# Patient Record
Sex: Male | Born: 1970 | Race: Black or African American | Hispanic: No | Marital: Single | Smoking: Former smoker
Health system: Southern US, Community
[De-identification: ages and names within clinical notes are randomized; demographics above are authoritative.]

## PROBLEM LIST (undated history)

## (undated) DIAGNOSIS — C8469 Anaplastic large cell lymphoma, ALK-positive, extranodal and solid organ sites: Secondary | ICD-10-CM

## (undated) DIAGNOSIS — C8475 Anaplastic large cell lymphoma, ALK-negative, lymph nodes of inguinal region and lower limb: Principal | ICD-10-CM

## (undated) DIAGNOSIS — I10 Essential (primary) hypertension: Secondary | ICD-10-CM

## (undated) DIAGNOSIS — E785 Hyperlipidemia, unspecified: Secondary | ICD-10-CM

## (undated) DIAGNOSIS — Z113 Encounter for screening for infections with a predominantly sexual mode of transmission: Secondary | ICD-10-CM

## (undated) DIAGNOSIS — W3400XA Accidental discharge from unspecified firearms or gun, initial encounter: Secondary | ICD-10-CM

## (undated) DIAGNOSIS — K219 Gastro-esophageal reflux disease without esophagitis: Secondary | ICD-10-CM

## (undated) DIAGNOSIS — M5442 Lumbago with sciatica, left side: Secondary | ICD-10-CM

## (undated) DIAGNOSIS — C8449 Peripheral T-cell lymphoma, not classified, extranodal and solid organ sites: Secondary | ICD-10-CM

## (undated) DIAGNOSIS — G8929 Other chronic pain: Secondary | ICD-10-CM

## (undated) DIAGNOSIS — I70213 Atherosclerosis of native arteries of extremities with intermittent claudication, bilateral legs: Secondary | ICD-10-CM

## (undated) HISTORY — PX: COLON RESECTION: SHX5231

---

## 2014-10-31 ENCOUNTER — Inpatient Hospital Stay: Admit: 2014-10-31 | Payer: MEDICARE | Primary: Internal Medicine

## 2014-10-31 ENCOUNTER — Ambulatory Visit
Admit: 2014-10-31 | Discharge: 2014-10-31 | Payer: PRIVATE HEALTH INSURANCE | Attending: Internal Medicine | Primary: Internal Medicine

## 2014-10-31 DIAGNOSIS — M5441 Lumbago with sciatica, right side: Secondary | ICD-10-CM

## 2014-10-31 DIAGNOSIS — G8929 Other chronic pain: Secondary | ICD-10-CM

## 2014-10-31 MED ORDER — HYDROCODONE-ACETAMINOPHEN 7.5 MG-325 MG TAB
ORAL_TABLET | Freq: Three times a day (TID) | ORAL | Status: DC | PRN
Start: 2014-10-31 — End: 2014-10-31

## 2014-10-31 MED ORDER — HYDROCODONE-ACETAMINOPHEN 7.5 MG-325 MG TAB
ORAL_TABLET | Freq: Three times a day (TID) | ORAL | Status: DC | PRN
Start: 2014-10-31 — End: 2015-02-06

## 2014-10-31 MED ORDER — CYCLOBENZAPRINE 10 MG TAB
10 mg | ORAL_TABLET | Freq: Three times a day (TID) | ORAL | Status: DC
Start: 2014-10-31 — End: 2014-10-31

## 2014-10-31 MED ORDER — CYCLOBENZAPRINE 10 MG TAB
10 mg | ORAL_TABLET | Freq: Three times a day (TID) | ORAL | Status: DC
Start: 2014-10-31 — End: 2015-02-12

## 2014-10-31 MED ORDER — OMEPRAZOLE 40 MG CAP, DELAYED RELEASE
40 mg | ORAL_CAPSULE | Freq: Every day | ORAL | Status: DC
Start: 2014-10-31 — End: 2015-06-12

## 2014-10-31 NOTE — Progress Notes (Signed)
Patient is in the office today to establish care.  Patient states he has been going to the ED to get Toradol injections however the paper that was handed to this nurse was dated 01/04/2014. Patient states he also takes to much tylenol. Patient states was taking Vicodin but this paper was dated 12/22/2010.       Do you have an Advance Directive no  Do you want more information: information given    1. Have you been to the ER, urgent care clinic since your last visit?  Hospitalized since your last visit?yes, [facility] in [place]    2. Have you seen or consulted any other health care providers outside of the [facility] since your last visit?  Include any pap smears or colon screening. No
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
## 2014-10-31 NOTE — Patient Instructions (Addendum)
Advance Directives: Care Instructions  Your Care Instructions  An advance directive is a legal way to state your wishes at the end of your life. It tells your family and your doctor what to do if you can no longer say what you want.  There are two main types of advance directives. You can change them any time that your wishes change.  ?? A living will tells your family and your doctor your wishes about life support and other treatment.  ?? A medical power of attorney lets you name a person to make treatment decisions for you when you can't speak for yourself. This person is called a health care agent.  If you do not have an advance directive, decisions about your medical care may be made by a doctor or a judge who doesn't know you.  It may help to think of an advance directive as a gift to the people who care for you. If you have one, they won't have to make tough decisions by themselves.  Follow-up care is a key part of your treatment and safety. Be sure to make and go to all appointments, and call your doctor if you are having problems. It's also a good idea to know your test results and keep a list of the medicines you take.  How can you care for yourself at home?  ?? Discuss your wishes with your loved ones and your doctor. This way, there are no surprises.  ?? Many states have a unique form. Or you might use a universal form that has been approved by many states. This kind of form can sometimes be completed and stored online. Your electronic copy will then be available wherever you have a connection to the Internet. In most cases, doctors will respect your wishes even if you have a form from a different state.  ?? You don't need a lawyer to do an advance directive. But you may want to get legal advice.  ?? Think about these questions when you prepare an advance directive:  ?? Who do you want to make decisions about your medical care if you are not able to? Many people choose a family member, close friend, or doctor.   ?? Do you know enough about life support methods that might be used? If not, talk to your doctor so you understand.  ?? What are you most afraid of that might happen? You might be afraid of having pain, losing your independence, or being kept alive by machines.  ?? Where would you prefer to die? Choices include your home, a hospital, or a nursing home.  ?? Would you like to have information about hospice care to support you and your family?  ?? Do you want to donate organs when you die?  ?? Do you want certain religious practices performed before you die? If so, put your wishes in the advance directive.  ?? Read your advance directive every year, and make changes as needed.  When should you call for help?  Be sure to contact your doctor if you have any questions.  Where can you learn more?  Go to [email or web address]  Enter R264 in the search box to learn more about "Advance Directives: Care Instructions."  ?? 2006-2016 [employer]. Care instructions adapted under license by [employer] (which disclaims liability or warranty for this information). This care instruction is for use with your licensed healthcare professional. If you have questions about a medical condition or this instruction, always ask your  healthcare professional. Healthwise, Incorporated disclaims any warranty or liability for your use of this information.  Content Version: 10.9.538570; Current as of: May 21, 2014

## 2014-11-01 LAB — METABOLIC PANEL, COMPREHENSIVE
A-G Ratio: 1 (ref 0.8–1.7)
ALT (SGPT): 27 U/L (ref 16–61)
AST (SGOT): 17 U/L (ref 15–37)
Albumin: 3.8 g/dL (ref 3.4–5.0)
Alk. phosphatase: 78 U/L (ref 45–117)
Anion gap: 10 mmol/L (ref 3.0–18)
BUN/Creatinine ratio: 11 — ABNORMAL LOW (ref 12–20)
BUN: 14 MG/DL (ref 7.0–18)
Bilirubin, total: 0.2 MG/DL (ref 0.2–1.0)
CO2: 19 mmol/L — ABNORMAL LOW (ref 21–32)
Calcium: 9.2 MG/DL (ref 8.5–10.1)
Chloride: 110 mmol/L — ABNORMAL HIGH (ref 100–108)
Creatinine: 1.26 MG/DL (ref 0.6–1.3)
GFR est AA: >60 mL/min/{1.73_m2} (ref >60)
GFR est non-AA: >60 mL/min/{1.73_m2} (ref >60)
Globulin: 3.9 g/dL (ref 2.0–4.0)
Glucose: 73 mg/dL — ABNORMAL LOW (ref 74–99)
Potassium: 4.5 mmol/L (ref 3.5–5.5)
Protein, total: 7.7 g/dL (ref 6.4–8.2)
Sodium: 139 mmol/L (ref 136–145)

## 2014-11-07 LAB — 13-DRUG SCREEN, UR
Amphetamines, urine: NEGATIVE ng/mL
Barbiturates: NEGATIVE ng/mL
Benzodiazepines: NEGATIVE ng/mL
Cocaine metabolites: NEGATIVE ng/mL
Creatinine, urine: 94.1 mg/dL (ref 20.0–300.0)
Meperidine: NEGATIVE ng/mL
Methadone: NEGATIVE ng/mL
Opiates: NEGATIVE ng/mL
Oxycodone/Oxymorphone, urine: NEGATIVE ng/mL
Phencyclidine: NEGATIVE ng/mL
Propoxyphene: NEGATIVE ng/mL
Tramadol: NEGATIVE ng/mL

## 2014-11-07 LAB — CANNABINOIDS CONFIRM
Cannabinoids confirm, urine: POSITIVE — AB
Carboxy THC GC/MS confirm: 168 ng/mL

## 2014-11-07 LAB — ETHANOL, CONFIRM: Ethanol: 0.055 %

## 2014-11-28 ENCOUNTER — Encounter: Attending: Internal Medicine | Primary: Internal Medicine

## 2014-12-05 ENCOUNTER — Encounter: Attending: Internal Medicine | Primary: Internal Medicine

## 2014-12-31 ENCOUNTER — Encounter: Attending: Internal Medicine | Primary: Internal Medicine

## 2015-02-06 ENCOUNTER — Ambulatory Visit
Admit: 2015-02-06 | Discharge: 2015-02-06 | Payer: PRIVATE HEALTH INSURANCE | Attending: Internal Medicine | Primary: Internal Medicine

## 2015-02-06 DIAGNOSIS — G8929 Other chronic pain: Secondary | ICD-10-CM

## 2015-02-06 MED ORDER — HYDROCODONE-ACETAMINOPHEN 7.5 MG-325 MG TAB
ORAL_TABLET | Freq: Three times a day (TID) | ORAL | 0 refills | Status: DC | PRN
Start: 2015-02-06 — End: 2015-06-12

## 2015-02-06 NOTE — Progress Notes (Signed)
Patient is in the office today for a 3 week follow up.  Patient states he is having back pain 9/10.    Do you have an Advance Directive no  Do you want more information no    1. Have you been to the ER, urgent care clinic since your last visit?  Hospitalized since your last visit?No    2. Have you seen or consulted any other health care providers outside of the [facility] since your last visit?  Include any pap smears or colon screening. No

## 2015-02-06 NOTE — Progress Notes (Signed)
Dean Walker is a 44 y.o.  male and presents with Back Pain (f/u) and GERD (takes 1-2 prilosec/day )      SUBJECTIVE:  Osteoarthritis and Chronic Pain:  Patient has osteoarthritis, primarily affecting the back.   Symptoms onset: problem is longstanding.  Rheumatological ROS: using narcotic analgesics regularly daily in stable pattern with good pain control and good quality of life.   Response to treatment plan: stable. Pt ran out of Norco but he normally takes 3 tabs/day with good pain control.     Pt's GERD is well controlled Prilosec       Respiratory ROS: negative for - shortness of breath  Cardiovascular ROS: negative for - chest pain    Current Outpatient Prescriptions   Medication Sig   ??? HYDROcodone-acetaminophen (NORCO) 7.5-325 mg per tablet Take 1 Tab by mouth every eight (8) hours as needed for Pain. Max Daily Amount: 3 Tabs.   ??? omeprazole (PRILOSEC) 40 mg capsule Take 1 Cap by mouth daily.   ??? tiZANidine (ZANAFLEX) 4 mg tablet Take 1 Tab by mouth every six (6) hours as needed.     No current facility-administered medications for this visit.          OBJECTIVE:  alert, well appearing, and in no distress  Visit Vitals   ??? BP 110/60 (BP 1 Location: Left arm, BP Patient Position: Sitting)   ??? Pulse (!) 106   ??? Temp 98 ??F (36.7 ??C) (Oral)   ??? Ht 5\' 6"  (1.676 m)   ??? Wt 170 lb (77.1 kg)   ??? BMI 27.44 kg/m2      well developed and well nourished        Assessment/Plan      ICD-10-CM ICD-9-CM    1. Chronic midline low back pain with right-sided sciatica M54.41 724.2 Stable on HYDROcodone-acetaminophen (NORCO) 7.5-325 mg per tablet    G89.29 724.3 Pt again advised he cannot use marijuana and take narcotics.      338.29    2. Gastroesophageal reflux disease without esophagitis K21.9 530.81 Well controlled on Prilosec 1-2 tabs/day      Follow-up Disposition:  Return in about 3 months (around 05/09/2015).    Reviewed plan of care. Patient has provided input and agrees with goals.

## 2015-02-06 NOTE — Patient Instructions (Signed)
Learning About How to Have a Healthy Back  What causes back pain?     Back pain is often caused by overuse, strain, or injury. For example, people often hurt their backs playing sports or working in the yard, being jolted in a car accident, or lifting something too heavy.  Aging plays a part too. Your bones and muscles tend to lose strength as you age, which makes injury more likely. The spongy discs between the bones of the spine (vertebrae) may suffer from wear and tear and no longer provide enough cushion between the bones. A disc that bulges or breaks open (herniated disc) can press on nerves, causing back pain.  In some people, back pain is the result of arthritis, broken vertebrae caused by bone loss (osteoporosis), illness, or a spine problem.  Although most people have back pain at one time or another, there are steps you can take to make it less likely.  How can you have a healthy back?  Reduce stress on your back through good posture   Slumping or slouching alone may not cause low back pain. But after the back has been strained or injured, bad posture can make pain worse.  ?? Sleep in a position that maintains your back's normal curves and on a mattress that feels comfortable. Sleep on your side with a pillow between your knees, or sleep on your back with a pillow under your knees. These positions can reduce strain on your back.  ?? Stand and sit up straight. "Good posture" generally means your ears, shoulders, and hips are in a straight line.  ?? If you must stand for a long time, put one foot on a stool, ledge, or box. Switch feet every now and then.  ?? Sit in a chair that is low enough to let you place both feet flat on the floor with both knees nearly level with your hips. If your chair or desk is too high, use a footrest to raise your knees. Place a small pillow, a rolled-up towel, or a lumbar roll in the curve of your back if you need extra support.   ?? Try a kneeling chair, which helps tilt your hips forward. This takes pressure off your lower back.  ?? Try sitting on an exercise ball. It can rock from side to side, which helps keep your back loose.  ?? When driving, keep your knees nearly level with your hips. Sit straight, and drive with both hands on the steering wheel. Your arms should be in a slightly bent position.  Reduce stress on your back through careful lifting   ?? Squat down, bending at the hips and knees only. If you need to, put one knee to the floor and extend your other knee in front of you, bent at a right angle (half kneeling).  ?? Press your chest straight forward. This helps keep your upper back straight while keeping a slight arch in your low back.  ?? Hold the load as close to your body as possible, at the level of your belly button (navel).  ?? Use your feet to change direction, taking small steps.  ?? Lead with your hips as you change direction. Keep your shoulders in line with your hips as you move.  ?? Set down your load carefully, squatting with your knees and hips only.  Exercise and stretch your back   ?? Do some exercise on most days of the week, if your doctor says it is okay. You can   walk, run, swim, or cycle.  ?? Stretch your back muscles. Here are a few exercises to try:  ?? Lie on your back, and gently pull one bent knee to your chest. Put that foot back on the floor, and then pull the other knee to your chest.  ?? Do pelvic tilts. Lie on your back with your knees bent. Tighten your stomach muscles. Pull your belly button (navel) in and up toward your ribs. You should feel like your back is pressing to the floor and your hips and pelvis are slightly lifting off the floor. Hold for 6 seconds while breathing smoothly.  ?? Sit with your back flat against a wall.  ?? Keep your core muscles strong. The muscles of your back, belly (abdomen), and buttocks support your spine.  ?? Pull in your belly and imagine pulling your navel toward your spine.  Hold this for 6 seconds, then relax. Remember to keep breathing normally as you tense your muscles.  ?? Do curl-ups. Always do them with your knees bent. Keep your low back on the floor, and curl your shoulders toward your knees using a smooth, slow motion. Keep your arms folded across your chest. If this bothers your neck, try putting your hands behind your neck (not your head), with your elbows spread apart.  ?? Lie on your back with your knees bent and your feet flat on the floor. Tighten your belly muscles, and then push with your feet and raise your buttocks up a few inches. Hold this position 6 seconds as you continue to breathe normally, then lower yourself slowly to the floor. Repeat 8 to 12 times.  ?? If you like group exercise, try Pilates or yoga. These classes have poses that strengthen the core muscles.  Lead a healthy lifestyle   ?? Stay at a healthy weight to avoid strain on your back.  ?? Do not smoke. Smoking increases the risk of osteoporosis, which weakens the spine. If you need help quitting, talk to your doctor about stop-smoking programs and medicines. These can increase your chances of quitting for good.  Where can you learn more?  Go to http://www.healthwise.net/[employer]  Enter L315 in the search box to learn more about "Learning About How to Have a Healthy Back."  ?? 2006-2016 [employer], [employer]. Care instructions adapted under license by [employer] (which disclaims liability or warranty for this information). This care instruction is for use with your licensed healthcare professional. If you have questions about a medical condition or this instruction, always ask your healthcare professional. [employer]wise, Incorporated disclaims any warranty or liability for your use of this information.  Content Version: 11.0.578772; Current as of: Aug 18, 2014

## 2015-02-12 MED ORDER — TIZANIDINE 4 MG TAB
4 mg | ORAL_TABLET | Freq: Four times a day (QID) | ORAL | 3 refills | Status: DC | PRN
Start: 2015-02-12 — End: 2015-06-12

## 2015-02-12 NOTE — Telephone Encounter (Signed)
Pt states insurance company will not cover the flexeril script, but they are requesting a replacement of the medication with Tizanidine hcl 4 mg tab     Please send to [facility] [place]  [address]  [phone]

## 2015-05-08 ENCOUNTER — Encounter: Attending: Internal Medicine | Primary: Internal Medicine

## 2015-06-12 ENCOUNTER — Ambulatory Visit
Admit: 2015-06-12 | Discharge: 2015-06-12 | Payer: PRIVATE HEALTH INSURANCE | Attending: Internal Medicine | Primary: Internal Medicine

## 2015-06-12 DIAGNOSIS — G8929 Other chronic pain: Secondary | ICD-10-CM

## 2015-06-12 MED ORDER — HYDROCODONE-ACETAMINOPHEN 7.5 MG-325 MG TAB
ORAL_TABLET | Freq: Three times a day (TID) | ORAL | 0 refills | Status: DC | PRN
Start: 2015-06-12 — End: 2016-01-15

## 2015-06-12 MED ORDER — OMEPRAZOLE 40 MG CAP, DELAYED RELEASE
40 mg | ORAL_CAPSULE | Freq: Every day | ORAL | 2 refills | Status: DC
Start: 2015-06-12 — End: 2016-01-08

## 2015-06-12 MED ORDER — TIZANIDINE 4 MG TAB
4 mg | ORAL_TABLET | Freq: Four times a day (QID) | ORAL | 3 refills | Status: DC | PRN
Start: 2015-06-12 — End: 2016-01-08

## 2015-06-12 NOTE — Progress Notes (Signed)
Dean Walker is a 45 y.o.  male and presents with Back Pain (f/u)      SUBJECTIVE:  Osteoarthritis and Chronic Pain:  Patient has osteoarthritis, primarily affecting the back.   Symptoms onset: problem is longstanding.  Rheumatological ROS: using narcotic analgesics regularly daily in stable pattern with good pain control and good quality of life.   Response to treatment plan: stable. Pt ran out of Norco but he normally takes 3 tabs/day with good pain control.  PMP reviewed and is consistent      Pt's GERD is well controlled Prilosec     Pt continues to smoke tobacco. He has been counseled on the need to stop and options available.       Respiratory ROS: negative for - shortness of breath  Cardiovascular ROS: negative for - chest pain    Current Outpatient Prescriptions   Medication Sig   ??? omeprazole (PRILOSEC) 40 mg capsule Take 1 Cap by mouth daily.   ??? tiZANidine (ZANAFLEX) 4 mg tablet Take 1 Tab by mouth every six (6) hours as needed.   ??? HYDROcodone-acetaminophen (NORCO) 7.5-325 mg per tablet Take 1 Tab by mouth every eight (8) hours as needed for Pain. Max Daily Amount: 3 Tabs.     No current facility-administered medications for this visit.          OBJECTIVE:  alert, well appearing, and in no distress  Visit Vitals   ??? BP 121/68 (BP 1 Location: Left arm, BP Patient Position: Sitting)   ??? Pulse (!) 102   ??? Temp 98.3 ??F (36.8 ??C) (Oral)   ??? Resp 18   ??? Ht 5\' 6"  (1.676 m)   ??? Wt 166 lb (75.3 kg)   ??? SpO2 99%   ??? BMI 26.79 kg/m2      well developed and well nourished  Chest - clear to auscultation, no wheezes, rales or rhonchi, symmetric air entry  Heart - normal rate, regular rhythm, normal S1, S2, no murmurs, rubs, clicks or gallops  Extremities - peripheral pulses normal, no pedal edema, no clubbing or cyanosis        Assessment/Plan      ICD-10-CM ICD-9-CM    1. Chronic midline low back pain with right-sided sciatica M54.41 724.2 controlled on HYDROcodone-acetaminophen (NORCO) 7.5-325 mg per tablet     G89.29 724.3      338.29    2. Gastroesophageal reflux disease without esophagitis K21.9 530.81 Well controlled on omeprazole (PRILOSEC) 40 mg capsule   3. Tobacco dependence F17.200 305.1 Pt counseled on the need to stop and options available.         Follow-up Disposition:  Return in about 4 months (around 10/12/2015) for OV, and Medicare Wellness Visit.    Reviewed plan of care. Patient has provided input and agrees with goals.

## 2015-06-12 NOTE — Patient Instructions (Signed)
Learning About How to Have a Healthy Back  What causes back pain?    Back pain is often caused by overuse, strain, or injury. For example, people often hurt their backs playing sports or working in the yard, being jolted in a car accident, or lifting something too heavy.  Aging plays a part too. Your bones and muscles tend to lose strength as you age, which makes injury more likely. The spongy discs between the bones of the spine (vertebrae) may suffer from wear and tear and no longer provide enough cushion between the bones. A disc that bulges or breaks open (herniated disc) can press on nerves, causing back pain.  In some people, back pain is the result of arthritis, broken vertebrae caused by bone loss (osteoporosis), illness, or a spine problem.  Although most people have back pain at one time or another, there are steps you can take to make it less likely.  How can you have a healthy back?  Reduce stress on your back through good posture  Slumping or slouching alone may not cause low back pain. But after the back has been strained or injured, bad posture can make pain worse.  ?? Sleep in a position that maintains your back's normal curves and on a mattress that feels comfortable. Sleep on your side with a pillow between your knees, or sleep on your back with a pillow under your knees. These positions can reduce strain on your back.  ?? Stand and sit up straight. "Good posture" generally means your ears, shoulders, and hips are in a straight line.  ?? If you must stand for a long time, put one foot on a stool, ledge, or box. Switch feet every now and then.  ?? Sit in a chair that is low enough to let you place both feet flat on the floor with both knees nearly level with your hips. If your chair or desk is too high, use a footrest to raise your knees. Place a small pillow, a rolled-up towel, or a lumbar roll in the curve of your back if you need extra support.   ?? Try a kneeling chair, which helps tilt your hips forward. This takes pressure off your lower back.  ?? Try sitting on an exercise ball. It can rock from side to side, which helps keep your back loose.  ?? When driving, keep your knees nearly level with your hips. Sit straight, and drive with both hands on the steering wheel. Your arms should be in a slightly bent position.  Reduce stress on your back through careful lifting  ?? Squat down, bending at the hips and knees only. If you need to, put one knee to the floor and extend your other knee in front of you, bent at a right angle (half kneeling).  ?? Press your chest straight forward. This helps keep your upper back straight while keeping a slight arch in your low back.  ?? Hold the load as close to your body as possible, at the level of your belly button (navel).  ?? Use your feet to change direction, taking small steps.  ?? Lead with your hips as you change direction. Keep your shoulders in line with your hips as you move.  ?? Set down your load carefully, squatting with your knees and hips only.  Exercise and stretch your back  ?? Do some exercise on most days of the week, if your doctor says it is okay. You can walk, run, swim, or   cycle.  ?? Stretch your back muscles. Here are a few exercises to try:  ?? Lie on your back, and gently pull one bent knee to your chest. Put that foot back on the floor, and then pull the other knee to your chest.  ?? Do pelvic tilts. Lie on your back with your knees bent. Tighten your stomach muscles. Pull your belly button (navel) in and up toward your ribs. You should feel like your back is pressing to the floor and your hips and pelvis are slightly lifting off the floor. Hold for 6 seconds while breathing smoothly.  ?? Sit with your back flat against a wall.  ?? Keep your core muscles strong. The muscles of your back, belly (abdomen), and buttocks support your spine.  ?? Pull in your belly and imagine pulling your navel toward your spine.  Hold this for 6 seconds, then relax. Remember to keep breathing normally as you tense your muscles.  ?? Do curl-ups. Always do them with your knees bent. Keep your low back on the floor, and curl your shoulders toward your knees using a smooth, slow motion. Keep your arms folded across your chest. If this bothers your neck, try putting your hands behind your neck (not your head), with your elbows spread apart.  ?? Lie on your back with your knees bent and your feet flat on the floor. Tighten your belly muscles, and then push with your feet and raise your buttocks up a few inches. Hold this position 6 seconds as you continue to breathe normally, then lower yourself slowly to the floor. Repeat 8 to 12 times.  ?? If you like group exercise, try Pilates or yoga. These classes have poses that strengthen the core muscles.  Lead a healthy lifestyle  ?? Stay at a healthy weight to avoid strain on your back.  ?? Do not smoke. Smoking increases the risk of osteoporosis, which weakens the spine. If you need help quitting, talk to your doctor about stop-smoking programs and medicines. These can increase your chances of quitting for good.  Where can you learn more?  Go to http://www.healthwise.net/GoodHelpConnections.  Enter L315 in the search box to learn more about "Learning About How to Have a Healthy Back."  Current as of: Aug 18, 2014  Content Version: 11.1  ?? 2006-2016 Healthwise, Incorporated. Care instructions adapted under license by [employer] (which disclaims liability or warranty for this information). If you have questions about a medical condition or this instruction, always ask your healthcare professional. Healthwise, Incorporated disclaims any warranty or liability for your use of this information.

## 2015-06-12 NOTE — Progress Notes (Signed)
Patient is in the office today for a 3 month follow up.  Patient states he is having back pain 9/10 radiating down left leg.    Do you have an Advance Directive no  Do you want more information declined    1. Have you been to the ER, urgent care clinic since your last visit?  Hospitalized since your last visit?No    2. Have you seen or consulted any other health care providers outside of the [facility] since your last visit?  Include any pap smears or colon screening. No

## 2015-06-12 NOTE — ACP (Advance Care Planning) (Signed)
Patient declined advance directive information.

## 2015-10-16 ENCOUNTER — Encounter: Attending: Internal Medicine | Primary: Internal Medicine

## 2015-11-10 NOTE — Telephone Encounter (Signed)
Pt called stating that he needed to reschedule his appt and wanted to be sure that he would be checked for his prostate.    Pt appt 10/6.    Pt was asked if he get labs drawn here and stated that he normally have the blood work done after the visit.    I notified the pt that he was scheduled for a 4 mos fu.    Should this person have a PSA level checked before appt.

## 2015-11-13 ENCOUNTER — Encounter: Attending: Internal Medicine | Primary: Internal Medicine

## 2015-11-13 NOTE — Telephone Encounter (Signed)
Will do blood work including PSA at his OV which will be a medicare annual wellness

## 2016-01-01 ENCOUNTER — Encounter: Attending: Internal Medicine | Primary: Internal Medicine

## 2016-01-08 ENCOUNTER — Inpatient Hospital Stay: Admit: 2016-01-08 | Payer: MEDICARE | Primary: Internal Medicine

## 2016-01-08 ENCOUNTER — Ambulatory Visit
Admit: 2016-01-08 | Discharge: 2016-01-08 | Payer: PRIVATE HEALTH INSURANCE | Attending: Internal Medicine | Primary: Internal Medicine

## 2016-01-08 ENCOUNTER — Encounter: Attending: Internal Medicine | Primary: Internal Medicine

## 2016-01-08 DIAGNOSIS — Z0000 Encounter for general adult medical examination without abnormal findings: Secondary | ICD-10-CM

## 2016-01-08 MED ORDER — OMEPRAZOLE 40 MG CAP, DELAYED RELEASE
40 mg | ORAL_CAPSULE | Freq: Every day | ORAL | 2 refills | Status: DC
Start: 2016-01-08 — End: 2016-06-17

## 2016-01-08 MED ORDER — TIZANIDINE 4 MG TAB
4 mg | ORAL_TABLET | Freq: Four times a day (QID) | ORAL | 3 refills | Status: DC | PRN
Start: 2016-01-08 — End: 2016-05-18

## 2016-01-08 NOTE — Progress Notes (Signed)
This is an Initial Medicare Annual Wellness Exam (AWV) (Performed 12 months after IPPE or effective date of Medicare Part B enrollment, Once in a lifetime)    I have reviewed the patient's medical history in detail and updated the computerized patient record.     History     Past Medical History:   Diagnosis Date   ??? Hypercholesterolemia    ??? Migraine headache       History reviewed. No pertinent surgical history.  Current Outpatient Prescriptions   Medication Sig Dispense Refill   ??? omeprazole (PRILOSEC) 40 mg capsule Take 1 Cap by mouth daily. 90 Cap 2   ??? tiZANidine (ZANAFLEX) 4 mg tablet Take 1 Tab by mouth every six (6) hours as needed. 90 Tab 3     Allergies   Allergen Reactions   ??? Gabapentin Other (comments)     Caused foot pain   ??? Toradol [Ketorolac] Other (comments)     Patient states he is not allergic to Torodal     Family History   Problem Relation Age of Onset   ??? Diabetes Mother    ??? Hypertension Father      Social History   Substance Use Topics   ??? Smoking status: Current Every Day Smoker     Packs/day: 0.50     Years: 20.00   ??? Smokeless tobacco: Never Used   ??? Alcohol use 7.2 oz/week     12 Cans of beer per week      Comment: 12 pk a week     Patient Active Problem List   Diagnosis Code   ??? Migraine headache G43.909       Depression Risk Factor Screening:     PHQ over the last two weeks 01/08/2016   Little interest or pleasure in doing things Not at all   Feeling down, depressed or hopeless Not at all   Total Score PHQ 2 0     Alcohol Risk Factor Screening:   You do not drink alcohol or very rarely.      Functional Ability and Level of Safety:     Hearing Loss  Hearing is good.    Activities of Daily Living  The home contains: no safety equipment.  Patient does total self care    Fall RiskNo flowsheet data found.    Abuse Screen  Patient is not abused    Cognitive Screening   Evaluation of Cognitive Function:  Has your family/caregiver stated any concerns about your memory: no  Normal     Patient Care Team   Patient Care Team:  Thana Farr, MD as PCP - General (Internal Medicine)    Assessment/Plan   Education and counseling provided:  Are appropriate based on today's review and evaluation  End-of-Life planning (with patient's consent)    Diagnoses and all orders for this visit:    1. Encounter for Medicare annual wellness exam  -     PSA SCREENING (SCREENING); Future  -     LIPID PANEL; Future  -     METABOLIC PANEL, BASIC; Future    2. Gastroesophageal reflux disease without esophagitis  -     omeprazole (PRILOSEC) 40 mg capsule; Take 1 Cap by mouth daily.    3. Chronic midline low back pain with right-sided sciatica    4. Encounter for immunization  -     Influenza virus vaccine (QUADRIVALENT PRES FREE SYRINGE) IM [id number])  -     Administration fee [phone]) for Medicare  insured patients    5. Screening PSA (prostate specific antigen)  -     PSA SCREENING (SCREENING); Future    6. High cholesterol  -     LIPID PANEL; Future  -     METABOLIC PANEL, BASIC; Future    Other orders  -     tiZANidine (ZANAFLEX) 4 mg tablet; Take 1 Tab by mouth every six (6) hours as needed.         Health Maintenance Due   Topic Date Due   ??? Pneumococcal 19-64 Medium Risk (1 of 1 - PPSV23) 02/28/1989   ??? DTaP/Tdap/Td series (1 - Tdap) 03/01/1991     A comprehensive 5 year plan for medical care and screening exams was reviewed with pt and they received a copy of it.

## 2016-01-08 NOTE — Patient Instructions (Addendum)
Learning About Benefits From Quitting Smoking  How does quitting smoking make you healthier?  If you're thinking about quitting smoking, you may have a few reasons to be smoke-free. Your health may be one of them.  ?? When you quit smoking, you lower your risks for cancer, lung disease, heart attack, stroke, blood vessel disease, and blindness from macular degeneration.  ?? When you're smoke-free, you get sick less often, and you heal faster. You are less likely to get colds, flu, bronchitis, and pneumonia.  ?? As a nonsmoker, you may find that your mood is better and you are less stressed.  When and how will you feel healthier?  Quitting has real health benefits that start from day 1 of being smoke-free. And the longer you stay smoke-free, the healthier you get and the better you feel.  The first hours  ?? After just 20 minutes, your blood pressure and heart rate go down. That means there's less stress on your heart and blood vessels.  ?? Within 12 hours, the level of carbon monoxide in your blood drops back to normal. That makes room for more oxygen. With more oxygen in your body, you may notice that you have more energy than when you smoked.  After 2 weeks  ?? Your lungs start to work better.  ?? Your risk of heart attack starts to drop.  After 1 month  ?? When your lungs are clear, you cough less and breathe deeper, so it's easier to be active.  ?? Your sense of taste and smell return. That means you can enjoy food more than you have since you started smoking.  Over the years  ?? After 1 year, your risk of heart disease is half what it would be if you kept smoking.  ?? After 5 years, your risk of stroke starts to shrink. Within a few years after that, it's about the same as if you'd never smoked.  ?? After 10 years, your risk of dying from lung cancer is cut by about half. And your risk for many other types of cancer is lower too.  How would quitting help others in your life?   When you quit smoking, you improve the health of everyone who now breathes in your smoke.  ?? Their heart, lung, and cancer risks drop, much like yours.  ?? They are sick less. For babies and small children, living smoke-free means they're less likely to have ear infections, pneumonia, and bronchitis.  ?? If you're a woman who is or will be pregnant someday, quitting smoking means a healthier newborn.  ?? Children who are close to you are less likely to become adult smokers.  Where can you learn more?  Go to StreetWrestling.at.  Enter O319 in the search box to learn more about "Learning About Benefits From Quitting Smoking."  Current as of: June 15, 2015  Content Version: 11.3  ?? 2006-2017 Healthwise, Incorporated. Care instructions adapted under license by [employer] (which disclaims liability or warranty for this information). If you have questions about a medical condition or this instruction, always ask your healthcare professional. [place] any warranty or liability for your use of this information.    Vaccine Information Statement    Influenza (Flu) Vaccine (Inactivated or Recombinant): What you need to know    Many Vaccine Information Statements are available in Spanish and other languages. See AbsolutelyGenuine.com.br  Hojas de Informaci??n Sobre Vacunas est??n disponibles en Espa??ol y en muchos otros idiomas. Visite AbsolutelyGenuine.com.br  1. Why get vaccinated?    Influenza (???flu???) is a contagious disease that spreads around the [place] every year, usually between October and May.     Flu is caused by influenza viruses, and is spread mainly by coughing, sneezing, and close contact.     Anyone can get flu. Flu strikes suddenly and can last several days. Symptoms vary by age, but can include:  ??? fever/chills  ??? sore throat  ??? muscle aches  ??? fatigue  ??? cough  ??? headache   ??? runny or stuffy nose     Flu can also lead to pneumonia and blood infections, and cause diarrhea and seizures in children.  If you have a medical condition, such as heart or lung disease, flu can make it worse.    Flu is more dangerous for some people. Infants and young children, people 79 years of age and older, pregnant women, and people with certain health conditions or a weakened immune system are at greatest risk.      Each year thousands of people in the [place] die from flu, and many more are hospitalized.     Flu vaccine can:  ??? keep you from getting flu,  ??? make flu less severe if you do get it, and  ??? keep you from spreading flu to your family and other people.     2. Inactivated and recombinant flu vaccines    A dose of flu vaccine is recommended every flu season. Children 6 months through 47 years of age may need two doses during the same flu season.  Everyone else needs only one dose each flu season.       Some inactivated flu vaccines contain a very small amount of a mercury-based preservative called thimerosal. Studies have not shown thimerosal in vaccines to be harmful, but flu vaccines that do not contain thimerosal are available.    There is no live flu virus in flu shots.  They cannot cause the flu.     There are many flu viruses, and they are always changing. Each year a new flu vaccine is made to protect against three or four viruses that are likely to cause disease in the upcoming flu season. But even when the vaccine doesn???t exactly match these viruses, it may still provide some protection    Flu vaccine cannot prevent:  ??? flu that is caused by a virus not covered by the vaccine, or  ??? illnesses that look like flu but are not.    It takes about 2 weeks for protection to develop after vaccination, and protection lasts through the flu season.     3. Some people should not get this vaccine    Tell the person who is giving you the vaccine:    ??? If you have any severe, life-threatening allergies.     If you ever had a life-threatening allergic reaction after a dose of flu vaccine, or have a severe allergy to any part of this vaccine, you may be advised not to get vaccinated.  Most, but not all, types of flu vaccine contain a small amount of egg protein.       ??? If you ever had Guillain-Barr?? Syndrome (also called GBS).   Some people with a history of GBS should not get this vaccine. This should be discussed with your doctor.    ??? If you are not feeling well.    It is usually okay to get flu vaccine when you have  a mild illness, but you might be asked to come back when you feel better.      4. Risks of a vaccine reaction    With any medicine, including vaccines, there is a chance of reactions. These are usually mild and go away on their own, but serious reactions are also possible.     Most people who get a flu shot do not have any problems with it.     Minor problems following a flu shot include:   ??? soreness, redness, or swelling where the shot was given    ??? hoarseness  ??? sore, red or itchy eyes  ??? cough  ??? fever  ??? aches  ??? headache  ??? itching  ??? fatigue  If these problems occur, they usually begin soon after the shot and last 1 or 2 days.     More serious problems following a flu shot can include the following:    ??? There may be a small increased risk of Guillain-Barr?? Syndrome (GBS) after inactivated flu vaccine.  This risk has been estimated at 1 or 2 additional cases per million people vaccinated. This is much lower than the risk of severe complications from flu, which can be prevented by flu vaccine.      ??? Young children who get the flu shot along with pneumococcal vaccine (PCV13) and/or DTaP vaccine at the same time might be slightly more likely to have a seizure caused by fever. Ask your doctor for more information. Tell your doctor if a child who is getting flu vaccine has ever had a seizure.     Problems that could happen after any injected vaccine:      ??? People sometimes faint after a medical procedure, including vaccination. Sitting or lying down for about 15 minutes can help prevent fainting, and injuries caused by a fall. Tell your doctor if you feel dizzy, or have vision changes or ringing in the ears.    ??? Some people get severe pain in the shoulder and have difficulty moving the arm where a shot was given. This happens very rarely.    ??? Any medication can cause a severe allergic reaction. Such reactions from a vaccine are very rare, estimated at about 1 in a million doses, and would happen within a few minutes to a few hours after the vaccination.    As with any medicine, there is a very remote chance of a vaccine causing a serious injury or death.    The safety of vaccines is always being monitored. For more information, visit: http://www.aguilar.org/    5. What if there is a serious reaction?    What should I look for?    ??? Look for anything that concerns you, such as signs of a severe allergic reaction, very high fever, or unusual behavior.    Signs of a severe allergic reaction can include hives, swelling of the face and throat, difficulty breathing, a fast heartbeat, dizziness, and weakness ??? usually within a few minutes to a few hours after the vaccination.    What should I do?    ??? If you think it is a severe allergic reaction or other emergency that can???t wait, call 9-1-1 and get the person to the nearest hospital. Otherwise, call your doctor.    ??? Reactions should be reported to the Vaccine Adverse Event Reporting System (VAERS). Your doctor should file this report, or you can do it yourself through  the VAERS web site at www.vaers.SamedayNews.es, or  by calling 920-409-0447.    VAERS does not give medical advice.    6. The National Vaccine Injury Compensation Program    The [employer] Vaccine Injury Compensation Program (VICP) is a federal program that was created to compensate people who may have been injured by certain vaccines.     Persons who believe they may have been injured by a vaccine can learn about the program and about filing a claim by calling ([phone] or visiting the [place] website at GoldCloset.com.ee.  There is a time limit to file a claim for compensation.    7. How can I learn more?  ??? Ask your healthcare provider. He or she can give you the vaccine package insert or suggest other sources of information.  ??? Call your local or state health department.  ??? Contact the Centers for Disease Control and Prevention (CDC):  - Call (910)476-3434 (1-800-CDC-INFO) or  - Visit CDC???s website at https://gibson.com/    Vaccine Information Statement   Inactivated Influenza Vaccine   11/01/2013  [place] ?? [phone]    Department of Health and [place] for Disease Control and Prevention    Office Use Only    Medicare Wellness Visit, Male    The best way to live healthy is to have a healthy lifestyle by eating a well-balanced diet, exercising regularly, limiting alcohol and stopping smoking.    Regular physical exams and screening tests are another way to keep healthy.   Preventive exams provided by your health care provider can find health problems before they become diseases or illnesses. Preventive services including immunizations, screening tests, monitoring and exams can help you take care of your own health.    All people over age 31 should have a pneumovax  and and a prevnar shot to prevent pneumonia. These are once in a lifetime unless you and your provider decide differently.    All people over 65 should have a yearly flu shot and a tetanus vaccine every 10 years.    Screening for diabetes mellitus with a blood sugar test should be done every year.    Glaucoma is a disease of the eye due to increased ocular pressure that can lead to blindness and it should be done every year by an eye professional.    Cardiovascular screening tests that check for elevated lipids (fatty part  of blood) which can lead to heart disease and strokes should be done every 5 years.    Colorectal screening that evaluates for blood or polyps in your colon should be done yearly as a stool test or every five years as a flexible sigmoidoscope or every 10 years as a colonoscopy up to age 57.    Men up to age 96 may need a screening blood test for prostate cancer at certain intervals, depending on their personal and family history. This decision is between the patient and his provider.    If you have been a smoker or had family history of abdominal aortic aneurysms, you and your provider may decide to schedule an ultrasound test of your aorta.    Hepatitis C screening is also recommended for anyone born between [birth year] through 1965.    A shingles vaccine is also recommended once in a lifetime after age 71.    Your Medicare Wellness Exam is recommended annually.    Here is a list of your current Health Maintenance items with a due date:  Health Maintenance Due   Topic Date Due   ???  DTaP/Tdap/Td  (1 - Tdap) 03/01/1991   ??? Flu Vaccine  10/27/2015

## 2016-01-08 NOTE — Progress Notes (Signed)
Dean Walker is a 45 y.o.  male and presents with Follow-up; Headache; Annual Wellness Visit; and Drug Screen (stop Norco due to ETOH and Marijuan use )      SUBJECTIVE:  Osteoarthritis and Chronic Pain:  Patient has osteoarthritis, primarily affecting the back.   Symptoms onset: problem is longstanding.  Rheumatological ROS: using narcotic analgesics regularly daily in stable pattern with good pain control and good quality of life.   Response to treatment plan: pt is using ETOH and marijuana so will stop Norco at this time and use NSAIDs and or refer to Pain Clinic     Pt's GERD is well controlled Prilosec     Pt continues to smoke tobacco. He has been counseled on the need to stop and options available.       Respiratory ROS: negative for - shortness of breath  Cardiovascular ROS: negative for - chest pain    Current Outpatient Prescriptions   Medication Sig   ??? omeprazole (PRILOSEC) 40 mg capsule Take 1 Cap by mouth daily.   ??? tiZANidine (ZANAFLEX) 4 mg tablet Take 1 Tab by mouth every six (6) hours as needed.     No current facility-administered medications for this visit.          OBJECTIVE:  alert, well appearing, and in no distress  Visit Vitals   ??? BP (!) 131/92   ??? Pulse 98   ??? Temp 97.6 ??F (36.4 ??C) (Oral)   ??? Resp 16   ??? Ht 5\' 6"  (1.676 m)   ??? Wt 161 lb 6.4 oz (73.2 kg)   ??? SpO2 99%   ??? BMI 26.05 kg/m2      well developed and well nourished  Chest - clear to auscultation, no wheezes, rales or rhonchi, symmetric air entry  Heart - normal rate, regular rhythm, normal S1, S2, no murmurs, rubs, clicks or gallops  Extremities - peripheral pulses normal, no pedal edema, no clubbing or cyanosis        Assessment/Plan      ICD-10-CM ICD-9-CM    1. Encounter for Medicare annual wellness exam Z00.00 V70.0 PSA SCREENING (SCREENING)      LIPID PANEL      METABOLIC PANEL, BASIC   2. Gastroesophageal reflux disease without esophagitis K21.9 530.81 Well controlled on omeprazole (PRILOSEC) 40 mg capsule    3. Chronic midline low back pain with right-sided sciatica M54.41 724.2 Will stop Norco due to ETOH and marijuana use. Refer to Pain Clinic if needed     G89.29 724.3      338.29    4. Encounter for immunization Z23 V03.89 INFLUENZA VIRUS VAC QUAD,SPLIT,PRESV FREE SYRINGE IM      ADMIN INFLUENZA VIRUS VAC   5. Screening PSA (prostate specific antigen) Z12.5 V76.44 PSA SCREENING (SCREENING)   6. High cholesterol E78.00 272.0 LIPID PANEL      METABOLIC PANEL, BASIC       Follow-up Disposition:  Return in about 6 months (around 07/08/2016).    Reviewed plan of care. Patient has provided input and agrees with goals.

## 2016-01-08 NOTE — Progress Notes (Signed)
Tell patient his/her blood work is good including prostate

## 2016-01-08 NOTE — Progress Notes (Signed)
Pt is here for 4 month follow up Migraines, Back & L leg pain.    Labs to be done today @ [facility]      1. Have you been to the ER, urgent care clinic since your last visit?  Hospitalized since your last visit?No    2. Have you seen or consulted any other health care providers outside of the [facility] since your last visit?  Include any pap smears or colon screening. No      Pt states he have platelets today.

## 2016-01-09 LAB — METABOLIC PANEL, BASIC
Anion gap: 10 mmol/L (ref 3.0–18)
BUN/Creatinine ratio: 10 — ABNORMAL LOW (ref 12–20)
BUN: 13 MG/DL (ref 7.0–18)
CO2: 23 mmol/L (ref 21–32)
Calcium: 8.9 MG/DL (ref 8.5–10.1)
Chloride: 107 mmol/L (ref 100–108)
Creatinine: 1.26 MG/DL (ref 0.6–1.3)
GFR est AA: >60 mL/min/{1.73_m2} (ref >60)
GFR est non-AA: >60 mL/min/{1.73_m2} (ref >60)
Glucose: 89 mg/dL (ref 74–99)
Potassium: 4.7 mmol/L (ref 3.5–5.5)
Sodium: 140 mmol/L (ref 136–145)

## 2016-01-09 LAB — PSA SCREENING (SCREENING): Prostate Specific Ag: 0.8 ng/mL (ref 0.0–4.0)

## 2016-01-09 LAB — LIPID PANEL
CHOL/HDL Ratio: 4.4 (ref 0–5.0)
Cholesterol, total: 157 MG/DL (ref <200)
HDL Cholesterol: 36 MG/DL — ABNORMAL LOW (ref 40–60)
LDL, calculated: 80.4 MG/DL (ref 0–100)
Triglyceride: 203 MG/DL — ABNORMAL HIGH (ref <150)
VLDL, calculated: 40.6 MG/DL

## 2016-01-15 NOTE — Telephone Encounter (Signed)
I can give him Motrin if he needs a narcotic he will have to go to Pain Management

## 2016-01-15 NOTE — Telephone Encounter (Signed)
LMOM for patient to return call.  All labs are normal including prostate

## 2016-01-15 NOTE — Telephone Encounter (Signed)
-----   Message from Thana Farr, MD sent at 01/12/2016  7:01 AM EDT -----  Tell patient his/her blood work is good including prostate

## 2016-01-15 NOTE — Telephone Encounter (Signed)
Pt called back told results was normal now want Dr. Madelyn Brunner to give him something for pain please advise

## 2016-01-18 NOTE — Telephone Encounter (Signed)
Called to give patient Dr. Madelyn Brunner message. Line busy.

## 2016-01-19 NOTE — Telephone Encounter (Signed)
LMOM for patient to return call.

## 2016-01-20 MED ORDER — IBUPROFEN 800 MG TAB
800 mg | ORAL_TABLET | Freq: Three times a day (TID) | ORAL | 2 refills | Status: DC | PRN
Start: 2016-01-20 — End: 2016-06-23

## 2016-01-20 NOTE — Telephone Encounter (Signed)
Pt returned call and given Dr Lionel December message    He would like Dr Madelyn Brunner to prescribe Motrin    Send to [employer]

## 2016-01-20 NOTE — Telephone Encounter (Signed)
Done

## 2016-05-18 MED ORDER — TIZANIDINE 4 MG TAB
4 mg | ORAL_TABLET | ORAL | 0 refills | Status: DC
Start: 2016-05-18 — End: 2016-05-24

## 2016-05-24 NOTE — Telephone Encounter (Signed)
Last OV 01/08/2016  No future appointments scheduled.   Last Refill 05/18/2016

## 2016-05-28 MED ORDER — TIZANIDINE 4 MG TAB
4 mg | ORAL_TABLET | ORAL | 0 refills | Status: DC
Start: 2016-05-28 — End: 2016-06-16

## 2016-06-16 MED ORDER — TIZANIDINE 4 MG TAB
4 mg | ORAL_TABLET | ORAL | 0 refills | Status: DC
Start: 2016-06-16 — End: 2016-08-13

## 2016-06-17 ENCOUNTER — Encounter

## 2016-06-17 NOTE — Telephone Encounter (Signed)
Pt is requesting med refill

## 2016-06-24 MED ORDER — IBUPROFEN 800 MG TAB
800 mg | ORAL_TABLET | ORAL | 0 refills | Status: DC
Start: 2016-06-24 — End: 2016-07-18

## 2016-07-19 MED ORDER — IBUPROFEN 800 MG TAB
800 mg | ORAL_TABLET | Freq: Three times a day (TID) | ORAL | 2 refills | Status: DC | PRN
Start: 2016-07-19 — End: 2016-07-29

## 2016-07-19 MED ORDER — OMEPRAZOLE 40 MG CAP, DELAYED RELEASE
40 mg | ORAL_CAPSULE | Freq: Every day | ORAL | 2 refills | Status: DC
Start: 2016-07-19 — End: 2016-10-24

## 2016-07-29 MED ORDER — IBUPROFEN 800 MG TAB
800 mg | ORAL_TABLET | ORAL | 0 refills | Status: DC
Start: 2016-07-29 — End: 2016-10-20

## 2016-08-13 MED ORDER — TIZANIDINE 4 MG TAB
4 mg | ORAL_TABLET | ORAL | 0 refills | Status: DC
Start: 2016-08-13 — End: 2016-09-21

## 2016-09-21 MED ORDER — TIZANIDINE 4 MG TAB
4 mg | ORAL_TABLET | ORAL | 0 refills | Status: DC
Start: 2016-09-21 — End: 2016-09-29

## 2016-09-29 MED ORDER — TIZANIDINE 4 MG TAB
4 mg | ORAL_TABLET | ORAL | 0 refills | Status: DC
Start: 2016-09-29 — End: 2016-10-15

## 2016-10-15 MED ORDER — TIZANIDINE 4 MG TAB
4 mg | ORAL_TABLET | ORAL | 0 refills | Status: DC
Start: 2016-10-15 — End: 2016-11-13

## 2016-10-20 MED ORDER — IBUPROFEN 800 MG TAB
800 mg | ORAL_TABLET | ORAL | 2 refills | Status: DC
Start: 2016-10-20 — End: 2017-01-23

## 2016-10-24 ENCOUNTER — Encounter

## 2016-10-24 MED ORDER — OMEPRAZOLE 40 MG CAP, DELAYED RELEASE
40 mg | ORAL_CAPSULE | ORAL | 0 refills | Status: DC
Start: 2016-10-24 — End: 2017-01-27

## 2016-11-13 MED ORDER — TIZANIDINE 4 MG TAB
4 mg | ORAL_TABLET | ORAL | 2 refills | Status: DC
Start: 2016-11-13 — End: 2017-01-23

## 2016-12-13 ENCOUNTER — Encounter: Attending: Internal Medicine | Primary: Internal Medicine

## 2016-12-19 ENCOUNTER — Encounter: Attending: Internal Medicine | Primary: Internal Medicine

## 2017-01-23 ENCOUNTER — Ambulatory Visit
Admit: 2017-01-23 | Discharge: 2017-01-23 | Payer: PRIVATE HEALTH INSURANCE | Attending: Internal Medicine | Primary: Internal Medicine

## 2017-01-23 ENCOUNTER — Inpatient Hospital Stay: Admit: 2017-01-23 | Payer: MEDICARE | Primary: Internal Medicine

## 2017-01-23 DIAGNOSIS — Z0000 Encounter for general adult medical examination without abnormal findings: Secondary | ICD-10-CM

## 2017-01-23 DIAGNOSIS — Z125 Encounter for screening for malignant neoplasm of prostate: Secondary | ICD-10-CM

## 2017-01-23 LAB — CBC WITH AUTOMATED DIFF
ABS. BASOPHILS: 0 10*3/uL (ref 0.0–0.1)
ABS. EOSINOPHILS: 0.1 10*3/uL (ref 0.0–0.4)
ABS. LYMPHOCYTES: 1.9 10*3/uL (ref 0.9–3.6)
ABS. MONOCYTES: 1.7 10*3/uL — ABNORMAL HIGH (ref 0.05–1.2)
ABS. NEUTROPHILS: 8.4 10*3/uL — ABNORMAL HIGH (ref 1.8–8.0)
BASOPHILS: 0 % (ref 0–2)
EOSINOPHILS: 1 % (ref 0–5)
HCT: 45.3 % (ref 36.0–48.0)
HGB: 15.4 g/dL (ref 13.0–16.0)
LYMPHOCYTES: 16 % — ABNORMAL LOW (ref 21–52)
MCH: 35.1 PG — ABNORMAL HIGH (ref 24.0–34.0)
MCHC: 34 g/dL (ref 31.0–37.0)
MCV: 103.2 FL — ABNORMAL HIGH (ref 74.0–97.0)
MONOCYTES: 14 % — ABNORMAL HIGH (ref 3–10)
MPV: 10.2 FL (ref 9.2–11.8)
NEUTROPHILS: 69 % (ref 40–73)
PLATELET: 329 10*3/uL (ref 135–420)
RBC: 4.39 M/uL — ABNORMAL LOW (ref 4.70–5.50)
RDW: 12.5 % (ref 11.6–14.5)
WBC: 12.1 10*3/uL (ref 4.6–13.2)

## 2017-01-23 LAB — METABOLIC PANEL, COMPREHENSIVE
A-G Ratio: 0.8 (ref 0.8–1.7)
ALT (SGPT): 25 U/L (ref 16–61)
AST (SGOT): 17 U/L (ref 15–37)
Albumin: 3.1 g/dL — ABNORMAL LOW (ref 3.4–5.0)
Alk. phosphatase: 83 U/L (ref 45–117)
Anion gap: 6 mmol/L (ref 3.0–18)
BUN/Creatinine ratio: 11 — ABNORMAL LOW (ref 12–20)
BUN: 13 MG/DL (ref 7.0–18)
Bilirubin, total: 0.3 MG/DL (ref 0.2–1.0)
CO2: 23 mmol/L (ref 21–32)
Calcium: 8.7 MG/DL (ref 8.5–10.1)
Chloride: 108 mmol/L (ref 100–108)
Creatinine: 1.19 MG/DL (ref 0.6–1.3)
GFR est AA: >60 mL/min/{1.73_m2} (ref >60)
GFR est non-AA: >60 mL/min/{1.73_m2} (ref >60)
Globulin: 3.8 g/dL (ref 2.0–4.0)
Glucose: 86 mg/dL (ref 74–99)
Potassium: 4.3 mmol/L (ref 3.5–5.5)
Protein, total: 6.9 g/dL (ref 6.4–8.2)
Sodium: 137 mmol/L (ref 136–145)

## 2017-01-23 LAB — PSA, DIAGNOSTIC (PROSTATE SPECIFIC AG): Prostate Specific Ag: 0.8 ng/mL (ref 0.0–4.0)

## 2017-01-23 MED ORDER — IBUPROFEN 800 MG TAB
800 mg | ORAL_TABLET | ORAL | 5 refills | Status: DC
Start: 2017-01-23 — End: 2017-07-18

## 2017-01-23 MED ORDER — CEPHALEXIN 500 MG CAP
500 mg | ORAL_CAPSULE | Freq: Three times a day (TID) | ORAL | 0 refills | Status: DC
Start: 2017-01-23 — End: 2017-03-22

## 2017-01-23 MED ORDER — TIZANIDINE 4 MG TAB
4 mg | ORAL_TABLET | Freq: Four times a day (QID) | ORAL | 5 refills | Status: DC | PRN
Start: 2017-01-23 — End: 2017-01-28

## 2017-01-23 NOTE — ACP (Advance Care Planning) (Signed)
Do you have an Advance Directive No  Do you want more information No

## 2017-01-23 NOTE — Progress Notes (Signed)
Patient is in the office today for 6 month follow up.    1. Have you been to the ER, urgent care clinic since your last visit?  Hospitalized since your last visit?No    2. Have you seen or consulted any other health care providers outside of the [facility] since your last visit?  Include any pap smears or colon screening. No       Verbal order read back per Dr. Madelyn Brunner.  Patient received influenza vaccine in right deltoid.  Patient tolerated well and left without complaints.  Patient received VIS.             This is the Subsequent Medicare Annual Wellness Exam, performed 12 months or more after the Initial AWV or the last Subsequent AWV    I have reviewed the patient's medical history in detail and updated the computerized patient record.     History     Past Medical History:   Diagnosis Date   ??? Hypercholesterolemia    ??? Migraine headache       No past surgical history on file.  Current Outpatient Medications   Medication Sig Dispense Refill   ??? tiZANidine (ZANAFLEX) 4 mg tablet TAKE 1 TABLET BY MOUTH EVERY 6 HOURS AS NEEDED 90 Tab 2   ??? omeprazole (PRILOSEC) 40 mg capsule TAKE 1 CAPSULE BY MOUTH DAILY 90 Cap 0   ??? ibuprofen (MOTRIN) 800 mg tablet TAKE 1 TABLET BY MOUTH EVERY 8 HOURS AS NEEDED FOR PAIN 90 Tab 2     Allergies   Allergen Reactions   ??? Gabapentin Other (comments)     Caused foot pain     Family History   Problem Relation Age of Onset   ??? Diabetes Mother    ??? Hypertension Father      Social History     Tobacco Use   ??? Smoking status: Current Every Day Smoker     Packs/day: 0.50     Years: 20.00     Pack years: 10.00   ??? Smokeless tobacco: Never Used   Substance Use Topics   ??? Alcohol use: Yes     Alcohol/week: 7.2 oz     Types: 12 Cans of beer per week     Comment: 12 pk a week     Patient Active Problem List   Diagnosis Code   ??? Migraine headache G43.909       Depression Risk Factor Screening:     PHQ over the last two weeks 01/23/2017    Little interest or pleasure in doing things Not at all   Feeling down, depressed, irritable, or hopeless Not at all   Total Score PHQ 2 0     Alcohol Risk Factor Screening:   You do not drink alcohol or very rarely.    Functional Ability and Level of Safety:   Hearing Loss  Hearing is good.    Activities of Daily Living  The home contains: no safety equipment.  Patient does total self care    Fall Risk  No flowsheet data found.    Abuse Screen  Patient is not abused    Cognitive Screening   Evaluation of Cognitive Function:  Has your family/caregiver stated any concerns about your memory: no  Normal    Patient Care Team   Patient Care Team:  Thana Farr, MD as PCP - General (Internal Medicine)      Glaucoma Screening-   Pneumonia Vaccine-  Due at age  13   Shingles Vaccine- Due at age 25   Tdap Vaccine- not UTD   Colonoscopy-  Due at age 70   PSA ordered today   Advance Directive-  Pt given information     Assessment/Plan   Education and counseling provided:  Are appropriate based on today's review and evaluation          Health Maintenance Due   Topic Date Due   ??? Pneumococcal 19-64 Medium Risk (1 of 1 - PPSV23) 02/28/1989   ??? DTaP/Tdap/Td series (1 - Tdap) 03/01/1991   ??? Influenza Age 47 to Adult  10/26/2016   ??? MEDICARE YEARLY EXAM  01/12/2017     A comprehensive 5 year plan for medical care and screening exams was reviewed with pt and they received a copy of it.

## 2017-01-23 NOTE — Patient Instructions (Addendum)
High Blood Pressure: Care Instructions  Your Care Instructions    If your blood pressure is usually above 130/80, you have high blood pressure, or hypertension. That means the top number is 130 or higher or the bottom number is 80 or higher, or both.  Despite what a lot of people think, high blood pressure usually doesn't cause headaches or make you feel dizzy or lightheaded. It usually has no symptoms. But it does increase your risk for heart attack, stroke, and kidney or eye damage. The higher your blood pressure, the more your risk increases.  Your doctor will give you a goal for your blood pressure. Your goal will be based on your health and your age.  Lifestyle changes, such as eating healthy and being active, are always important to help lower blood pressure. You might also take medicine to reach your blood pressure goal.  Follow-up care is a key part of your treatment and safety. Be sure to make and go to all appointments, and call your doctor if you are having problems. It's also a good idea to know your test results and keep a list of the medicines you take.  How can you care for yourself at home?  Medical treatment  ?? If you stop taking your medicine, your blood pressure will go back up. You may take one or more types of medicine to lower your blood pressure. Be safe with medicines. Take your medicine exactly as prescribed. Call your doctor if you think you are having a problem with your medicine.  ?? Talk to your doctor before you start taking aspirin every day. Aspirin can help certain people lower their risk of a heart attack or stroke. But taking aspirin isn't right for everyone, because it can cause serious bleeding.  ?? See your doctor regularly. You may need to see the doctor more often at first or until your blood pressure comes down.  ?? If you are taking blood pressure medicine, talk to your doctor before you take decongestants or anti-inflammatory medicine, such as ibuprofen.  Some of these medicines can raise blood pressure.  ?? Learn how to check your blood pressure at home.  Lifestyle changes  ?? Stay at a healthy weight. This is especially important if you put on weight around the waist. Losing even 10 pounds can help you lower your blood pressure.  ?? If your doctor recommends it, get more exercise. Walking is a good choice. Bit by bit, increase the amount you walk every day. Try for at least 30 minutes on most days of the week. You also may want to swim, bike, or do other activities.  ?? Avoid or limit alcohol. Talk to your doctor about whether you can drink any alcohol.  ?? Try to limit how much sodium you eat to less than 2,300 milligrams (mg) a day. Your doctor may ask you to try to eat less than 1,500 mg a day.  ?? Eat plenty of fruits (such as bananas and oranges), vegetables, legumes, whole grains, and low-fat dairy products.  ?? Lower the amount of saturated fat in your diet. Saturated fat is found in animal products such as milk, cheese, and meat. Limiting these foods may help you lose weight and also lower your risk for heart disease.  ?? Do not smoke. Smoking increases your risk for heart attack and stroke. If you need help quitting, talk to your doctor about stop-smoking programs and medicines. These can increase your chances of quitting for good.  When   should you call for help?  Call 911 anytime you think you may need emergency care. This may mean having symptoms that suggest that your blood pressure is causing a serious heart or blood vessel problem. Your blood pressure may be over 180/120.  ??For example, call 911 if:  ?? ?? You have symptoms of a heart attack. These may include:  ? Chest pain or pressure, or a strange feeling in the chest.  ? Sweating.  ? Shortness of breath.  ? Nausea or vomiting.  ? Pain, pressure, or a strange feeling in the back, neck, jaw, or upper belly or in one or both shoulders or arms.  ? Lightheadedness or sudden weakness.   ? A fast or irregular heartbeat.   ?? ?? You have symptoms of a stroke. These may include:  ? Sudden numbness, tingling, weakness, or loss of movement in your face, arm, or leg, especially on only one side of your body.  ? Sudden vision changes.  ? Sudden trouble speaking.  ? Sudden confusion or trouble understanding simple statements.  ? Sudden problems with walking or balance.  ? A sudden, severe headache that is different from past headaches.   ?? ?? You have severe back or belly pain.   ??Do not wait until your blood pressure comes down on its own. Get help right away.  ??Call your doctor now or seek immediate care if:  ?? ?? Your blood pressure is much higher than normal (such as 180/120 or higher), but you don't have symptoms.   ?? ?? You think high blood pressure is causing symptoms, such as:  ? Severe headache.  ? Blurry vision.   ??Watch closely for changes in your health, and be sure to contact your doctor if:  ?? ?? Your blood pressure measures higher than your doctor recommends at least 2 times. That means the top number is higher or the bottom number is higher, or both.   ?? ?? You think you may be having side effects from your blood pressure medicine.   Where can you learn more?  Go to [email or web address].  Enter [phone] in the search box to learn more about "High Blood Pressure: Care Instructions."  Current as of: March 02, 2016  Content Version: 11.8  ?? 2006-2018 [employer]. Care instructions adapted under license by [employer] (which disclaims liability or warranty for this information). If you have questions about a medical condition or this instruction, always ask your healthcare professional. [employer] any warranty or liability for your use of this information.      Medicare Part B Preventive Services Limitations Recommendation Scheduled   Bone Mass Measurement  (age 99 & older, biennial) Requires diagnosis related to osteoporosis or  estrogen deficiency. Biennial benefit unless patient has history of long-term glucocorticoid tx or baseline is needed because initial test was by other method     Cardiovascular Screening Blood Tests (every 5 years)  Total cholesterol, HDL, Triglycerides and ECG Order blood work  as a Quar[employer] if possible and  for adults with routine risk  an electrocardiogram (ECG) at intervals determined by the provider.   Lipid 10/17  cmp 10/17   Colorectal Cancer Screening  -Fecal occult blood test (annual)  -Flexible sigmoidoscopy (5y)  -Screening colonoscopy (10y)  -Barium Enema Colorectal cancer screening should be done for adults age 41-75 with no increased risk factors for colorectal cancer.  There are a number of acceptable methods of screening for this type of cancer. Each test  has its own benefits and drawbacks. Discuss with your provider what is most appropriate for you during your annual wellness visit. The different tests include: colonoscopy (considered the best screening method), a fecal occult blood test, a fecal DNA test, and sigmoidoscopy     Counseling to Prevent Tobacco Use (up to 8 sessions per year)  - Counseling greater than 3 and up to 10 minutes  - Counseling greater than 10 minutes Patients must be asymptomatic of tobacco-related conditions to receive as preventive service     Diabetes Screening Tests (at least every 3 years, Medicare covers annually or at 26-month intervals for prediabetic patients)    Fasting blood sugar (FBS) or glucose tolerance test (GTT) All adults age 49-70 who are overweight should have a diabetes screening test once every three years.   -Other screening tests & preventive services for persons with diabetes include: an eye exam to screen for diabetic retinopathy, a kidney function test, a foot exam, and stricter control over your cholesterol.      Diabetes Self-Management Training (DSMT) (no USPSTF recommendation)  Requires referral by treating physician for patient with diabetes or renal disease. 10 hours of initial DSMT session of no less than 30 minutes each in a continuous 18-month period.  2 hours of follow-up DSMT in subsequent years.     Glaucoma Screening (no USPSTF recommendation) Diabetes mellitus, family history, African American, age 70 or over, Hispanic American, age 86 or over  order   Human Immunodeficiency Virus (HIV) Screening (annually for increased risk patients)  HIV-1 and HIV-2 by EIA, ELISA, rapid antibody test, or oral mucosa transudate Patient must be at increased risk for HIV infection per USPSTF guidelines or pregnant.  Tests covered annually for patients at increased risk.  Pregnant patients may receive up to 3 test during pregnancy.     Medical Nutrition Therapy (MNT) (for diabetes or renal disease not recommended schedule) Requires referral by treating physician for patient with diabetes or renal disease.  Can be provided in same year as diabetes self-management training (DSMT), and CMS recommends medical nutrition therapy take place after DSMT.  Up to 3 hours for initial year and 2 hours in subsequent years.     Prostate Cancer Screening (annually up to age 51)  - Digital rectal exam (DRE)  - Prostate specific antigen (PSA) Annually (age 83 or over), DRE not paid separately when covered E/M service is provided on same date  Men up to age 75 may need a screening blood test for prostate cancer at certain intervals, depending on their personal and family history. This decision is between the patient and his provider.  10/17   Seasonal Influenza Vaccination (annually) All adults should have a flu vaccine yearly        Pneumococcal Vaccination (once after 44) All adults  over age 82 should receive the recommended pneumonia vaccines. Current USPSTF guidelines recommend a series of two vaccines for the best pneumonia protection.       Hepatitis B Vaccinations (if medium/high risk) Medium/high risk factors:  End-stage renal disease,  Hemophiliacs who received Factor VIII or IX concentrates, Clients of institutions for the mentally retarded, Persons who live in the same house as a HepB virus carrier, Homosexual men, Illicit injectable drug abusers.     Shingles Vaccination A shingles vaccine is also recommended once in a lifetime after age 75     Ultrasound Screening for Abdominal Aortic Aneurysm (AAA) (once) An Abdominal Aortic Aneurysm (AAA) Screening is recommended for men age  65-75 who has ever smoked in their lifetime.  of the following criteria:  - Men who are 42-29 years old and have smoked more than 100 cigarettes in their lifetime.  -Anyone with a FH of AAA  -Anyone recommended for screening by USPSTF       Hep C All adults born between [place] should be screened once for Hepatitis C     Tetanus  All adults should have a tetanus vaccine every 10 years

## 2017-01-23 NOTE — Progress Notes (Signed)
Dean Walker is a 46 y.o.  male and presents with Annual Wellness Visit; GERD; and Cholesterol Problem      SUBJECTIVE:  Osteoarthritis and Chronic Pain:  Patient has osteoarthritis, primarily affecting the back.   Symptoms onset: problem is longstanding.  Rheumatological ROS: using narcotic analgesics regularly daily in stable pattern with good pain control and good quality of life.   Response to treatment plan: pt is using ETOH and marijuana so will stop Norco at this time and use NSAIDs. Pt is using Motrin 800 mg TID and Zanaflex 4 mg up to 4 tabs/day   Pt's GERD is well controlled Prilosec     Pt continues to smoke tobacco. He has been counseled on the need to stop and options available.     Pt has h/o recurrent boils in groin area and wanted antibiotics to use prn.     Pt's BP is elevated today which could be due to NSAID use as well as ETOH use and tobacco and marijuana use. Will continue to monitor for now.     Respiratory ROS: negative for - shortness of breath  Cardiovascular ROS: negative for - chest pain    Current Outpatient Medications   Medication Sig   ??? tiZANidine (ZANAFLEX) 4 mg tablet Take 1 Tab by mouth every six (6) hours as needed.   ??? ibuprofen (MOTRIN) 800 mg tablet TAKE 1 TABLET BY MOUTH EVERY 8 HOURS AS NEEDED FOR PAIN   ??? cephALEXin (KEFLEX) 500 mg capsule Take 1 Cap by mouth three (3) times daily.   ??? omeprazole (PRILOSEC) 40 mg capsule TAKE 1 CAPSULE BY MOUTH DAILY     No current facility-administered medications for this visit.          OBJECTIVE:  alert, well appearing, and in no distress  Visit Vitals  BP (!) 141/96 (BP 1 Location: Left arm, BP Patient Position: Sitting)   Pulse 87   Temp 98.8 ??F (37.1 ??C) (Oral)   Resp 20   Ht 5\' 6"  (1.676 m)   Wt 157 lb (71.2 kg)   SpO2 97%   BMI 25.34 kg/m??      well developed and well nourished  Chest - clear to auscultation, no wheezes, rales or rhonchi, symmetric air entry  Heart - normal rate, regular rhythm, normal S1, S2, no murmurs, rubs,  clicks or gallops  Extremities - peripheral pulses normal, no pedal edema, no clubbing or cyanosis        Assessment/Plan      ICD-10-CM ICD-9-CM    1. Medicare annual wellness visit, subsequent Z00.00 V70.0    2. Encounter for immunization Z23 V03.89 INFLUENZA VIRUS VAC QUAD,SPLIT,PRESV FREE SYRINGE IM   3. Recurrent boils L02.93 680.9 Will treat with Keflex 500 mg TID. If not improving pt may need to be checked for MRSA METABOLIC PANEL, COMPREHENSIVE      CBC WITH AUTOMATED DIFF   4. 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 Screening PSA (prostate specific antigen) Z12.5 V76.44 PSA, DIAGNOSTIC (PROSTATE SPECIFIC AG)   6. Chronic midline low back pain without sciatica M54.5 724.2 Fairly well controlled on Morin 800 mg TID and Zanaflex 4 mg Q6 prn     G89.29 338.29        Follow-up Disposition:  Return in about 6 months (around 07/24/2017) for BP check.    Reviewed plan of care. Patient has provided input and agrees with goals.

## 2017-01-24 NOTE — Telephone Encounter (Signed)
-----   Message from Thana Farr, MD sent at 01/24/2017  6:39 AM EDT -----  Tell patient his/her blood work is good

## 2017-01-24 NOTE — Progress Notes (Signed)
Patient is aware blood work is good. Patient verbalizes understanding.

## 2017-01-24 NOTE — Progress Notes (Signed)
Tell patient his/her blood work is good

## 2017-01-27 ENCOUNTER — Encounter

## 2017-01-27 MED ORDER — OMEPRAZOLE 40 MG CAP, DELAYED RELEASE
40 mg | ORAL_CAPSULE | ORAL | 2 refills | Status: DC
Start: 2017-01-27 — End: 2017-10-25

## 2017-01-27 NOTE — Telephone Encounter (Signed)
Last OV 01/23/2017  Next OV 07/10/2017  Last refill   10/24/2016

## 2017-01-28 MED ORDER — TIZANIDINE 4 MG TAB
4 mg | ORAL_TABLET | Freq: Four times a day (QID) | ORAL | 0 refills | Status: DC | PRN
Start: 2017-01-28 — End: 2017-03-22

## 2017-01-28 NOTE — Telephone Encounter (Signed)
I received a call from the messaging center and was connected to the patient.  He wanted a refill of his muscle relaxant.  As I was trying to get patient's details so as to send in the medication he became rude and abrasive and started asking whether I am a real physician.  I calmly got his details and will send in a refill of his muscle relaxant to the pharmacy.  Dean Pisarski [place], MD

## 2017-01-28 NOTE — Telephone Encounter (Signed)
I had already talked to pharmacy and the issue was that he could not have the Zanaflex refilled until Monday due to insurance. The pharmacist had communicated this to the patient already today.

## 2017-03-22 MED ORDER — CEPHALEXIN 500 MG CAP
500 mg | ORAL_CAPSULE | ORAL | 0 refills | Status: DC
Start: 2017-03-22 — End: 2017-07-18

## 2017-03-22 MED ORDER — TIZANIDINE 4 MG TAB
4 mg | ORAL_TABLET | ORAL | 0 refills | Status: DC
Start: 2017-03-22 — End: 2017-07-18

## 2017-03-22 NOTE — Telephone Encounter (Signed)
Confirm which pharmacy pt wants theses meds sent to

## 2017-03-22 NOTE — Telephone Encounter (Signed)
Pt wants refill to go to [facility].

## 2017-07-10 ENCOUNTER — Encounter: Attending: Internal Medicine | Primary: Internal Medicine

## 2017-07-18 ENCOUNTER — Encounter: Attending: Internal Medicine | Primary: Internal Medicine

## 2017-07-18 MED ORDER — TIZANIDINE 4 MG TAB
4 mg | ORAL_TABLET | ORAL | 5 refills | Status: DC
Start: 2017-07-18 — End: 2018-02-21

## 2017-07-18 MED ORDER — IBUPROFEN 800 MG TAB
800 mg | ORAL_TABLET | ORAL | 5 refills | Status: DC
Start: 2017-07-18 — End: 2018-03-27

## 2017-07-18 MED ORDER — CEPHALEXIN 500 MG CAP
500 mg | ORAL_CAPSULE | ORAL | 0 refills | Status: DC
Start: 2017-07-18 — End: 2017-08-29

## 2017-07-18 NOTE — Telephone Encounter (Signed)
Requested Prescriptions     Pending Prescriptions Disp Refills   ??? ibuprofen (MOTRIN) 800 mg tablet 90 Tab 5     Sig: TAKE 1 TABLET BY MOUTH EVERY 8 HOURS AS NEEDED FOR PAIN   ??? tiZANidine (ZANAFLEX) 4 mg tablet 360 Tab 0   ??? cephALEXin (KEFLEX) 500 mg capsule 30 Cap 0

## 2017-08-09 ENCOUNTER — Emergency Department (HOSPITAL_COMMUNITY): Payer: Medicare Other

## 2017-08-09 ENCOUNTER — Encounter (HOSPITAL_COMMUNITY): Payer: Self-pay

## 2017-08-09 ENCOUNTER — Other Ambulatory Visit: Payer: Self-pay

## 2017-08-09 ENCOUNTER — Inpatient Hospital Stay (HOSPITAL_COMMUNITY)
Admission: EM | Admit: 2017-08-09 | Discharge: 2017-08-19 | DRG: 853 | Disposition: A | Discharge status: Home / Self Care | Payer: Medicare Other | Attending: Family Medicine | Admitting: Family Medicine

## 2017-08-09 DIAGNOSIS — Z23 Encounter for immunization: Secondary | ICD-10-CM | POA: Diagnosis present

## 2017-08-09 DIAGNOSIS — R918 Other nonspecific abnormal finding of lung field: Secondary | ICD-10-CM

## 2017-08-09 DIAGNOSIS — Z9049 Acquired absence of other specified parts of digestive tract: Secondary | ICD-10-CM | POA: Diagnosis not present

## 2017-08-09 DIAGNOSIS — Z789 Other specified health status: Secondary | ICD-10-CM | POA: Diagnosis not present

## 2017-08-09 DIAGNOSIS — E876 Hypokalemia: Secondary | ICD-10-CM

## 2017-08-09 DIAGNOSIS — H5461 Unqualified visual loss, right eye, normal vision left eye: Secondary | ICD-10-CM | POA: Diagnosis present

## 2017-08-09 DIAGNOSIS — R05 Cough: Secondary | ICD-10-CM | POA: Diagnosis not present

## 2017-08-09 DIAGNOSIS — R079 Chest pain, unspecified: Secondary | ICD-10-CM | POA: Diagnosis present

## 2017-08-09 DIAGNOSIS — F1210 Cannabis abuse, uncomplicated: Secondary | ICD-10-CM | POA: Diagnosis present

## 2017-08-09 DIAGNOSIS — K219 Gastro-esophageal reflux disease without esophagitis: Secondary | ICD-10-CM | POA: Diagnosis present

## 2017-08-09 DIAGNOSIS — R071 Chest pain on breathing: Secondary | ICD-10-CM | POA: Diagnosis not present

## 2017-08-09 DIAGNOSIS — M545 Low back pain, unspecified: Secondary | ICD-10-CM

## 2017-08-09 DIAGNOSIS — F17210 Nicotine dependence, cigarettes, uncomplicated: Secondary | ICD-10-CM | POA: Diagnosis present

## 2017-08-09 DIAGNOSIS — L02214 Cutaneous abscess of groin: Secondary | ICD-10-CM | POA: Diagnosis present

## 2017-08-09 DIAGNOSIS — A419 Sepsis, unspecified organism: Secondary | ICD-10-CM | POA: Diagnosis present

## 2017-08-09 DIAGNOSIS — R059 Cough, unspecified: Secondary | ICD-10-CM

## 2017-08-09 DIAGNOSIS — J189 Pneumonia, unspecified organism: Secondary | ICD-10-CM | POA: Diagnosis present

## 2017-08-09 DIAGNOSIS — D72829 Elevated white blood cell count, unspecified: Secondary | ICD-10-CM | POA: Diagnosis not present

## 2017-08-09 DIAGNOSIS — I10 Essential (primary) hypertension: Secondary | ICD-10-CM | POA: Diagnosis present

## 2017-08-09 DIAGNOSIS — F1010 Alcohol abuse, uncomplicated: Secondary | ICD-10-CM | POA: Diagnosis present

## 2017-08-09 DIAGNOSIS — Z9889 Other specified postprocedural states: Secondary | ICD-10-CM

## 2017-08-09 DIAGNOSIS — R911 Solitary pulmonary nodule: Secondary | ICD-10-CM | POA: Diagnosis not present

## 2017-08-09 DIAGNOSIS — A150 Tuberculosis of lung: Secondary | ICD-10-CM | POA: Diagnosis not present

## 2017-08-09 DIAGNOSIS — R7989 Other specified abnormal findings of blood chemistry: Secondary | ICD-10-CM

## 2017-08-09 DIAGNOSIS — R945 Abnormal results of liver function studies: Secondary | ICD-10-CM | POA: Diagnosis not present

## 2017-08-09 DIAGNOSIS — A159 Respiratory tuberculosis unspecified: Secondary | ICD-10-CM | POA: Diagnosis not present

## 2017-08-09 HISTORY — DX: Accidental discharge from unspecified firearms or gun, initial encounter: W34.00XA

## 2017-08-09 HISTORY — DX: Essential (primary) hypertension: I10

## 2017-08-09 LAB — COMPREHENSIVE METABOLIC PANEL
ALK PHOS: 153 U/L — AB (ref 38–126)
ALT: 98 U/L — ABNORMAL HIGH (ref 17–63)
ANION GAP: 14 (ref 5–15)
AST: 44 U/L — ABNORMAL HIGH (ref 15–41)
Albumin: 2.4 g/dL — ABNORMAL LOW (ref 3.5–5.0)
BUN: 10 mg/dL (ref 6–20)
CALCIUM: 8.9 mg/dL (ref 8.9–10.3)
CO2: 20 mmol/L — ABNORMAL LOW (ref 22–32)
Chloride: 101 mmol/L (ref 101–111)
Creatinine, Ser: 1.17 mg/dL (ref 0.61–1.24)
GFR calc non Af Amer: >60 mL/min (ref >60)
Glucose, Bld: 152 mg/dL — ABNORMAL HIGH (ref 65–99)
Potassium: 3 mmol/L — ABNORMAL LOW (ref 3.5–5.1)
Sodium: 135 mmol/L (ref 135–145)
TOTAL PROTEIN: 7.4 g/dL (ref 6.5–8.1)
Total Bilirubin: 0.5 mg/dL (ref 0.3–1.2)

## 2017-08-09 LAB — URINALYSIS, ROUTINE W REFLEX MICROSCOPIC
Bilirubin Urine: NEGATIVE
GLUCOSE, UA: NEGATIVE mg/dL
HGB URINE DIPSTICK: NEGATIVE
KETONES UR: NEGATIVE mg/dL
Leukocytes, UA: NEGATIVE
Nitrite: NEGATIVE
PROTEIN: NEGATIVE mg/dL
Specific Gravity, Urine: >1.046 — ABNORMAL HIGH (ref 1.005–1.030)
pH: 6 (ref 5.0–8.0)

## 2017-08-09 LAB — CBC WITH DIFFERENTIAL/PLATELET
ABS IMMATURE GRANULOCYTES: 0.2 10*3/uL — AB (ref 0.0–0.1)
BASOS PCT: 0 %
Basophils Absolute: 0 10*3/uL (ref 0.0–0.1)
EOS ABS: 0.1 10*3/uL (ref 0.0–0.7)
Eosinophils Relative: 1 %
HCT: 36.4 % — ABNORMAL LOW (ref 39.0–52.0)
Hemoglobin: 12.2 g/dL — ABNORMAL LOW (ref 13.0–17.0)
IMMATURE GRANULOCYTES: 1 %
Lymphocytes Relative: 12 %
Lymphs Abs: 2.1 10*3/uL (ref 0.7–4.0)
MCH: 31.4 pg (ref 26.0–34.0)
MCHC: 33.5 g/dL (ref 30.0–36.0)
MCV: 93.8 fL (ref 78.0–100.0)
MONO ABS: 1.6 10*3/uL — AB (ref 0.1–1.0)
Monocytes Relative: 9 %
NEUTROS ABS: 13.9 10*3/uL — AB (ref 1.7–7.7)
Neutrophils Relative %: 77 %
PLATELETS: 654 10*3/uL — AB (ref 150–400)
RBC: 3.88 MIL/uL — ABNORMAL LOW (ref 4.22–5.81)
RDW: 13.2 % (ref 11.5–15.5)
WBC: 17.9 10*3/uL — ABNORMAL HIGH (ref 4.0–10.5)

## 2017-08-09 LAB — HEMOGLOBIN A1C
Hgb A1c MFr Bld: 5 % (ref 4.8–5.6)
Mean Plasma Glucose: 96.8 mg/dL

## 2017-08-09 LAB — PROCALCITONIN: PROCALCITONIN: 0.21 ng/mL

## 2017-08-09 LAB — TROPONIN I: Troponin I: <0.03 ng/mL (ref <0.03)

## 2017-08-09 MED ORDER — LORAZEPAM 2 MG/ML IJ SOLN: 1.0000 mg | Freq: Four times a day (QID) | INTRAMUSCULAR | Status: AC | PRN

## 2017-08-09 MED ORDER — IOPAMIDOL (ISOVUE-370) INJECTION 76%
INTRAVENOUS | Status: AC
  Administered 2017-08-09: 100 mL via INTRAVENOUS
  Filled 2017-08-09: qty 100

## 2017-08-09 MED ORDER — FOLIC ACID 1 MG PO TABS
1.0000 mg | ORAL_TABLET | Freq: Every day | ORAL | Status: DC
  Administered 2017-08-09 – 2017-08-19 (×11): 1 mg via ORAL
  Filled 2017-08-09 (×11): qty 1

## 2017-08-09 MED ORDER — ENOXAPARIN SODIUM 40 MG/0.4ML ~~LOC~~ SOLN
40.0000 mg | SUBCUTANEOUS | Status: DC
  Administered 2017-08-10 – 2017-08-15 (×7): 40 mg via SUBCUTANEOUS
  Filled 2017-08-09 (×8): qty 0.4

## 2017-08-09 MED ORDER — IBUPROFEN 400 MG PO TABS
400.0000 mg | ORAL_TABLET | Freq: Four times a day (QID) | ORAL | Status: DC | PRN
  Administered 2017-08-10 – 2017-08-18 (×14): 400 mg via ORAL
  Filled 2017-08-09 (×15): qty 1

## 2017-08-09 MED ORDER — VITAMIN B-1 100 MG PO TABS
100.0000 mg | ORAL_TABLET | Freq: Every day | ORAL | Status: DC
  Administered 2017-08-09 – 2017-08-19 (×11): 100 mg via ORAL
  Filled 2017-08-09 (×11): qty 1

## 2017-08-09 MED ORDER — POTASSIUM CHLORIDE CRYS ER 20 MEQ PO TBCR
40.0000 meq | EXTENDED_RELEASE_TABLET | Freq: Once | ORAL | Status: AC
  Administered 2017-08-09: 40 meq via ORAL
  Filled 2017-08-09: qty 2

## 2017-08-09 MED ORDER — ADULT MULTIVITAMIN W/MINERALS CH
1.0000 | ORAL_TABLET | Freq: Every day | ORAL | Status: DC
  Administered 2017-08-09 – 2017-08-19 (×11): 1 via ORAL
  Filled 2017-08-09 (×11): qty 1

## 2017-08-09 MED ORDER — LORAZEPAM 1 MG PO TABS: 1.0000 mg | ORAL_TABLET | Freq: Four times a day (QID) | ORAL | Status: AC | PRN

## 2017-08-09 MED ORDER — AZITHROMYCIN 250 MG PO TABS
500.0000 mg | ORAL_TABLET | Freq: Once | ORAL | Status: AC
Start: 2017-08-09 — End: 2017-08-09
  Administered 2017-08-09: 500 mg via ORAL
  Filled 2017-08-09: qty 2

## 2017-08-09 MED ORDER — THIAMINE HCL 100 MG/ML IJ SOLN: 100.0000 mg | Freq: Every day | INTRAMUSCULAR | Status: DC

## 2017-08-09 MED ORDER — SODIUM CHLORIDE 0.9 % IV SOLN
1.0000 g | Freq: Once | INTRAVENOUS | Status: AC
  Administered 2017-08-09: 1 g via INTRAVENOUS
  Filled 2017-08-09: qty 10

## 2017-08-09 MED ORDER — SODIUM CHLORIDE 0.9 % IV SOLN
INTRAVENOUS | Status: DC
  Administered 2017-08-09 – 2017-08-13 (×8): via INTRAVENOUS

## 2017-08-09 NOTE — ED Provider Notes (Signed)
Pt seen by Dr Alvino Chapel.  Please see his note.  Plan on CTA, dispo accordingly  CT scan shows numerous pulm nodules.  Malignancy vs infectious.  Will start pt on abx.  Consult with medical service for admission and further workup.  HIV ordered   Dorie Rank, MD 08/09/17 1659

## 2017-08-09 NOTE — ED Triage Notes (Signed)
Pt states he has been seeing his MD at the [facility] and says he is okay, pt states he has had fatigue, high blood pressure, cough. Pt alert and oriented in triage, no distress noted. Pt states he wants all test run and a second opinion. Pt somewhat anxious in triage.
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
## 2017-08-09 NOTE — ED Notes (Signed)
The pt alert he reports that he he has rt sided chest pain whenever he coughs otherwise no pain there.  He also has some back pain

## 2017-08-09 NOTE — Care Management Note (Signed)
Case Management Note  Patient Details  Name: Juan Mcintosh MRN: [id number] Date of Birth: 01-22-1971  Subjective/Objective:                  Hypertension  Action/Plan: [facility] spoke with the patient at the bedside. Provided the patient with the contact information for [facility] and encouraged the patient to call the [facility] telephone number listed on his card for a list of in-network providers. Patient verbalizes understanding. He reports no other CM needs at this time. Patient has transportation to appointments once scheduled.   Expected Discharge Date:   unknown               Expected Discharge Plan:  Home/Self Care  In-House Referral:     Discharge planning Services  CM Consult  Post Acute Care Choice:    Choice offered to:     DME Arranged:    DME Agency:     HH Arranged:    HH Agency:     Status of Service:  Completed, signed off  If discussed at [employer] of Stay Meetings, dates discussed:    Additional Comments:  Apolonio Schneiders, RN 08/09/2017, 7:25 PM
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
## 2017-08-09 NOTE — ED Notes (Signed)
Admitting physicians at the bedside

## 2017-08-09 NOTE — ED Notes (Signed)
Pt was unable to provide urine sample. Urinal left at bedside.

## 2017-08-10 ENCOUNTER — Inpatient Hospital Stay (HOSPITAL_COMMUNITY): Payer: Medicare Other

## 2017-08-10 DIAGNOSIS — J189 Pneumonia, unspecified organism: Secondary | ICD-10-CM

## 2017-08-10 DIAGNOSIS — R918 Other nonspecific abnormal finding of lung field: Secondary | ICD-10-CM | POA: Insufficient documentation

## 2017-08-10 DIAGNOSIS — R059 Cough, unspecified: Secondary | ICD-10-CM | POA: Insufficient documentation

## 2017-08-10 DIAGNOSIS — R05 Cough: Secondary | ICD-10-CM

## 2017-08-10 DIAGNOSIS — E876 Hypokalemia: Secondary | ICD-10-CM | POA: Insufficient documentation

## 2017-08-10 DIAGNOSIS — A159 Respiratory tuberculosis unspecified: Secondary | ICD-10-CM

## 2017-08-10 LAB — COMPREHENSIVE METABOLIC PANEL
ALT: 89 U/L — AB (ref 17–63)
AST: 43 U/L — ABNORMAL HIGH (ref 15–41)
Albumin: 2.1 g/dL — ABNORMAL LOW (ref 3.5–5.0)
Alkaline Phosphatase: 142 U/L — ABNORMAL HIGH (ref 38–126)
Anion gap: 11 (ref 5–15)
BUN: 7 mg/dL (ref 6–20)
CHLORIDE: 105 mmol/L (ref 101–111)
CO2: 19 mmol/L — ABNORMAL LOW (ref 22–32)
CREATININE: 1.03 mg/dL (ref 0.61–1.24)
Calcium: 8.5 mg/dL — ABNORMAL LOW (ref 8.9–10.3)
Glucose, Bld: 133 mg/dL — ABNORMAL HIGH (ref 65–99)
POTASSIUM: 3.1 mmol/L — AB (ref 3.5–5.1)
Sodium: 135 mmol/L (ref 135–145)
Total Bilirubin: 0.4 mg/dL (ref 0.3–1.2)
Total Protein: 6.3 g/dL — ABNORMAL LOW (ref 6.5–8.1)

## 2017-08-10 LAB — PSA: PROSTATIC SPECIFIC ANTIGEN: 0.38 ng/mL (ref 0.00–4.00)

## 2017-08-10 LAB — HIV ANTIBODY (ROUTINE TESTING W REFLEX): HIV Screen 4th Generation wRfx: NONREACTIVE

## 2017-08-10 LAB — PROCALCITONIN: Procalcitonin: 0.19 ng/mL

## 2017-08-10 LAB — GAMMA GT: GGT: 93 U/L — ABNORMAL HIGH (ref 7–50)

## 2017-08-10 MED ORDER — SODIUM CHLORIDE 3 % IN NEBU
4.0000 mL | INHALATION_SOLUTION | Freq: Every day | RESPIRATORY_TRACT | Status: AC
  Administered 2017-08-10 – 2017-08-12 (×3): 4 mL via RESPIRATORY_TRACT
  Filled 2017-08-10 (×3): qty 4

## 2017-08-10 MED ORDER — ALBUTEROL SULFATE (2.5 MG/3ML) 0.083% IN NEBU: 2.5000 mg | INHALATION_SOLUTION | Freq: Four times a day (QID) | RESPIRATORY_TRACT | Status: DC | PRN

## 2017-08-10 MED ORDER — ENSURE ENLIVE PO LIQD
237.0000 mL | Freq: Two times a day (BID) | ORAL | Status: DC
  Administered 2017-08-10 – 2017-08-19 (×15): 237 mL via ORAL
  Filled 2017-08-10: qty 237

## 2017-08-10 MED ORDER — AZITHROMYCIN 250 MG PO TABS
500.0000 mg | ORAL_TABLET | ORAL | Status: DC
  Administered 2017-08-10 – 2017-08-13 (×4): 500 mg via ORAL
  Filled 2017-08-10 (×6): qty 2

## 2017-08-10 MED ORDER — TUBERCULIN PPD 5 UNIT/0.1ML ID SOLN
5.0000 [IU] | Freq: Once | INTRADERMAL | Status: AC
  Administered 2017-08-10: 5 [IU] via INTRADERMAL
  Filled 2017-08-10: qty 0.1

## 2017-08-10 MED ORDER — POTASSIUM CHLORIDE CRYS ER 20 MEQ PO TBCR
40.0000 meq | EXTENDED_RELEASE_TABLET | Freq: Once | ORAL | Status: AC
  Administered 2017-08-10: 40 meq via ORAL
  Filled 2017-08-10: qty 2

## 2017-08-10 MED ORDER — SODIUM CHLORIDE 0.9 % IV SOLN
1.0000 g | INTRAVENOUS | Status: DC
  Administered 2017-08-10 – 2017-08-18 (×9): 1 g via INTRAVENOUS
  Filled 2017-08-10 (×10): qty 10
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
## 2017-08-10 NOTE — Progress Notes (Signed)
1820 Received pt from ED, A&O x4, ambulating to the bathroom with steady gait. Denies chest pain, no shortness of breath.
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
## 2017-08-10 NOTE — ED Notes (Signed)
Admitting Physician at bedside.

## 2017-08-10 NOTE — ED Notes (Signed)
Pt ambulated to restroom with steady gait. Breakfast tray also given to patient.

## 2017-08-10 NOTE — ED Notes (Signed)
Consulting Pulmonologist at bedside for evaluation

## 2017-08-10 NOTE — ED Notes (Signed)
Regular diet tray ordered

## 2017-08-11 DIAGNOSIS — R911 Solitary pulmonary nodule: Secondary | ICD-10-CM

## 2017-08-11 DIAGNOSIS — A150 Tuberculosis of lung: Secondary | ICD-10-CM

## 2017-08-11 LAB — COMPREHENSIVE METABOLIC PANEL
ALK PHOS: 134 U/L — AB (ref 38–126)
ALT: 75 U/L — AB (ref 17–63)
ANION GAP: 10 (ref 5–15)
AST: 37 U/L (ref 15–41)
Albumin: 1.9 g/dL — ABNORMAL LOW (ref 3.5–5.0)
BUN: 8 mg/dL (ref 6–20)
CALCIUM: 8.4 mg/dL — AB (ref 8.9–10.3)
CO2: 21 mmol/L — ABNORMAL LOW (ref 22–32)
CREATININE: 1.03 mg/dL (ref 0.61–1.24)
Chloride: 107 mmol/L (ref 101–111)
GFR calc non Af Amer: >60 mL/min (ref >60)
Glucose, Bld: 97 mg/dL (ref 65–99)
Potassium: 3.6 mmol/L (ref 3.5–5.1)
Sodium: 138 mmol/L (ref 135–145)
TOTAL PROTEIN: 6 g/dL — AB (ref 6.5–8.1)
Total Bilirubin: 0.4 mg/dL (ref 0.3–1.2)

## 2017-08-11 LAB — CBC WITH DIFFERENTIAL/PLATELET
ABS IMMATURE GRANULOCYTES: 0.2 10*3/uL — AB (ref 0.0–0.1)
BASOS ABS: 0 10*3/uL (ref 0.0–0.1)
Basophils Relative: 0 %
EOS PCT: 1 %
Eosinophils Absolute: 0.2 10*3/uL (ref 0.0–0.7)
HCT: 31.2 % — ABNORMAL LOW (ref 39.0–52.0)
HEMOGLOBIN: 10.5 g/dL — AB (ref 13.0–17.0)
Immature Granulocytes: 1 %
LYMPHS PCT: 12 %
Lymphs Abs: 2.2 10*3/uL (ref 0.7–4.0)
MCH: 31.7 pg (ref 26.0–34.0)
MCHC: 33.7 g/dL (ref 30.0–36.0)
MCV: 94.3 fL (ref 78.0–100.0)
Monocytes Absolute: 2.1 10*3/uL — ABNORMAL HIGH (ref 0.1–1.0)
Monocytes Relative: 12 %
NEUTROS ABS: 13.3 10*3/uL — AB (ref 1.7–7.7)
Neutrophils Relative %: 74 %
Platelets: 598 10*3/uL — ABNORMAL HIGH (ref 150–400)
RBC: 3.31 MIL/uL — ABNORMAL LOW (ref 4.22–5.81)
RDW: 13.1 % (ref 11.5–15.5)
WBC: 18 10*3/uL — ABNORMAL HIGH (ref 4.0–10.5)

## 2017-08-11 MED ORDER — PNEUMOCOCCAL VAC POLYVALENT 25 MCG/0.5ML IJ INJ
0.5000 mL | INJECTION | INTRAMUSCULAR | Status: AC
  Administered 2017-08-15: 0.5 mL via INTRAMUSCULAR
  Filled 2017-08-11: qty 0.5

## 2017-08-11 NOTE — Plan of Care (Signed)
  Problem: Activity: Goal: Risk for activity intolerance will decrease Outcome: Progressing   Problem: Nutrition: Goal: Adequate nutrition will be maintained Outcome: Progressing   Problem: Pain Managment: Goal: General experience of comfort will improve Outcome: Progressing   
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
## 2017-08-12 LAB — CBC WITH DIFFERENTIAL/PLATELET
Abs Immature Granulocytes: 0.2 10*3/uL — ABNORMAL HIGH (ref 0.0–0.1)
BASOS ABS: 0.1 10*3/uL (ref 0.0–0.1)
Basophils Relative: 0 %
EOS ABS: 0.2 10*3/uL (ref 0.0–0.7)
EOS PCT: 1 %
HEMATOCRIT: 33.1 % — AB (ref 39.0–52.0)
HEMOGLOBIN: 11 g/dL — AB (ref 13.0–17.0)
Immature Granulocytes: 1 %
LYMPHS ABS: 1.8 10*3/uL (ref 0.7–4.0)
LYMPHS PCT: 9 %
MCH: 30.9 pg (ref 26.0–34.0)
MCHC: 33.2 g/dL (ref 30.0–36.0)
MCV: 93 fL (ref 78.0–100.0)
Monocytes Absolute: 1.9 10*3/uL — ABNORMAL HIGH (ref 0.1–1.0)
Monocytes Relative: 9 %
Neutro Abs: 16.3 10*3/uL — ABNORMAL HIGH (ref 1.7–7.7)
Neutrophils Relative %: 80 %
Platelets: 613 10*3/uL — ABNORMAL HIGH (ref 150–400)
RBC: 3.56 MIL/uL — AB (ref 4.22–5.81)
RDW: 13.1 % (ref 11.5–15.5)
WBC: 20.5 10*3/uL — AB (ref 4.0–10.5)

## 2017-08-12 LAB — POTASSIUM: POTASSIUM: 4 mmol/L (ref 3.5–5.1)

## 2017-08-12 LAB — ACID FAST SMEAR (AFB): ACID FAST SMEAR - AFSCU2: NEGATIVE

## 2017-08-12 LAB — ACID FAST SMEAR (AFB, MYCOBACTERIA)

## 2017-08-12 NOTE — Progress Notes (Signed)
Pt showed me a "boil " in his R groin that is draining small amt of purulent drainage. Area cleaned. Warm compress applied. Resident on call notified.

## 2017-08-12 NOTE — Progress Notes (Signed)
Family Practice Teaching Service Attending Brief Progress Note  S: breathing well today. Still occasional cough. Complains of boil in R groin, had been treated prior to admission, spontaneously draining.  O: Temp:  [99.5 F (37.5 C)-100.2 F (37.9 C)] 99.5 F (37.5 C) (05/18 1609) Pulse Rate:  [103-112] 112 (05/18 1609) Resp:  [18] 18 (05/18 1609) BP: (137-138)/(86-91) 137/86 (05/18 1609) SpO2:  [97 %-98 %] 98 % (05/18 1609)  Gen: NAD, pleasant, well appearing HEENT: normocephalic, atraumatic, moist mucous membranes Resp: normal work of breathing Abdomen: soft, nontender to palpation Skin: 1cm palpable abscess in R inguinal area which is spontaneously draining purulent fluid. Area cleansed with betadine and squeezed/drained of all available purulent fluid.   A/P:  1. Pulm nodules - concerning for malignancy. Undergoing workup for TB, may get bronch by pulmonology if TB workup neg. Await sputum results, quant gold, and PPD. Stable from a respiratory standpoint at this time. Continue CTX/azithro.  2. R groin abscess - spontaneously draining, aided in drainage today with expression of purulent fluid at bedside today. Monitor, add doxycycline if worsening to cover for MRSA.  3. ETOH abuse - CIWA scores remain 0. Continue monitoring.  Will sign resident progress note when it is available.  Chrisandra Netters, MD [place]
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
## 2017-08-13 ENCOUNTER — Inpatient Hospital Stay (HOSPITAL_COMMUNITY): Payer: Medicare Other

## 2017-08-13 DIAGNOSIS — R945 Abnormal results of liver function studies: Secondary | ICD-10-CM

## 2017-08-13 DIAGNOSIS — D72829 Elevated white blood cell count, unspecified: Secondary | ICD-10-CM

## 2017-08-13 DIAGNOSIS — M545 Low back pain: Secondary | ICD-10-CM

## 2017-08-13 LAB — CBC WITH DIFFERENTIAL/PLATELET
Abs Immature Granulocytes: 0.2 10*3/uL — ABNORMAL HIGH (ref 0.0–0.1)
BASOS ABS: 0 10*3/uL (ref 0.0–0.1)
BASOS PCT: 0 %
EOS PCT: 1 %
Eosinophils Absolute: 0.3 10*3/uL (ref 0.0–0.7)
HCT: 31.6 % — ABNORMAL LOW (ref 39.0–52.0)
HEMOGLOBIN: 10.6 g/dL — AB (ref 13.0–17.0)
Immature Granulocytes: 1 %
LYMPHS PCT: 11 %
Lymphs Abs: 2.5 10*3/uL (ref 0.7–4.0)
MCH: 30.8 pg (ref 26.0–34.0)
MCHC: 33.5 g/dL (ref 30.0–36.0)
MCV: 91.9 fL (ref 78.0–100.0)
Monocytes Absolute: 2 10*3/uL — ABNORMAL HIGH (ref 0.1–1.0)
Monocytes Relative: 9 %
NEUTROS ABS: 17.9 10*3/uL — AB (ref 1.7–7.7)
Neutrophils Relative %: 78 %
PLATELETS: 664 10*3/uL — AB (ref 150–400)
RBC: 3.44 MIL/uL — AB (ref 4.22–5.81)
RDW: 13.3 % (ref 11.5–15.5)
WBC: 22.8 10*3/uL — AB (ref 4.0–10.5)

## 2017-08-13 MED ORDER — DOXYCYCLINE HYCLATE 100 MG PO TABS
100.0000 mg | ORAL_TABLET | Freq: Two times a day (BID) | ORAL | Status: DC
  Administered 2017-08-13 – 2017-08-19 (×13): 100 mg via ORAL
  Filled 2017-08-13 (×13): qty 1

## 2017-08-13 MED ORDER — SODIUM CHLORIDE 0.9 % IV SOLN: INTRAVENOUS | Status: DC
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
## 2017-08-13 NOTE — Progress Notes (Signed)
PPD Reading Note  PPD read and results entered in [place].  Result: 0 mm induration. Interpretation: Negative  If test not read within 48-72 hours of initial placement, patient advised to repeat in other arm 1-3 weeks after this test.

## 2017-08-14 ENCOUNTER — Inpatient Hospital Stay (HOSPITAL_COMMUNITY): Payer: Medicare Other

## 2017-08-14 DIAGNOSIS — R945 Abnormal results of liver function studies: Secondary | ICD-10-CM

## 2017-08-14 DIAGNOSIS — R7989 Other specified abnormal findings of blood chemistry: Secondary | ICD-10-CM

## 2017-08-14 LAB — CBC WITH DIFFERENTIAL/PLATELET
ABS IMMATURE GRANULOCYTES: 0.2 10*3/uL — AB (ref 0.0–0.1)
BASOS ABS: 0.1 10*3/uL (ref 0.0–0.1)
BASOS PCT: 0 %
EOS ABS: 0.3 10*3/uL (ref 0.0–0.7)
Eosinophils Relative: 1 %
HCT: 28.7 % — ABNORMAL LOW (ref 39.0–52.0)
Hemoglobin: 9.7 g/dL — ABNORMAL LOW (ref 13.0–17.0)
Immature Granulocytes: 1 %
Lymphocytes Relative: 8 %
Lymphs Abs: 1.9 10*3/uL (ref 0.7–4.0)
MCH: 30.9 pg (ref 26.0–34.0)
MCHC: 33.8 g/dL (ref 30.0–36.0)
MCV: 91.4 fL (ref 78.0–100.0)
MONO ABS: 2.5 10*3/uL — AB (ref 0.1–1.0)
Monocytes Relative: 10 %
NEUTROS ABS: 20 10*3/uL — AB (ref 1.7–7.7)
Neutrophils Relative %: 80 %
PLATELETS: 621 10*3/uL — AB (ref 150–400)
RBC: 3.14 MIL/uL — ABNORMAL LOW (ref 4.22–5.81)
RDW: 13.3 % (ref 11.5–15.5)
WBC: 24.9 10*3/uL — AB (ref 4.0–10.5)

## 2017-08-14 LAB — COMPREHENSIVE METABOLIC PANEL
ALT: 69 U/L — AB (ref 17–63)
ANION GAP: 10 (ref 5–15)
AST: 40 U/L (ref 15–41)
Albumin: 1.9 g/dL — ABNORMAL LOW (ref 3.5–5.0)
Alkaline Phosphatase: 166 U/L — ABNORMAL HIGH (ref 38–126)
BUN: 10 mg/dL (ref 6–20)
CHLORIDE: 103 mmol/L (ref 101–111)
CO2: 22 mmol/L (ref 22–32)
CREATININE: 0.98 mg/dL (ref 0.61–1.24)
Calcium: 8.6 mg/dL — ABNORMAL LOW (ref 8.9–10.3)
GFR calc non Af Amer: >60 mL/min (ref >60)
Glucose, Bld: 113 mg/dL — ABNORMAL HIGH (ref 65–99)
Potassium: 4 mmol/L (ref 3.5–5.1)
Sodium: 135 mmol/L (ref 135–145)
Total Bilirubin: 1 mg/dL (ref 0.3–1.2)
Total Protein: 6.6 g/dL (ref 6.5–8.1)

## 2017-08-14 LAB — ACID FAST SMEAR (AFB, MYCOBACTERIA): Acid Fast Smear: NEGATIVE

## 2017-08-14 LAB — POTASSIUM: POTASSIUM: 4.1 mmol/L (ref 3.5–5.1)

## 2017-08-14 MED ORDER — SODIUM CHLORIDE 3 % IN NEBU
4.0000 mL | INHALATION_SOLUTION | Freq: Every day | RESPIRATORY_TRACT | Status: DC
  Filled 2017-08-14: qty 4

## 2017-08-14 MED ORDER — SODIUM CHLORIDE 3 % IN NEBU
4.0000 mL | INHALATION_SOLUTION | Freq: Every day | RESPIRATORY_TRACT | Status: AC
  Administered 2017-08-14 – 2017-08-15 (×2): 4 mL via RESPIRATORY_TRACT
  Filled 2017-08-14 (×2): qty 4
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
## 2017-08-15 DIAGNOSIS — D72829 Elevated white blood cell count, unspecified: Secondary | ICD-10-CM

## 2017-08-15 LAB — CBC WITH DIFFERENTIAL/PLATELET
ABS IMMATURE GRANULOCYTES: 0.3 10*3/uL — AB (ref 0.0–0.1)
BASOS ABS: 0 10*3/uL (ref 0.0–0.1)
Basophils Relative: 0 %
EOS PCT: 1 %
Eosinophils Absolute: 0.3 10*3/uL (ref 0.0–0.7)
HCT: 29.5 % — ABNORMAL LOW (ref 39.0–52.0)
Hemoglobin: 9.9 g/dL — ABNORMAL LOW (ref 13.0–17.0)
Immature Granulocytes: 1 %
LYMPHS ABS: 2.5 10*3/uL (ref 0.7–4.0)
LYMPHS PCT: 11 %
MCH: 30.4 pg (ref 26.0–34.0)
MCHC: 33.6 g/dL (ref 30.0–36.0)
MCV: 90.5 fL (ref 78.0–100.0)
MONO ABS: 2.5 10*3/uL — AB (ref 0.1–1.0)
Monocytes Relative: 11 %
Neutro Abs: 16.8 10*3/uL — ABNORMAL HIGH (ref 1.7–7.7)
Neutrophils Relative %: 76 %
PLATELETS: 713 10*3/uL — AB (ref 150–400)
RBC: 3.26 MIL/uL — AB (ref 4.22–5.81)
RDW: 13.3 % (ref 11.5–15.5)
WBC: 22.4 10*3/uL — ABNORMAL HIGH (ref 4.0–10.5)

## 2017-08-15 LAB — QUANTIFERON-TB GOLD PLUS: QuantiFERON-TB Gold Plus: UNDETERMINED

## 2017-08-15 LAB — HEPATITIS PANEL, ACUTE
HCV Ab: <0.1 s/co ratio (ref 0.0–0.9)
HEP B S AG: NEGATIVE
Hep A IgM: NEGATIVE
Hep B C IgM: NEGATIVE

## 2017-08-15 LAB — QUANTIFERON-TB GOLD PLUS (RQFGPL)
QUANTIFERON TB1 AG VALUE: 0.03 [IU]/mL
QUANTIFERON TB2 AG VALUE: 0.02 [IU]/mL
QuantiFERON Mitogen Value: 0.3 IU/mL
QuantiFERON Nil Value: 0.04 IU/mL

## 2017-08-15 LAB — STREP PNEUMONIAE URINARY ANTIGEN: Strep Pneumo Urinary Antigen: NEGATIVE

## 2017-08-15 NOTE — Progress Notes (Addendum)
Chart reviewed.  AFB negative x2, quantiferon gold negative.  Has had intermittent fevers to 101.8 in last 24 hours which he attributes to the heat being up in the room. Note also, he has a right femoral abscess/boil on doxycycline per primary.  Abnormal CT with diffuse bilateral nodules.  Rule out TB pending.  Plan for bronchoscopy on 5/22 at 10:00 am.    Plan: Pt to be NPO after MN on 5/23  FOB at 10:00 AM on Thursday per Dr. Lamonte Sakai  Follow AFB, cultures Patient updated on plan of care.    Noe Gens, NP-C  Pulmonary & Critical Care Pgr: [phone] or if no answer [phone] 08/15/2017, 2:02 PM
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
## 2017-08-15 NOTE — Progress Notes (Signed)
Sputum sample obtain from pt and given to RN to send down to the lab.

## 2017-08-16 LAB — BASIC METABOLIC PANEL
Anion gap: 12 (ref 5–15)
BUN: 15 mg/dL (ref 6–20)
CALCIUM: 9 mg/dL (ref 8.9–10.3)
CO2: 19 mmol/L — ABNORMAL LOW (ref 22–32)
Chloride: 102 mmol/L (ref 101–111)
Creatinine, Ser: 1.03 mg/dL (ref 0.61–1.24)
GFR calc Af Amer: >60 mL/min (ref >60)
GLUCOSE: 101 mg/dL — AB (ref 65–99)
Potassium: 4.4 mmol/L (ref 3.5–5.1)
Sodium: 133 mmol/L — ABNORMAL LOW (ref 135–145)

## 2017-08-16 LAB — CBC
HCT: 32.5 % — ABNORMAL LOW (ref 39.0–52.0)
HEMOGLOBIN: 10.7 g/dL — AB (ref 13.0–17.0)
MCH: 30.8 pg (ref 26.0–34.0)
MCHC: 32.9 g/dL (ref 30.0–36.0)
MCV: 93.7 fL (ref 78.0–100.0)
Platelets: 733 10*3/uL — ABNORMAL HIGH (ref 150–400)
RBC: 3.47 MIL/uL — ABNORMAL LOW (ref 4.22–5.81)
RDW: 14 % (ref 11.5–15.5)
WBC: 26.4 10*3/uL — ABNORMAL HIGH (ref 4.0–10.5)
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
## 2017-08-17 ENCOUNTER — Inpatient Hospital Stay (HOSPITAL_COMMUNITY): Payer: Medicare Other

## 2017-08-17 ENCOUNTER — Encounter (HOSPITAL_COMMUNITY)
Admission: EM | Disposition: A | Discharge status: Home / Self Care | Payer: Self-pay | Source: Home / Self Care | Attending: Family Medicine

## 2017-08-17 ENCOUNTER — Encounter (HOSPITAL_COMMUNITY): Payer: Self-pay | Admitting: Emergency Medicine

## 2017-08-17 HISTORY — PX: VIDEO BRONCHOSCOPY: SHX5072

## 2017-08-17 LAB — PROTIME-INR
INR: 1.1
Prothrombin Time: 14.1 seconds (ref 11.4–15.2)

## 2017-08-17 LAB — ACID FAST SMEAR (AFB): ACID FAST SMEAR - AFSCU2: NEGATIVE

## 2017-08-17 LAB — BASIC METABOLIC PANEL
Anion gap: 12 (ref 5–15)
BUN: 15 mg/dL (ref 6–20)
CHLORIDE: 102 mmol/L (ref 101–111)
CO2: 19 mmol/L — ABNORMAL LOW (ref 22–32)
CREATININE: 1.09 mg/dL (ref 0.61–1.24)
Calcium: 9 mg/dL (ref 8.9–10.3)
GFR calc Af Amer: >60 mL/min (ref >60)
GFR calc non Af Amer: >60 mL/min (ref >60)
Glucose, Bld: 97 mg/dL (ref 65–99)
Potassium: 4.7 mmol/L (ref 3.5–5.1)
SODIUM: 133 mmol/L — AB (ref 135–145)

## 2017-08-17 LAB — CBC
HCT: 29.9 % — ABNORMAL LOW (ref 39.0–52.0)
Hemoglobin: 9.8 g/dL — ABNORMAL LOW (ref 13.0–17.0)
MCH: 30.4 pg (ref 26.0–34.0)
MCHC: 32.8 g/dL (ref 30.0–36.0)
MCV: 92.9 fL (ref 78.0–100.0)
PLATELETS: 769 10*3/uL — AB (ref 150–400)
RBC: 3.22 MIL/uL — ABNORMAL LOW (ref 4.22–5.81)
RDW: 14.4 % (ref 11.5–15.5)
WBC: 26.4 10*3/uL — AB (ref 4.0–10.5)

## 2017-08-17 LAB — LEGIONELLA PNEUMOPHILA SEROGP 1 UR AG: L. pneumophila Serogp 1 Ur Ag: NEGATIVE

## 2017-08-17 LAB — BODY FLUID CELL COUNT WITH DIFFERENTIAL
Eos, Fluid: 0 %
LYMPHS FL: 5 %
Monocyte-Macrophage-Serous Fluid: 54 % (ref 50–90)
Neutrophil Count, Fluid: 41 % — ABNORMAL HIGH (ref 0–25)
Total Nucleated Cell Count, Fluid: 108 cu mm (ref 0–1000)

## 2017-08-17 SURGERY — BRONCHOSCOPY, WITH FLUOROSCOPY
Anesthesia: Moderate Sedation | Laterality: Bilateral

## 2017-08-17 MED ORDER — MIDAZOLAM HCL 5 MG/ML IJ SOLN
INTRAMUSCULAR | Status: AC
  Filled 2017-08-17: qty 2

## 2017-08-17 MED ORDER — LIDOCAINE HCL 2 % EX GEL
1.0000 "application " | Freq: Once | CUTANEOUS | Status: DC
  Filled 2017-08-17: qty 5

## 2017-08-17 MED ORDER — PHENYLEPHRINE HCL 0.25 % NA SOLN
NASAL | Status: DC | PRN
  Administered 2017-08-17: 2 via NASAL

## 2017-08-17 MED ORDER — FENTANYL CITRATE (PF) 100 MCG/2ML IJ SOLN
INTRAMUSCULAR | Status: AC
  Filled 2017-08-17: qty 4

## 2017-08-17 MED ORDER — FENTANYL CITRATE (PF) 100 MCG/2ML IJ SOLN
INTRAMUSCULAR | Status: DC | PRN
  Administered 2017-08-17 (×2): 25 ug via INTRAVENOUS
  Administered 2017-08-17 (×2): 50 ug via INTRAVENOUS
  Administered 2017-08-17: 25 ug via INTRAVENOUS

## 2017-08-17 MED ORDER — PHENYLEPHRINE HCL 0.25 % NA SOLN
1.0000 | Freq: Four times a day (QID) | NASAL | Status: DC | PRN
  Filled 2017-08-17: qty 15

## 2017-08-17 MED ORDER — MIDAZOLAM HCL 10 MG/2ML IJ SOLN
INTRAMUSCULAR | Status: DC | PRN
  Administered 2017-08-17: 2 mg via INTRAVENOUS
  Administered 2017-08-17: 1 mg via INTRAVENOUS
  Administered 2017-08-17: 3 mg via INTRAVENOUS
  Administered 2017-08-17: 1 mg via INTRAVENOUS

## 2017-08-17 MED ORDER — LIDOCAINE HCL (PF) 1 % IJ SOLN
INTRAMUSCULAR | Status: DC | PRN
  Administered 2017-08-17: 6 mL

## 2017-08-17 MED ORDER — SODIUM CHLORIDE 0.9 % IV SOLN
INTRAVENOUS | Status: DC
  Administered 2017-08-17: 10:00:00 via INTRAVENOUS

## 2017-08-17 MED ORDER — LIDOCAINE HCL URETHRAL/MUCOSAL 2 % EX GEL
CUTANEOUS | Status: DC | PRN
  Administered 2017-08-17: 1

## 2017-08-17 NOTE — Interval H&P Note (Signed)
PCCM Interval Note  Pt has bilateral scattered nodules with variable sizes, some spiculation, some poorly formed. Etiology unclear. His AFB x 2, PPD, Quant GOLD are all negative.   May be clinically better on Abx for CAP although the imaging is inconsistent w this dx.   Plan for FOB today, TBBx if he is tolerating well.   May be reasonable to send APC, CEA, CA-19.9 to screen for malignancy  No barriers to proceeding identified. Pt understands risks/ benefits   Baltazar Apo, MD, PhD 08/17/2017, 10:15 AM [facility] [phone] or if no answer [phone]
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
## 2017-08-17 NOTE — Progress Notes (Signed)
Video bronchoscopy performed Intervention bronchial washings Intervention bronchial biopsies Pt tolerated well  Deaunna Olarte David RRT  

## 2017-08-17 NOTE — Op Note (Signed)
[facility] Cardiopulmonary Patient Name: Juan Mcintosh Pocedure Date: 08/17/2017 MRN: [id number] Attending MD: Collene Gobble , MD Date of Birth: 05-07-1970 CSN: Finalized Age: 47 Admit Type: Inpatient Gender: Male Procedure:            Bronchoscopy Indications:          Multiple pulmonary nodules Providers:            Collene Gobble, MD, Cherre Huger RRT, RCP, Ashley Mariner                        RRT,RCP Referring MD:          Medicines:            Midazolam 7 mg IV, Fentanyl 175 mcg IV, Lidocaine 1%                        applied to cords 12 mL, Lidocaine 1% applied to the                        tracheobronchial tree 20 mL Complications:        No immediate complications Estimated Blood Loss: Estimated blood loss: none. Procedure:            Pre-Anesthesia Assessment:                       - A History and Physical has been performed. Patient                        meds and allergies have been reviewed. The risks and                        benefits of the procedure and the sedation options and                        risks were discussed with the patient. All questions                        were answered and informed consent was obtained.                        Patient identification and proposed procedure were                        verified prior to the procedure by the physician in the                        procedure room. Mental Status Examination: alert and                        oriented. Airway Examination: normal oropharyngeal                        airway. Respiratory Examination: clear to auscultation.                        CV Examination: normal. ASA Grade Assessment: II - A                        patient with mild systemic disease. After reviewing the  risks and benefits, the patient was deemed in                        satisfactory condition to undergo the procedure. The                        anesthesia plan was to use  moderate sedation /                        analgesia (conscious sedation). Immediately prior to                        administration of medications, the patient was                        re-assessed for adequacy to receive sedatives. The                        heart rate, respiratory rate, oxygen saturations, blood                        pressure, adequacy of pulmonary ventilation, and                        response to care were monitored throughout the                        procedure. The physical status of the patient was                        re-assessed after the procedure.                       After obtaining informed consent, the bronchoscope was                        passed under direct vision. Throughout the procedure,                        the patient's blood pressure, pulse, and oxygen                        saturations were monitored continuously. the [id number]                        [id number] scope was introduced through the right nostril                        and advanced to the tracheobronchial tree. The                        procedure was accomplished without difficulty. The                        patient tolerated the procedure fairly well. The total                        duration of the procedure was 26 minutes. Total                        fluoroscopy time  was 42 seconds. Scope In: 10:24:15 AM Scope Out: 10:44:46 AM Findings:      Trachea/Carina Abnormalities: A small non-obstructing polypoid lesion       was found at approximately 10 o'clock, 1 cm from the bifurcation       (carina) in the lower trachea.      The nasopharynx/oropharynx appears normal. The larynx appears normal.       The vocal cords appear normal. The subglottic space is normal. The       trachea is of normal caliber. The carina is sharp.      The tracheobronchial tree of the left lung was examined to at least the       first subsegmental level. Bronchial mucosa and anatomy in the left lung        are normal; there are no endobronchial lesions, and no secretions.      The tracheobronchial tree of the right lung was examined to at least the       first subsegmental level. Bronchial mucosa and anatomy in the right lung       are normal; there are no endobronchial lesions, and no secretions.      Endobronchial biopsies of a lesion were performed in the trachea using a       forceps and sent for histopathology examination. Two samples were       obtained.      Transbronchial biopsies of a lesion were performed in the posterior       segment of the right upper lobe and in the anterior segment of the right       upper lobe using forceps and sent for histopathology examination. The       procedure was guided by fluoroscopy. Biopsy of lung tissue was obtained.       Five biopsy passes were performed. Five biopsy samples were obtained.      Bronchoalveolar lavage was performed in the RUL anterior segment (B3) of       the lung and sent for cell count, bacterial culture, viral smears &       culture, and fungal & AFB analysis and cytology. 60 mL of fluid were       instilled. 22 mL were returned. The return was clear. Impression:           - Multiple pulmonary nodules                       - A lesion was found in the lower trachea.                       - The airway examination of the left lung was normal.                       - The airway examination of the right lung was normal.                       - An endobronchial biopsy was performed in the distal                        trachea.                       - Transbronchial lung biopsies were performed in the  RUL.                       - Bronchoalveolar lavage was performed in the RUL. Moderate Sedation:      Moderate (conscious) sedation was personally administered by the       endoscopist. The following parameters were monitored: oxygen saturation,       heart rate, blood pressure, respiratory rate, EKG, adequacy  of pulmonary       ventilation, and response to care. Total physician intraservice time was       26 minutes. Recommendation:       - Await BAL, biopsy, culture and cytology results.                       - Return patient to hospital ward for ongoing care. Procedure Code(s):    --- Professional ---                       [id number], Bronchoscopy, rigid or flexible, including                        fluoroscopic guidance, when performed; with                        transbronchial lung biopsy(s), single lobe                       29924, 59, Bronchoscopy, rigid or flexible, including                        fluoroscopic guidance, when performed; with bronchial                        or endobronchial biopsy(s), single or multiple sites                       26834, Bronchoscopy, rigid or flexible, including                        fluoroscopic guidance, when performed; with bronchial                        alveolar lavage                       99152, Moderate sedation services provided by the same                        physician or other qualified health care professional                        performing the diagnostic or therapeutic service that                        the sedation supports, requiring the presence of an                        independent trained observer to assist in the                        monitoring of the patient's level of consciousness and  physiological status; initial 15 minutes of                        intraservice time, patient age 65 years or older                       [phone], Moderate sedation services provided by the same                        physician or other qualified health care professional                        performing the diagnostic or therapeutic service that                        the sedation supports, requiring the presence of an                        independent trained observer to assist in the                        monitoring  of the patient's level of consciousness and                        physiological status; each additional 15 minutes                        intraservice time (List separately in addition to code                        for primary service) Diagnosis Code(s):    --- Professional ---                       R91.8, Other nonspecific abnormal finding of lung field                       J39.8, Other specified diseases of upper respiratory                        tract CPT copyright 2017 [employer]. All rights reserved. The codes documented in this report are preliminary and upon coder review may  be revised to meet current compliance requirements. Collene Gobble, MD Collene Gobble, MD 08/17/2017 11:09:49 AM Number of Addenda: 0

## 2017-08-18 LAB — CBC
HCT: 28 % — ABNORMAL LOW (ref 39.0–52.0)
HEMOGLOBIN: 9.2 g/dL — AB (ref 13.0–17.0)
MCH: 30.4 pg (ref 26.0–34.0)
MCHC: 32.9 g/dL (ref 30.0–36.0)
MCV: 92.4 fL (ref 78.0–100.0)
PLATELETS: 882 10*3/uL — AB (ref 150–400)
RBC: 3.03 MIL/uL — AB (ref 4.22–5.81)
RDW: 14.7 % (ref 11.5–15.5)
WBC: 23.4 10*3/uL — AB (ref 4.0–10.5)

## 2017-08-18 LAB — BASIC METABOLIC PANEL
Anion gap: 13 (ref 5–15)
BUN: 15 mg/dL (ref 6–20)
CALCIUM: 9 mg/dL (ref 8.9–10.3)
CO2: 21 mmol/L — AB (ref 22–32)
Chloride: 100 mmol/L — ABNORMAL LOW (ref 101–111)
Creatinine, Ser: 0.99 mg/dL (ref 0.61–1.24)
GFR calc non Af Amer: >60 mL/min (ref >60)
Glucose, Bld: 119 mg/dL — ABNORMAL HIGH (ref 65–99)
Potassium: 4 mmol/L (ref 3.5–5.1)
SODIUM: 134 mmol/L — AB (ref 135–145)

## 2017-08-18 LAB — ACID FAST SMEAR (AFB, MYCOBACTERIA)

## 2017-08-18 LAB — ACID FAST SMEAR (AFB): ACID FAST SMEAR - AFSCU2: NEGATIVE

## 2017-08-18 MED ORDER — LIDOCAINE HCL (PF) 1 % IJ SOLN
INTRAMUSCULAR | Status: AC
  Filled 2017-08-18: qty 30

## 2017-08-18 MED ORDER — MUPIROCIN 2 % EX OINT
TOPICAL_OINTMENT | CUTANEOUS | Status: AC
  Administered 2017-08-18: 1
  Filled 2017-08-18: qty 22

## 2017-08-18 MED ORDER — LIDOCAINE HCL (PF) 1 % IJ SOLN
30.0000 mL | Freq: Once | INTRAMUSCULAR | Status: AC
  Administered 2017-08-18: 09:00:00 via INTRADERMAL
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
## 2017-08-18 NOTE — Procedures (Addendum)
Family Medicine Procedure note  Full daily progress note to follow  Patient was consented for a right groin abscess incision and drainage. This was placed in his paper chart.  Attending physician: Dr. Andrena Mews Performing Physician: Dr. Guadalupe Dawn  Pre-procedure diagnosis: right groin abscess Post-procedure diagnosis: right groin abscess  Anaesthetic used: 1% lidocaine without epinephrine  Procedure 3cm x 3cm fluctuant area in right groin was disinfected with alcohol swabs. Patient was given 61mL lidocaine without epinephrine for local anaesthesia. Once this area was anaesthetized satisfactorily, sterile drapes were applied to only reveal area for I&D. An iodine swab was further used to disinfect this area. A cruciform incision was made to cover the size of the abscess. A moderate amount of purulent substance was expressed from the area after incision. Initially incision depth was further extended until all purulent fluid satisfactorily expressed from wound. Further incision depth was further extended until it was felt that abscess capsule had been disrupted. A wick made of cut kerlix 4x4 was placed inside the wound. This wick was cut just long enough to connect to a larger 4x4 kerlix covering the wound. This was tapped loosely in place. All sharps disposed of, hemostasis satisfactory. Patient resting comfortably after procedure.  Wound dressing changes 2 times per day. Ok to remove for showering. Continue doxycycline.  Guadalupe Dawn MD PGY-1 Family Medicine Resident

## 2017-08-19 LAB — CBC WITH DIFFERENTIAL/PLATELET
Abs Immature Granulocytes: 0.5 10*3/uL — ABNORMAL HIGH (ref 0.0–0.1)
Basophils Absolute: 0 10*3/uL (ref 0.0–0.1)
Basophils Relative: 0 %
EOS PCT: 1 %
Eosinophils Absolute: 0.3 10*3/uL (ref 0.0–0.7)
HCT: 27.5 % — ABNORMAL LOW (ref 39.0–52.0)
HEMOGLOBIN: 8.8 g/dL — AB (ref 13.0–17.0)
Immature Granulocytes: 2 %
LYMPHS PCT: 11 %
Lymphs Abs: 2.1 10*3/uL (ref 0.7–4.0)
MCH: 29.9 pg (ref 26.0–34.0)
MCHC: 32 g/dL (ref 30.0–36.0)
MCV: 93.5 fL (ref 78.0–100.0)
Monocytes Absolute: 2.2 10*3/uL — ABNORMAL HIGH (ref 0.1–1.0)
Monocytes Relative: 11 %
Neutro Abs: 15.1 10*3/uL — ABNORMAL HIGH (ref 1.7–7.7)
Neutrophils Relative %: 75 %
PLATELETS: 735 10*3/uL — AB (ref 150–400)
RBC: 2.94 MIL/uL — AB (ref 4.22–5.81)
RDW: 14.8 % (ref 11.5–15.5)
WBC: 20.2 10*3/uL — AB (ref 4.0–10.5)

## 2017-08-19 LAB — CULTURE, BAL-QUANTITATIVE

## 2017-08-19 LAB — CULTURE, BAL-QUANTITATIVE W GRAM STAIN
Culture: 60000 — AB
Special Requests: NORMAL

## 2017-08-19 MED ORDER — DOXYCYCLINE HYCLATE 100 MG PO TABS: 100.0000 mg | ORAL_TABLET | Freq: Two times a day (BID) | ORAL | 0 refills | Status: DC

## 2017-08-19 NOTE — Progress Notes (Signed)
Juan Mcintosh to be D/C'd  per MD order. Discussed with the patient and all questions fully answered.  VSS, Skin clean, dry and intact without evidence of skin break down, no evidence of skin tears noted.  IV catheter discontinued intact. Site without signs and symptoms of complications. Dressing and pressure applied.  An After Visit Summary was printed and given to the patient. Patient received prescription.  D/c education completed with patient/family including follow up instructions, medication list, d/c activities limitations if indicated, with other d/c instructions as indicated by MD - patient able to verbalize understanding, all questions fully answered.   Patient instructed to return to ED, call 911, or call MD for any changes in condition.   Patient to be escorted via [place], and D/C home via private auto.
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
## 2017-08-19 NOTE — Discharge Instructions (Signed)
You were initially admitted with shortness of breath and chest pain. These were felt to be due to lung nodules that were discovered on imaging when you came in. You had a bronchoscopy performed and tissue samples were done. While some of these results are back and look very reassuring they are not all back. Please follow up with the pulmonologist by calling the number on your card on Tuesday.  Regarding your right groin boil. Please change the dressing covering this 2 times per day. It is NOT a good idea to wash this area with bleach and alcohol as these substances are very damaging to the healing wound. It is ok to just run plain water over that area. Keep applying the bactroban ointment 2 times per day. You are being sent home with an additional 2 days of an antibiotic called doxycycline. You will take this two times per day for the next two days.
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
## 2017-08-29 ENCOUNTER — Encounter: Attending: Internal Medicine | Primary: Internal Medicine

## 2017-08-29 ENCOUNTER — Ambulatory Visit: Payer: Medicare Other | Admitting: Pulmonary Disease

## 2017-08-29 ENCOUNTER — Encounter: Payer: Self-pay | Admitting: Pulmonary Disease

## 2017-08-29 VITALS — BP 132/70 | HR 105 | Ht 66.0 in | Wt 147.4 lb

## 2017-08-29 DIAGNOSIS — F17200 Nicotine dependence, unspecified, uncomplicated: Secondary | ICD-10-CM | POA: Diagnosis not present

## 2017-08-29 DIAGNOSIS — C8590 Non-Hodgkin lymphoma, unspecified, unspecified site: Secondary | ICD-10-CM | POA: Diagnosis not present

## 2017-08-29 MED ORDER — CEPHALEXIN 500 MG CAP
500 mg | ORAL_CAPSULE | ORAL | 0 refills | Status: DC
Start: 2017-08-29 — End: 2017-10-22

## 2017-08-29 NOTE — Telephone Encounter (Signed)
Please advise if an appointment is needed.

## 2017-08-29 NOTE — Telephone Encounter (Signed)
Pt request new medication for boils, stated Dr Madelyn Brunner is aware he gets boils.    He declined me looking for him an appt.    He said he was seen in [place] at [facility] ED 08/19/2017 and was prescribed:    Doxycycline-hyclate 100 mg tab/said he was given aquantity of 4 tabs.     Said the medication didn't completely clear up the boils    Pharmacy is [place], [place]

## 2017-08-29 NOTE — Telephone Encounter (Signed)
med approved and sent electronically
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
## 2017-08-29 NOTE — Patient Instructions (Signed)
I have reviewed the results of your bronchoscopy by Dr. Lamonte Sakai The biopsy shows that you have a condition called T-cell lymphoma which is a malignancy of your white blood cells We will refer you to oncology for further evaluation and treatment We will get pulmonary function test for evaluation of your lungs and rule out COPD given the smoking history Follow-up in 3 months.

## 2017-08-30 ENCOUNTER — Telehealth: Payer: Self-pay | Admitting: Hematology

## 2017-08-30 NOTE — Telephone Encounter (Signed)
Patient is aware medication was sent to the pharmacy.

## 2017-08-30 NOTE — Telephone Encounter (Signed)
New referral from [facility] for a dx of lymphoma. Pt has been scheduled for the pt to see Dr. Irene Limbo on 6/7 at 8am. Pt agreed to the appt date and time. Address given to the pt.
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
## 2017-09-01 ENCOUNTER — Inpatient Hospital Stay: Payer: Medicare Other

## 2017-09-01 ENCOUNTER — Inpatient Hospital Stay: Payer: Medicare Other | Attending: Hematology | Admitting: Hematology

## 2017-09-01 VITALS — BP 129/91 | HR 97 | Temp 98.2°F | Resp 18 | Ht 66.0 in | Wt 151.2 lb

## 2017-09-01 DIAGNOSIS — C8442 Peripheral T-cell lymphoma, not classified, intrathoracic lymph nodes: Secondary | ICD-10-CM | POA: Diagnosis not present

## 2017-09-01 DIAGNOSIS — M549 Dorsalgia, unspecified: Secondary | ICD-10-CM | POA: Insufficient documentation

## 2017-09-01 DIAGNOSIS — I10 Essential (primary) hypertension: Secondary | ICD-10-CM | POA: Insufficient documentation

## 2017-09-01 DIAGNOSIS — G8929 Other chronic pain: Secondary | ICD-10-CM | POA: Insufficient documentation

## 2017-09-01 DIAGNOSIS — R918 Other nonspecific abnormal finding of lung field: Secondary | ICD-10-CM | POA: Diagnosis not present

## 2017-09-01 DIAGNOSIS — Z933 Colostomy status: Secondary | ICD-10-CM | POA: Insufficient documentation

## 2017-09-01 DIAGNOSIS — D72829 Elevated white blood cell count, unspecified: Secondary | ICD-10-CM | POA: Diagnosis not present

## 2017-09-01 DIAGNOSIS — Z87828 Personal history of other (healed) physical injury and trauma: Secondary | ICD-10-CM | POA: Insufficient documentation

## 2017-09-01 DIAGNOSIS — R05 Cough: Secondary | ICD-10-CM | POA: Insufficient documentation

## 2017-09-01 DIAGNOSIS — R0602 Shortness of breath: Secondary | ICD-10-CM | POA: Insufficient documentation

## 2017-09-01 DIAGNOSIS — D649 Anemia, unspecified: Secondary | ICD-10-CM | POA: Insufficient documentation

## 2017-09-01 DIAGNOSIS — Z87891 Personal history of nicotine dependence: Secondary | ICD-10-CM | POA: Insufficient documentation

## 2017-09-01 DIAGNOSIS — E876 Hypokalemia: Secondary | ICD-10-CM | POA: Diagnosis not present

## 2017-09-01 LAB — CBC WITH DIFFERENTIAL/PLATELET
Basophils Absolute: 0 10*3/uL (ref 0.0–0.1)
Basophils Relative: 0 %
EOS PCT: 2 %
Eosinophils Absolute: 0.1 10*3/uL (ref 0.0–0.5)
HCT: 27.9 % — ABNORMAL LOW (ref 38.4–49.9)
HEMOGLOBIN: 8.4 g/dL — AB (ref 13.0–17.1)
LYMPHS ABS: 1.9 10*3/uL (ref 0.9–3.3)
LYMPHS PCT: 21 %
MCH: 28.2 pg (ref 27.2–33.4)
MCHC: 30.1 g/dL — ABNORMAL LOW (ref 32.0–36.0)
MCV: 93.6 fL (ref 79.3–98.0)
Monocytes Absolute: 0.7 10*3/uL (ref 0.1–0.9)
Monocytes Relative: 8 %
NEUTROS PCT: 69 %
Neutro Abs: 6.3 10*3/uL (ref 1.5–6.5)
Platelets: 478 10*3/uL — ABNORMAL HIGH (ref 140–400)
RBC: 2.98 MIL/uL — AB (ref 4.20–5.82)
RDW: 15 % — ABNORMAL HIGH (ref 11.0–14.6)
WBC: 9 10*3/uL (ref 4.0–10.3)

## 2017-09-01 LAB — CMP (CANCER CENTER ONLY)
ALK PHOS: 134 U/L (ref 40–150)
ALT: 23 U/L (ref 0–55)
AST: 12 U/L (ref 5–34)
Albumin: 2.4 g/dL — ABNORMAL LOW (ref 3.5–5.0)
Anion gap: 10 (ref 3–11)
BUN: 11 mg/dL (ref 7–26)
CALCIUM: 9.3 mg/dL (ref 8.4–10.4)
CO2: 23 mmol/L (ref 22–29)
Chloride: 108 mmol/L (ref 98–109)
Creatinine: 1.03 mg/dL (ref 0.70–1.30)
GFR, Est AFR Am: >60 mL/min (ref >60)
GFR, Estimated: >60 mL/min (ref >60)
Glucose, Bld: 106 mg/dL (ref 70–140)
Potassium: 3.8 mmol/L (ref 3.5–5.1)
SODIUM: 141 mmol/L (ref 136–145)
Total Bilirubin: <0.2 mg/dL — ABNORMAL LOW (ref 0.2–1.2)
Total Protein: 7.5 g/dL (ref 6.4–8.3)

## 2017-09-01 LAB — RETICULOCYTES
RBC.: 2.98 MIL/uL — ABNORMAL LOW (ref 4.20–5.82)
Retic Count, Absolute: 104.3 10*3/uL — ABNORMAL HIGH (ref 34.8–93.9)
Retic Ct Pct: 3.5 % — ABNORMAL HIGH (ref 0.8–1.8)

## 2017-09-01 LAB — LACTATE DEHYDROGENASE: LDH: 113 U/L — ABNORMAL LOW (ref 125–245)

## 2017-09-01 LAB — PHOSPHORUS: Phosphorus: 2.6 mg/dL (ref 2.5–4.6)

## 2017-09-01 LAB — MAGNESIUM: Magnesium: 1.5 mg/dL — ABNORMAL LOW (ref 1.7–2.4)

## 2017-09-02 LAB — HEPATITIS B SURFACE ANTIGEN: Hepatitis B Surface Ag: NEGATIVE

## 2017-09-02 LAB — HEPATITIS B CORE ANTIBODY, TOTAL: HEP B C TOTAL AB: NEGATIVE

## 2017-09-02 LAB — HEPATITIS C ANTIBODY

## 2017-09-06 ENCOUNTER — Encounter (HOSPITAL_COMMUNITY)
Admission: RE | Admit: 2017-09-06 | Discharge: 2017-09-06 | Disposition: A | Discharge status: Home / Self Care | Payer: Medicare Other | Source: Ambulatory Visit | Attending: Hematology | Admitting: Hematology

## 2017-09-06 DIAGNOSIS — C8442 Peripheral T-cell lymphoma, not classified, intrathoracic lymph nodes: Secondary | ICD-10-CM | POA: Diagnosis present

## 2017-09-06 LAB — GLUCOSE, CAPILLARY: Glucose-Capillary: 83 mg/dL (ref 65–99)

## 2017-09-06 MED ORDER — FLUDEOXYGLUCOSE F - 18 (FDG) INJECTION
7.5000 | Freq: Once | INTRAVENOUS | Status: AC | PRN
Start: 2017-09-06 — End: 2017-09-06
  Administered 2017-09-06: 7.5 via INTRAVENOUS

## 2017-09-11 ENCOUNTER — Other Ambulatory Visit: Payer: Self-pay | Admitting: Radiology
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
## 2017-09-12 ENCOUNTER — Encounter: Payer: Self-pay | Admitting: Hematology

## 2017-09-12 ENCOUNTER — Inpatient Hospital Stay (HOSPITAL_BASED_OUTPATIENT_CLINIC_OR_DEPARTMENT_OTHER): Payer: Medicare Other | Admitting: Hematology

## 2017-09-12 VITALS — BP 129/73 | HR 103 | Temp 98.1°F | Resp 18 | Ht 66.0 in | Wt 157.8 lb

## 2017-09-12 DIAGNOSIS — R05 Cough: Secondary | ICD-10-CM

## 2017-09-12 DIAGNOSIS — E876 Hypokalemia: Secondary | ICD-10-CM | POA: Diagnosis not present

## 2017-09-12 DIAGNOSIS — I10 Essential (primary) hypertension: Secondary | ICD-10-CM

## 2017-09-12 DIAGNOSIS — D649 Anemia, unspecified: Secondary | ICD-10-CM

## 2017-09-12 DIAGNOSIS — Z933 Colostomy status: Secondary | ICD-10-CM

## 2017-09-12 DIAGNOSIS — R918 Other nonspecific abnormal finding of lung field: Secondary | ICD-10-CM

## 2017-09-12 DIAGNOSIS — C8442 Peripheral T-cell lymphoma, not classified, intrathoracic lymph nodes: Secondary | ICD-10-CM | POA: Diagnosis not present

## 2017-09-12 DIAGNOSIS — D72829 Elevated white blood cell count, unspecified: Secondary | ICD-10-CM | POA: Diagnosis not present

## 2017-09-12 DIAGNOSIS — Z87828 Personal history of other (healed) physical injury and trauma: Secondary | ICD-10-CM

## 2017-09-12 DIAGNOSIS — Z87891 Personal history of nicotine dependence: Secondary | ICD-10-CM | POA: Diagnosis not present

## 2017-09-13 ENCOUNTER — Ambulatory Visit (HOSPITAL_COMMUNITY)
Admission: RE | Admit: 2017-09-13 | Discharge: 2017-09-13 | Disposition: A | Discharge status: Home / Self Care | Payer: Medicare Other | Source: Ambulatory Visit | Attending: Hematology | Admitting: Hematology

## 2017-09-13 ENCOUNTER — Other Ambulatory Visit: Payer: Self-pay | Admitting: Hematology

## 2017-09-13 ENCOUNTER — Encounter (HOSPITAL_COMMUNITY): Payer: Self-pay

## 2017-09-13 DIAGNOSIS — D7589 Other specified diseases of blood and blood-forming organs: Secondary | ICD-10-CM | POA: Diagnosis not present

## 2017-09-13 DIAGNOSIS — Z9889 Other specified postprocedural states: Secondary | ICD-10-CM | POA: Insufficient documentation

## 2017-09-13 DIAGNOSIS — C8442 Peripheral T-cell lymphoma, not classified, intrathoracic lymph nodes: Secondary | ICD-10-CM | POA: Diagnosis present

## 2017-09-13 DIAGNOSIS — Z8249 Family history of ischemic heart disease and other diseases of the circulatory system: Secondary | ICD-10-CM | POA: Diagnosis not present

## 2017-09-13 DIAGNOSIS — I10 Essential (primary) hypertension: Secondary | ICD-10-CM | POA: Diagnosis not present

## 2017-09-13 DIAGNOSIS — Z888 Allergy status to other drugs, medicaments and biological substances status: Secondary | ICD-10-CM | POA: Insufficient documentation

## 2017-09-13 DIAGNOSIS — Z87891 Personal history of nicotine dependence: Secondary | ICD-10-CM | POA: Diagnosis not present

## 2017-09-13 DIAGNOSIS — Z79899 Other long term (current) drug therapy: Secondary | ICD-10-CM | POA: Insufficient documentation

## 2017-09-13 DIAGNOSIS — Z87828 Personal history of other (healed) physical injury and trauma: Secondary | ICD-10-CM | POA: Diagnosis not present

## 2017-09-13 DIAGNOSIS — D649 Anemia, unspecified: Secondary | ICD-10-CM | POA: Diagnosis not present

## 2017-09-13 HISTORY — PX: IR BONE MARROW BIOPSY & ASPIRATION: IMG5727

## 2017-09-13 HISTORY — PX: IR FLUORO GUIDED NEEDLE PLC ASPIRATION/INJECTION LOC: IMG2395

## 2017-09-13 HISTORY — PX: IR IMAGING GUIDED PORT INSERTION: IMG5740

## 2017-09-13 LAB — CBC WITH DIFFERENTIAL/PLATELET
BASOS ABS: 0 10*3/uL (ref 0.0–0.1)
Basophils Relative: 0 %
EOS PCT: 3 %
Eosinophils Absolute: 0.2 10*3/uL (ref 0.0–0.7)
HEMATOCRIT: 34.1 % — AB (ref 39.0–52.0)
HEMOGLOBIN: 10.5 g/dL — AB (ref 13.0–17.0)
LYMPHS ABS: 1.9 10*3/uL (ref 0.7–4.0)
LYMPHS PCT: 35 %
MCH: 29.4 pg (ref 26.0–34.0)
MCHC: 30.8 g/dL (ref 30.0–36.0)
MCV: 95.5 fL (ref 78.0–100.0)
Monocytes Absolute: 0.7 10*3/uL (ref 0.1–1.0)
Monocytes Relative: 12 %
NEUTROS ABS: 2.8 10*3/uL (ref 1.7–7.7)
Neutrophils Relative %: 50 %
Platelets: 363 10*3/uL (ref 150–400)
RBC: 3.57 MIL/uL — ABNORMAL LOW (ref 4.22–5.81)
RDW: 16.4 % — ABNORMAL HIGH (ref 11.5–15.5)
WBC: 5.6 10*3/uL (ref 4.0–10.5)

## 2017-09-13 LAB — PROTIME-INR
INR: 0.98
Prothrombin Time: 12.9 seconds (ref 11.4–15.2)

## 2017-09-13 MED ORDER — MIDAZOLAM HCL 2 MG/2ML IJ SOLN
INTRAMUSCULAR | Status: AC
  Filled 2017-09-13: qty 4

## 2017-09-13 MED ORDER — FENTANYL CITRATE (PF) 100 MCG/2ML IJ SOLN
INTRAMUSCULAR | Status: AC | PRN
  Administered 2017-09-13 (×3): 50 ug via INTRAVENOUS

## 2017-09-13 MED ORDER — LIDOCAINE HCL 1 % IJ SOLN
INTRAMUSCULAR | Status: AC
  Filled 2017-09-13: qty 20

## 2017-09-13 MED ORDER — MIDAZOLAM HCL 2 MG/2ML IJ SOLN
INTRAMUSCULAR | Status: AC | PRN
  Administered 2017-09-13: 2 mg via INTRAVENOUS
  Administered 2017-09-13: 1 mg via INTRAVENOUS

## 2017-09-13 MED ORDER — CEFAZOLIN SODIUM-DEXTROSE 2-4 GM/100ML-% IV SOLN
2.0000 g | INTRAVENOUS | Status: AC
  Administered 2017-09-13: 2 g via INTRAVENOUS

## 2017-09-13 MED ORDER — LIDOCAINE HCL 1 % IJ SOLN
INTRAMUSCULAR | Status: AC | PRN
  Administered 2017-09-13: 15 mL

## 2017-09-13 MED ORDER — NALOXONE HCL 0.4 MG/ML IJ SOLN
INTRAMUSCULAR | Status: AC
  Filled 2017-09-13: qty 1

## 2017-09-13 MED ORDER — FLUMAZENIL 0.5 MG/5ML IV SOLN
INTRAVENOUS | Status: AC
  Filled 2017-09-13: qty 5

## 2017-09-13 MED ORDER — FENTANYL CITRATE (PF) 100 MCG/2ML IJ SOLN
INTRAMUSCULAR | Status: AC
  Filled 2017-09-13: qty 2

## 2017-09-13 MED ORDER — FENTANYL CITRATE (PF) 100 MCG/2ML IJ SOLN
INTRAMUSCULAR | Status: AC
  Filled 2017-09-13: qty 4

## 2017-09-13 MED ORDER — SODIUM CHLORIDE 0.9 % IV SOLN
INTRAVENOUS | Status: DC
  Administered 2017-09-13: 08:00:00 via INTRAVENOUS

## 2017-09-13 MED ORDER — MIDAZOLAM HCL 2 MG/2ML IJ SOLN
INTRAMUSCULAR | Status: AC
  Filled 2017-09-13: qty 2

## 2017-09-13 MED ORDER — CEFAZOLIN SODIUM-DEXTROSE 2-4 GM/100ML-% IV SOLN
INTRAVENOUS | Status: AC
  Filled 2017-09-13: qty 100

## 2017-09-13 MED ORDER — HEPARIN SOD (PORK) LOCK FLUSH 100 UNIT/ML IV SOLN
INTRAVENOUS | Status: AC | PRN
  Administered 2017-09-13: 500 [IU] via INTRAVENOUS

## 2017-09-13 MED ORDER — HEPARIN SOD (PORK) LOCK FLUSH 100 UNIT/ML IV SOLN
INTRAVENOUS | Status: AC
  Filled 2017-09-13: qty 5

## 2017-09-13 NOTE — Procedures (Signed)
Interventional Radiology Procedure Note  Procedure: Fluoro guided aspirate and core biopsy of right posterior iliac bone Complications: None Recommendations: - Bedrest supine x 1 hrs - OTC's PRN  Pain - Follow biopsy results  Signed,  Kharter Brew S. Malynda Smolinski, DO   

## 2017-09-13 NOTE — Sedation Documentation (Signed)
Patient is resting comfortably, snoring, in NAD. 

## 2017-09-13 NOTE — Discharge Instructions (Signed)
Moderate Conscious Sedation, Adult, Care After These instructions provide you with information about caring for yourself after your procedure. Your health care provider may also give you more specific instructions. Your treatment has been planned according to current medical practices, but problems sometimes occur. Call your health care provider if you have any problems or questions after your procedure. What can I expect after the procedure? After your procedure, it is common:  To feel sleepy for several hours.  To feel clumsy and have poor balance for several hours.  To have poor judgment for several hours.  To vomit if you eat too soon.  Follow these instructions at home: For at least 24 hours after the procedure:   Do not: ? Participate in activities where you could fall or become injured. ? Drive. ? Use heavy machinery. ? Drink alcohol. ? Take sleeping pills or medicines that cause drowsiness. ? Make important decisions or sign legal documents. ? Take care of children on your own.  Rest. Eating and drinking  Follow the diet recommended by your health care provider.  If you vomit: ? Drink water, juice, or soup when you can drink without vomiting. ? Make sure you have little or no nausea before eating solid foods. General instructions  Have a responsible adult stay with you until you are awake and alert.  Take over-the-counter and prescription medicines only as told by your health care provider.  If you smoke, do not smoke without supervision.  Keep all follow-up visits as told by your health care provider. This is important. Contact a health care provider if:  You keep feeling nauseous or you keep vomiting.  You feel light-headed.  You develop a rash.  You have a fever. Get help right away if:  You have trouble breathing. This information is not intended to replace advice given to you by your health care provider. Make sure you discuss any questions you have  with your health care provider. Document Released: 01/02/2013 Document Revised: 08/17/2015 Document Reviewed: 07/04/2015 Elsevier Interactive Patient Education  2018 [place].   Bone Marrow Aspiration and Bone Marrow Biopsy, Adult, Care After This sheet gives you information about how to care for yourself after your procedure. Your health care provider may also give you more specific instructions. If you have problems or questions, contact your health care provider. What can I expect after the procedure? After the procedure, it is common to have:  Mild pain and tenderness.  Swelling.  Bruising.  Follow these instructions at home:  Take over-the-counter or prescription medicines only as told by your health care provider.  Do not take baths, swim, or use a hot tub until your health care provider approves. Ask if you can take a shower or have a sponge bath.  You may shower tomorrow.  Follow instructions from your health care provider about how to take care of the puncture site. Make sure you: ? Wash your hands with soap and water before you change your bandage (dressing). If soap and water are not available, use hand sanitizer. ? Change your dressing as told by your health care provider.  You may remove your dressing tomorrow.  Check your puncture siteevery day for signs of infection. Check for: ? More redness, swelling, or pain. ? More fluid or blood. ? Warmth. ? Pus or a bad smell.  Return to your normal activities as told by your health care provider. Ask your health care provider what activities are safe for you.  Do not drive  for 24 hours if you were given a medicine to help you relax (sedative).  Keep all follow-up visits as told by your health care provider. This is important. Contact a health care provider if:  You have more redness, swelling, or pain around the puncture site.  You have more fluid or blood coming from the puncture site.  Your puncture site feels  warm to the touch.  You have pus or a bad smell coming from the puncture site.  You have a fever.  Your pain is not controlled with medicine. This information is not intended to replace advice given to you by your health care provider. Make sure you discuss any questions you have with your health care provider. Document Released: 10/01/2004 Document Revised: 10/02/2015 Document Reviewed: 08/26/2015 Elsevier Interactive Patient Education  2018 [place] Insertion, Care After This sheet gives you information about how to care for yourself after your procedure. Your health care provider may also give you more specific instructions. If you have problems or questions, contact your health care provider. What can I expect after the procedure? After your procedure, it is common to have:  Discomfort at the port insertion site.  Bruising on the skin over the port. This should improve over 3-4 days.  Follow these instructions at home: [facility] care  After your port is placed, you will get a manufacturer's information card. The card has information about your port. Keep this card with you at all times.  Take care of the port as told by your health care provider. Ask your health care provider if you or a family member can get training for taking care of the port at home. A home health care nurse may also take care of the port.  Make sure to remember what type of port you have. Incision care  Follow instructions from your health care provider about how to take care of your port insertion site. Make sure you: ? Wash your hands with soap and water before you change your bandage (dressing). If soap and water are not available, use hand sanitizer. ? Change your dressing as told by your health care provider.  You may remove your dressing tomorrow. ? Leave skin glue in place. These skin closures may need to stay in place for 2 weeks or longer. If adhesive strip edges start to loosen  and curl up, you may trim the loose edges. Do not remove adhesive strips completely unless your health care provider tells you to do that.  DO NOT use EMLA cream for 2 weeks after port placement as this cream will remove surgical glue on your incision.  Check your port insertion site every day for signs of infection. Check for: ? More redness, swelling, or pain. ? More fluid or blood. ? Warmth. ? Pus or a bad smell. General instructions  Do not take baths, swim, or use a hot tub until your health care provider approves.  You may shower tomorrow.  Do not lift anything that is heavier than 10 lb (4.5 kg) for a week, or as told by your health care provider.  Ask your health care provider when it is okay to: ? Return to work or school. ? Resume usual physical activities or sports.  Do not drive for 24 hours if you were given a medicine to help you relax (sedative).  Take over-the-counter and prescription medicines only as told by your health care provider.  Wear a medical alert bracelet in case of  an emergency. This will tell any health care providers that you have a port.  Keep all follow-up visits as told by your health care provider. This is important. Contact a health care provider if:  You have a fever or chills.  You have more redness, swelling, or pain around your port insertion site.  You have more fluid or blood coming from your port insertion site.  Your port insertion site feels warm to the touch.  You have pus or a bad smell coming from the port insertion site. Get help right away if:  You have chest pain or shortness of breath.  You have bleeding from your port that you cannot control. Summary  Take care of the port as told by your health care provider.  Change your dressing as told by your health care provider.  Keep all follow-up visits as told by your health care provider. This information is not intended to replace advice given to you by your health care  provider. Make sure you discuss any questions you have with your health care provider. Document Released: 01/02/2013 Document Revised: 02/03/2016 Document Reviewed: 02/03/2016 Elsevier Interactive Patient Education  2017 [employer].
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
## 2017-09-13 NOTE — Sedation Documentation (Signed)
Port a cath placement procedure started.

## 2017-09-13 NOTE — Procedures (Signed)
Interventional Radiology Procedure Note  Procedure: Placement of a right IJ approach single lumen PowerPort.  Tip is positioned at the superior cavoatrial junction and catheter is ready for immediate use.  Complications: None Recommendations:  - Ok to shower tomorrow - Do not submerge for 7 days - Routine line care   Signed,  Aarav Burgett S. Alan Drummer, DO   

## 2017-09-13 NOTE — Sedation Documentation (Signed)
Bone Marrow Biopsy complete.

## 2017-09-14 LAB — FUNGUS CULTURE WITH STAIN

## 2017-09-14 LAB — FUNGUS CULTURE RESULT

## 2017-09-14 LAB — FUNGAL ORGANISM REFLEX

## 2017-09-15 ENCOUNTER — Telehealth: Payer: Self-pay | Admitting: Hematology

## 2017-09-15 ENCOUNTER — Inpatient Hospital Stay: Payer: Medicare Other

## 2017-09-15 ENCOUNTER — Other Ambulatory Visit: Payer: Self-pay | Admitting: Hematology

## 2017-09-15 NOTE — Telephone Encounter (Signed)
Appointments scheduled AVS/Calendar to be printed today from [place] per 6/18 los

## 2017-09-18 ENCOUNTER — Telehealth: Payer: Self-pay | Admitting: *Deleted

## 2017-09-18 ENCOUNTER — Telehealth: Payer: Self-pay

## 2017-09-18 ENCOUNTER — Encounter: Payer: Self-pay | Admitting: Hematology

## 2017-09-18 ENCOUNTER — Other Ambulatory Visit: Payer: Self-pay | Admitting: Hematology

## 2017-09-18 ENCOUNTER — Ambulatory Visit (HOSPITAL_COMMUNITY)
Admission: RE | Admit: 2017-09-18 | Discharge: 2017-09-18 | Disposition: A | Discharge status: Home / Self Care | Payer: Medicare Other | Source: Ambulatory Visit | Attending: Hematology | Admitting: Hematology

## 2017-09-18 ENCOUNTER — Encounter

## 2017-09-18 DIAGNOSIS — C8442 Peripheral T-cell lymphoma, not classified, intrathoracic lymph nodes: Secondary | ICD-10-CM | POA: Diagnosis not present

## 2017-09-18 DIAGNOSIS — Z7189 Other specified counseling: Secondary | ICD-10-CM | POA: Insufficient documentation

## 2017-09-18 DIAGNOSIS — R079 Chest pain, unspecified: Secondary | ICD-10-CM | POA: Insufficient documentation

## 2017-09-18 MED ORDER — PROCHLORPERAZINE MALEATE 10 MG PO TABS: 10.0000 mg | ORAL_TABLET | Freq: Four times a day (QID) | ORAL | 6 refills | Status: DC | PRN

## 2017-09-18 MED ORDER — ALLOPURINOL 300 MG PO TABS: 300.0000 mg | ORAL_TABLET | Freq: Every day | ORAL | 3 refills | Status: DC

## 2017-09-18 MED ORDER — PREDNISONE 20 MG PO TABS: 60.0000 mg | ORAL_TABLET | Freq: Every day | ORAL | 6 refills | Status: AC

## 2017-09-18 MED ORDER — ONDANSETRON HCL 8 MG PO TABS: 8.0000 mg | ORAL_TABLET | Freq: Two times a day (BID) | ORAL | 1 refills | Status: AC | PRN

## 2017-09-18 NOTE — Telephone Encounter (Signed)
Patient is aware he will need an appointment. Patient is aware an appointment is needed.  Patient states he is in [place] and since he ahs started treatment he has increased reflux.  Patient was advised he will need to as Oncology provider in [place] if he can get something for reflux or make an appointment with Dr. Madelyn Brunner when he returns.  Patient states he thinks it would be best if Dr. Madelyn Brunner called his oncology provider and let them know. Patient was advised that he needs to call himself and ask let the provider know he needs something for reflux.  Patient verbalizes understanding.

## 2017-09-18 NOTE — Telephone Encounter (Signed)
Please advise if an appointment is needed.

## 2017-09-18 NOTE — Telephone Encounter (Signed)
Yes if he is taking 2 tabs will need OV

## 2017-09-18 NOTE — Telephone Encounter (Signed)
Pt called stating he has been taking 2 of the prilosec because it seems like it is getting worse. Please advise [phone]

## 2017-09-18 NOTE — Progress Notes (Signed)
  Echocardiogram 2D Echocardiogram has been performed.  Juan Mcintosh L Androw 09/18/2017, 11:25 AM

## 2017-09-18 NOTE — Telephone Encounter (Signed)
Patient called and left a message asking for a return phone call. Returned phone call to patient. No answer. Left a voicemail to return call to [phone] and ask for Dr. Grier Mitts nurse.

## 2017-09-18 NOTE — Progress Notes (Signed)
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

## 2017-09-19 ENCOUNTER — Telehealth: Payer: Self-pay | Admitting: *Deleted

## 2017-09-19 ENCOUNTER — Other Ambulatory Visit: Payer: Self-pay | Admitting: *Deleted

## 2017-09-19 ENCOUNTER — Other Ambulatory Visit: Payer: Self-pay

## 2017-09-19 MED ORDER — LIDOCAINE-PRILOCAINE 2.5-2.5 % EX CREA: 1.0000 "application " | TOPICAL_CREAM | CUTANEOUS | 0 refills | Status: AC | PRN

## 2017-09-19 NOTE — Progress Notes (Unsigned)
emla

## 2017-09-21 ENCOUNTER — Encounter (HOSPITAL_COMMUNITY): Payer: Self-pay | Admitting: Hematology
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
## 2017-09-24 LAB — ACID FAST CULTURE WITH REFLEXED SENSITIVITIES

## 2017-09-24 LAB — ACID FAST CULTURE WITH REFLEXED SENSITIVITIES (MYCOBACTERIA): Acid Fast Culture: NEGATIVE

## 2017-09-25 ENCOUNTER — Inpatient Hospital Stay: Payer: Medicare Other | Attending: Hematology

## 2017-09-25 ENCOUNTER — Telehealth: Payer: Self-pay

## 2017-09-25 ENCOUNTER — Inpatient Hospital Stay: Payer: Medicare Other

## 2017-09-25 ENCOUNTER — Inpatient Hospital Stay (HOSPITAL_BASED_OUTPATIENT_CLINIC_OR_DEPARTMENT_OTHER): Payer: Medicare Other | Admitting: Hematology

## 2017-09-25 VITALS — BP 129/93 | HR 82 | Temp 98.8°F | Resp 16

## 2017-09-25 DIAGNOSIS — R918 Other nonspecific abnormal finding of lung field: Secondary | ICD-10-CM | POA: Insufficient documentation

## 2017-09-25 DIAGNOSIS — Z5189 Encounter for other specified aftercare: Secondary | ICD-10-CM | POA: Diagnosis not present

## 2017-09-25 DIAGNOSIS — Z5111 Encounter for antineoplastic chemotherapy: Secondary | ICD-10-CM | POA: Diagnosis not present

## 2017-09-25 DIAGNOSIS — I10 Essential (primary) hypertension: Secondary | ICD-10-CM | POA: Diagnosis not present

## 2017-09-25 DIAGNOSIS — Z87828 Personal history of other (healed) physical injury and trauma: Secondary | ICD-10-CM | POA: Insufficient documentation

## 2017-09-25 DIAGNOSIS — C8442 Peripheral T-cell lymphoma, not classified, intrathoracic lymph nodes: Secondary | ICD-10-CM

## 2017-09-25 DIAGNOSIS — G8929 Other chronic pain: Secondary | ICD-10-CM | POA: Insufficient documentation

## 2017-09-25 DIAGNOSIS — Z933 Colostomy status: Secondary | ICD-10-CM | POA: Insufficient documentation

## 2017-09-25 DIAGNOSIS — Z905 Acquired absence of kidney: Secondary | ICD-10-CM | POA: Diagnosis not present

## 2017-09-25 DIAGNOSIS — F17211 Nicotine dependence, cigarettes, in remission: Secondary | ICD-10-CM

## 2017-09-25 DIAGNOSIS — Z5112 Encounter for antineoplastic immunotherapy: Secondary | ICD-10-CM | POA: Insufficient documentation

## 2017-09-25 DIAGNOSIS — M5489 Other dorsalgia: Secondary | ICD-10-CM | POA: Diagnosis not present

## 2017-09-25 DIAGNOSIS — Z95828 Presence of other vascular implants and grafts: Secondary | ICD-10-CM | POA: Insufficient documentation

## 2017-09-25 DIAGNOSIS — Z7189 Other specified counseling: Secondary | ICD-10-CM

## 2017-09-25 LAB — CMP (CANCER CENTER ONLY)
ALBUMIN: 3.7 g/dL (ref 3.5–5.0)
ALT: 22 U/L (ref 0–44)
AST: 17 U/L (ref 15–41)
Alkaline Phosphatase: 88 U/L (ref 38–126)
Anion gap: 6 (ref 5–15)
BUN: 11 mg/dL (ref 6–20)
CHLORIDE: 109 mmol/L (ref 98–111)
CO2: 24 mmol/L (ref 22–32)
CREATININE: 1.24 mg/dL (ref 0.61–1.24)
Calcium: 9.7 mg/dL (ref 8.9–10.3)
GFR, Est AFR Am: >60 mL/min (ref >60)
GFR, Estimated: >60 mL/min (ref >60)
GLUCOSE: 109 mg/dL — AB (ref 70–99)
Potassium: 4.2 mmol/L (ref 3.5–5.1)
SODIUM: 139 mmol/L (ref 135–145)
Total Bilirubin: <0.2 mg/dL — ABNORMAL LOW (ref 0.3–1.2)
Total Protein: 7.2 g/dL (ref 6.5–8.1)

## 2017-09-25 LAB — CBC WITH DIFFERENTIAL/PLATELET
Basophils Absolute: 0.1 K/uL (ref 0.0–0.1)
Basophils Relative: 1 %
Eosinophils Absolute: 0.1 K/uL (ref 0.0–0.5)
Eosinophils Relative: 1 %
HCT: 38.1 % — ABNORMAL LOW (ref 38.4–49.9)
Hemoglobin: 12.4 g/dL — ABNORMAL LOW (ref 13.0–17.1)
Lymphocytes Relative: 17 %
Lymphs Abs: 1.1 K/uL (ref 0.9–3.3)
MCH: 29.4 pg (ref 27.2–33.4)
MCHC: 32.6 g/dL (ref 32.0–36.0)
MCV: 90.3 fL (ref 79.3–98.0)
Monocytes Absolute: 0.4 K/uL (ref 0.1–0.9)
Monocytes Relative: 6 %
Neutro Abs: 5 K/uL (ref 1.5–6.5)
Neutrophils Relative %: 75 %
Platelets: 320 K/uL (ref 140–400)
RBC: 4.22 MIL/uL (ref 4.20–5.82)
RDW: 17.2 % — ABNORMAL HIGH (ref 11.0–14.6)
WBC: 6.6 K/uL (ref 4.0–10.3)

## 2017-09-25 LAB — LACTATE DEHYDROGENASE: LDH: 160 U/L (ref 98–192)

## 2017-09-25 MED ORDER — SODIUM CHLORIDE 0.9 % IV SOLN
1.8000 mg/kg | Freq: Once | INTRAVENOUS | Status: AC
  Administered 2017-09-25: 130 mg via INTRAVENOUS
  Filled 2017-09-25: qty 26

## 2017-09-25 MED ORDER — SODIUM CHLORIDE 0.9% FLUSH
10.0000 mL | INTRAVENOUS | Status: DC | PRN
  Administered 2017-09-25: 10 mL
  Filled 2017-09-25: qty 10

## 2017-09-25 MED ORDER — SODIUM CHLORIDE 0.9 % IV SOLN
750.0000 mg/m2 | Freq: Once | INTRAVENOUS | Status: AC
  Administered 2017-09-25: 1380 mg via INTRAVENOUS
  Filled 2017-09-25: qty 69

## 2017-09-25 MED ORDER — HEPARIN SOD (PORK) LOCK FLUSH 100 UNIT/ML IV SOLN
500.0000 [IU] | Freq: Once | INTRAVENOUS | Status: AC | PRN
  Administered 2017-09-25: 500 [IU]
  Filled 2017-09-25: qty 5

## 2017-09-25 MED ORDER — DEXAMETHASONE SODIUM PHOSPHATE 10 MG/ML IJ SOLN
INTRAMUSCULAR | Status: AC
  Filled 2017-09-25: qty 1

## 2017-09-25 MED ORDER — SODIUM CHLORIDE 0.9 % IV SOLN
Freq: Once | INTRAVENOUS | Status: AC
  Administered 2017-09-25: 09:00:00 via INTRAVENOUS

## 2017-09-25 MED ORDER — DEXAMETHASONE SODIUM PHOSPHATE 10 MG/ML IJ SOLN
10.0000 mg | Freq: Once | INTRAMUSCULAR | Status: AC
  Administered 2017-09-25: 10 mg via INTRAVENOUS

## 2017-09-25 MED ORDER — DOXORUBICIN HCL CHEMO IV INJECTION 2 MG/ML
50.0000 mg/m2 | Freq: Once | INTRAVENOUS | Status: AC
  Administered 2017-09-25: 92 mg via INTRAVENOUS
  Filled 2017-09-25: qty 46

## 2017-09-25 MED ORDER — PALONOSETRON HCL INJECTION 0.25 MG/5ML
0.2500 mg | Freq: Once | INTRAVENOUS | Status: AC
  Administered 2017-09-25: 0.25 mg via INTRAVENOUS

## 2017-09-25 MED ORDER — PALONOSETRON HCL INJECTION 0.25 MG/5ML
INTRAVENOUS | Status: AC
  Filled 2017-09-25: qty 5

## 2017-09-25 NOTE — Telephone Encounter (Signed)
Printed avs and calender of upcoming appointment. Per 7/1 los

## 2017-09-25 NOTE — Patient Instructions (Addendum)
[place] Discharge Instructions for Patients Receiving Chemotherapy  Today you received the following chemotherapy agents Adriamycin, Cytoxan, and Brentuximab.  To help prevent nausea and vomiting after your treatment, we encourage you to take your nausea medication as prescribed.   If you develop nausea and vomiting that is not controlled by your nausea medication, call the clinic.   BELOW ARE SYMPTOMS THAT SHOULD BE REPORTED IMMEDIATELY:  *FEVER GREATER THAN 100.5 F  *CHILLS WITH OR WITHOUT FEVER  NAUSEA AND VOMITING THAT IS NOT CONTROLLED WITH YOUR NAUSEA MEDICATION  *UNUSUAL SHORTNESS OF BREATH  *UNUSUAL BRUISING OR BLEEDING  TENDERNESS IN MOUTH AND THROAT WITH OR WITHOUT PRESENCE OF ULCERS  *URINARY PROBLEMS  *BOWEL PROBLEMS  UNUSUAL RASH Items with * indicate a potential emergency and should be followed up as soon as possible.  Feel free to call the clinic should you have any questions or concerns. The clinic phone number is [phone].  Please show the [place] at check-in to the Emergency Department and triage nurse.  Doxorubicin injection (Adriamycin) What is this medicine? DOXORUBICIN (dox oh ROO bi sin) is a chemotherapy drug. It is used to treat many kinds of cancer like leukemia, lymphoma, neuroblastoma, sarcoma, and Wilms' tumor. It is also used to treat bladder cancer, breast cancer, lung cancer, ovarian cancer, stomach cancer, and thyroid cancer. This medicine may be used for other purposes; ask your health care provider or pharmacist if you have questions. COMMON BRAND NAME(S): Adriamycin, Adriamycin PFS, Adriamycin RDF, Rubex What should I tell my health care provider before I take this medicine? They need to know if you have any of these conditions: -heart disease -history of low blood counts caused by a medicine -liver disease -recent or ongoing radiation therapy -an unusual or allergic reaction to doxorubicin, other  chemotherapy agents, other medicines, foods, dyes, or preservatives -pregnant or trying to get pregnant -breast-feeding How should I use this medicine? This drug is given as an infusion into a vein. It is administered in a hospital or clinic by a specially trained health care professional. If you have pain, swelling, burning or any unusual feeling around the site of your injection, tell your health care professional right away. Talk to your pediatrician regarding the use of this medicine in children. Special care may be needed. Overdosage: If you think you have taken too much of this medicine contact a poison control center or emergency room at once. NOTE: This medicine is only for you. Do not share this medicine with others. What if I miss a dose? It is important not to miss your dose. Call your doctor or health care professional if you are unable to keep an appointment. What may interact with this medicine? This medicine may interact with the following medications: -6-mercaptopurine -paclitaxel -phenytoin -St. John's Wort -trastuzumab -verapamil This list may not describe all possible interactions. Give your health care provider a list of all the medicines, herbs, non-prescription drugs, or dietary supplements you use. Also tell them if you smoke, drink alcohol, or use illegal drugs. Some items may interact with your medicine. What should I watch for while using this medicine? This drug may make you feel generally unwell. This is not uncommon, as chemotherapy can affect healthy cells as well as cancer cells. Report any side effects. Continue your course of treatment even though you feel ill unless your doctor tells you to stop. There is a maximum amount of this medicine you should receive throughout your life. The amount  depends on the medical condition being treated and your overall health. Your doctor will watch how much of this medicine you receive in your lifetime. Tell your doctor if you  have taken this medicine before. You may need blood work done while you are taking this medicine. Your urine may turn red for a few days after your dose. This is not blood. If your urine is dark or brown, call your doctor. In some cases, you may be given additional medicines to help with side effects. Follow all directions for their use. Call your doctor or health care professional for advice if you get a fever, chills or sore throat, or other symptoms of a cold or flu. Do not treat yourself. This drug decreases your body's ability to fight infections. Try to avoid being around people who are sick. This medicine may increase your risk to bruise or bleed. Call your doctor or health care professional if you notice any unusual bleeding. Talk to your doctor about your risk of cancer. You may be more at risk for certain types of cancers if you take this medicine. Do not become pregnant while taking this medicine or for 6 months after stopping it. Women should inform their doctor if they wish to become pregnant or think they might be pregnant. Men should not father a child while taking this medicine and for 6 months after stopping it. There is a potential for serious side effects to an unborn child. Talk to your health care professional or pharmacist for more information. Do not breast-feed an infant while taking this medicine. This medicine has caused ovarian failure in some women and reduced sperm counts in some men This medicine may interfere with the ability to have a child. Talk with your doctor or health care professional if you are concerned about your fertility. What side effects may I notice from receiving this medicine? Side effects that you should report to your doctor or health care professional as soon as possible: -allergic reactions like skin rash, itching or hives, swelling of the face, lips, or tongue -breathing problems -chest pain -fast or irregular heartbeat -low blood counts - this  medicine may decrease the number of white blood cells, red blood cells and platelets. You may be at increased risk for infections and bleeding. -pain, redness, or irritation at site where injected -signs of infection - fever or chills, cough, sore throat, pain or difficulty passing urine -signs of decreased platelets or bleeding - bruising, pinpoint red spots on the skin, black, tarry stools, blood in the urine -swelling of the ankles, feet, hands -tiredness -weakness Side effects that usually do not require medical attention (report to your doctor or health care professional if they continue or are bothersome): -diarrhea -hair loss -mouth sores -nail discoloration or damage -nausea -red colored urine -vomiting This list may not describe all possible side effects. Call your doctor for medical advice about side effects. You may report side effects to FDA at 1-800-FDA-1088. Where should I keep my medicine? This drug is given in a hospital or clinic and will not be stored at home. NOTE: This sheet is a summary. It may not cover all possible information. If you have questions about this medicine, talk to your doctor, pharmacist, or health care provider.  2018 Elsevier/Gold Standard (2015-05-11 11:28:51)   Cyclophosphamide injection (Cytoxan) What is this medicine? CYCLOPHOSPHAMIDE (sye kloe FOSS fa mide) is a chemotherapy drug. It slows the growth of cancer cells. This medicine is used to treat many types  of cancer like lymphoma, myeloma, leukemia, breast cancer, and ovarian cancer, to name a few. This medicine may be used for other purposes; ask your health care provider or pharmacist if you have questions. COMMON BRAND NAME(S): Cytoxan, Neosar What should I tell my health care provider before I take this medicine? They need to know if you have any of these conditions: -blood disorders -history of other chemotherapy -infection -kidney disease -liver disease -recent or ongoing  radiation therapy -tumors in the bone marrow -an unusual or allergic reaction to cyclophosphamide, other chemotherapy, other medicines, foods, dyes, or preservatives -pregnant or trying to get pregnant -breast-feeding How should I use this medicine? This drug is usually given as an injection into a vein or muscle or by infusion into a vein. It is administered in a hospital or clinic by a specially trained health care professional. Talk to your pediatrician regarding the use of this medicine in children. Special care may be needed. Overdosage: If you think you have taken too much of this medicine contact a poison control center or emergency room at once. NOTE: This medicine is only for you. Do not share this medicine with others. What if I miss a dose? It is important not to miss your dose. Call your doctor or health care professional if you are unable to keep an appointment. What may interact with this medicine? This medicine may interact with the following medications: -amiodarone -amphotericin B -azathioprine -certain antiviral medicines for HIV or AIDS such as protease inhibitors (e.g., indinavir, ritonavir) and zidovudine -certain blood pressure medications such as benazepril, captopril, enalapril, fosinopril, lisinopril, moexipril, monopril, perindopril, quinapril, ramipril, trandolapril -certain cancer medications such as anthracyclines (e.g., daunorubicin, doxorubicin), busulfan, cytarabine, paclitaxel, pentostatin, tamoxifen, trastuzumab -certain diuretics such as chlorothiazide, chlorthalidone, hydrochlorothiazide, indapamide, metolazone -certain medicines that treat or prevent blood clots like warfarin -certain muscle relaxants such as succinylcholine -cyclosporine -etanercept -indomethacin -medicines to increase blood counts like filgrastim, pegfilgrastim, sargramostim -medicines used as general anesthesia -metronidazole -natalizumab This list may not describe all possible  interactions. Give your health care provider a list of all the medicines, herbs, non-prescription drugs, or dietary supplements you use. Also tell them if you smoke, drink alcohol, or use illegal drugs. Some items may interact with your medicine. What should I watch for while using this medicine? Visit your doctor for checks on your progress. This drug may make you feel generally unwell. This is not uncommon, as chemotherapy can affect healthy cells as well as cancer cells. Report any side effects. Continue your course of treatment even though you feel ill unless your doctor tells you to stop. Drink water or other fluids as directed. Urinate often, even at night. In some cases, you may be given additional medicines to help with side effects. Follow all directions for their use. Call your doctor or health care professional for advice if you get a fever, chills or sore throat, or other symptoms of a cold or flu. Do not treat yourself. This drug decreases your body's ability to fight infections. Try to avoid being around people who are sick. This medicine may increase your risk to bruise or bleed. Call your doctor or health care professional if you notice any unusual bleeding. Be careful brushing and flossing your teeth or using a toothpick because you may get an infection or bleed more easily. If you have any dental work done, tell your dentist you are receiving this medicine. You may get drowsy or dizzy. Do not drive, use machinery, or do anything  that needs mental alertness until you know how this medicine affects you. Do not become pregnant while taking this medicine or for 1 year after stopping it. Women should inform their doctor if they wish to become pregnant or think they might be pregnant. Men should not father a child while taking this medicine and for 4 months after stopping it. There is a potential for serious side effects to an unborn child. Talk to your health care professional or pharmacist for  more information. Do not breast-feed an infant while taking this medicine. This medicine may interfere with the ability to have a child. This medicine has caused ovarian failure in some women. This medicine has caused reduced sperm counts in some men. You should talk with your doctor or health care professional if you are concerned about your fertility. If you are going to have surgery, tell your doctor or health care professional that you have taken this medicine. What side effects may I notice from receiving this medicine? Side effects that you should report to your doctor or health care professional as soon as possible: -allergic reactions like skin rash, itching or hives, swelling of the face, lips, or tongue -low blood counts - this medicine may decrease the number of white blood cells, red blood cells and platelets. You may be at increased risk for infections and bleeding. -signs of infection - fever or chills, cough, sore throat, pain or difficulty passing urine -signs of decreased platelets or bleeding - bruising, pinpoint red spots on the skin, black, tarry stools, blood in the urine -signs of decreased red blood cells - unusually weak or tired, fainting spells, lightheadedness -breathing problems -dark urine -dizziness -palpitations -swelling of the ankles, feet, hands -trouble passing urine or change in the amount of urine -weight gain -yellowing of the eyes or skin Side effects that usually do not require medical attention (report to your doctor or health care professional if they continue or are bothersome): -changes in nail or skin color -hair loss -missed menstrual periods -mouth sores -nausea, vomiting This list may not describe all possible side effects. Call your doctor for medical advice about side effects. You may report side effects to FDA at 1-800-FDA-1088. Where should I keep my medicine? This drug is given in a hospital or clinic and will not be stored at  home. NOTE: This sheet is a summary. It may not cover all possible information. If you have questions about this medicine, talk to your doctor, pharmacist, or health care provider.  2018 Elsevier/Gold Standard (2012-01-27 16:22:58)  Brentuximab vedotin solution for injection What is this medicine? BRENTUXIMAB VEDOTIN (bren TUX see mab ve DOE tin) is a monoclonal antibody and a chemotherapy drug. It is used for treating Hodgkin lymphoma and certain non-Hodgkin lymphomas, such as anaplastic large-cell lymphoma and mycosis fungoides. This medicine may be used for other purposes; ask your health care provider or pharmacist if you have questions. COMMON BRAND NAME(S): ADCETRIS What should I tell my health care provider before I take this medicine? They need to know if you have any of these conditions: -immune system problems -infection (especially a virus infection such as chickenpox, cold sores, or herpes) -kidney disease -liver disease -low blood counts, like low white cell, platelet, or red cell counts -tingling of the fingers or toes, or other nerve disorder -an unusual or allergic reaction to brentuximab vedotin, other medicines, foods, dyes, or preservatives -pregnant or trying to get pregnant -breast-feeding How should I use this medicine? This medicine is for  infusion into a vein. It is given by a health care professional in a hospital or clinic setting. Talk to your pediatrician regarding the use of this medicine in children. Special care may be needed. Overdosage: If you think you have taken too much of this medicine contact a poison control center or emergency room at once. NOTE: This medicine is only for you. Do not share this medicine with others. What if I miss a dose? It is important not to miss your dose. Call your doctor or health care professional if you are unable to keep an appointment. What may interact with this medicine? This medicine may interact with the following  medications: -ketoconazole -rifampin -St. John's wort; Hypericum perforatum This list may not describe all possible interactions. Give your health care provider a list of all the medicines, herbs, non-prescription drugs, or dietary supplements you use. Also tell them if you smoke, drink alcohol, or use illegal drugs. Some items may interact with your medicine. What should I watch for while using this medicine? Visit your doctor for checks on your progress. This drug may make you feel generally unwell. Report any side effects. Continue your course of treatment even though you feel ill unless your doctor tells you to stop. Call your doctor or health care professional for advice if you get a fever, chills or sore throat, or other symptoms of a cold or flu. Do not treat yourself. This drug decreases your body's ability to fight infections. Try to avoid being around people who are sick. This medicine may increase your risk to bruise or bleed. Call your doctor or health care professional if you notice any unusual bleeding. In some patients, this medicine may cause a serious brain infection that may cause death. If you have any problems seeing, thinking, speaking, walking, or standing, tell your doctor right away. If you cannot reach your doctor, urgently seek other source of medical care. Do not become pregnant while taking this medicine or for 6 months after stopping it. Women should inform their doctor if they wish to become pregnant or think they might be pregnant. Men should not father a child while taking this medicine and for 6 months after stopping it. There is a potential for serious side effects to an unborn child. Talk to your health care professional or pharmacist for more information. Do not breast-feed an infant while taking this medicine. This may interfere with the ability to father a child. You should talk to your doctor or health care professional if you are concerned about your  fertility. What side effects may I notice from receiving this medicine? Side effects that you should report to your doctor or health care professional as soon as possible: -allergic reactions like skin rash, itching or hives, swelling of the face, lips, or tongue -changes in emotions or moods -diarrhea -low blood counts - this medicine may decrease the number of white blood cells, red blood cells and platelets. You may be at increased risk for infections and bleeding. -pain, tingling, numbness in the hands or feet -redness, blistering, peeling or loosening of the skin, including inside the mouth -shortness of breath -signs of infection - fever or chills, cough, sore throat, pain or difficulty passing urine -signs of decreased platelets or bleeding - bruising, pinpoint red spots on the skin, black, tarry stools, blood in the urine -signs of decreased red blood cells - unusually weak or tired, fainting spells, lightheadedness -signs of liver injury like dark yellow or brown urine; general ill  feeling or flu-like symptoms; light-colored stools; loss of appetite; nausea; right upper belly pain; yellowing of the eyes or skin -stomach pain -sudden numbness or weakness of the face, arm or leg -vomiting Side effects that usually do not require medical attention (report to your doctor or health care professional if they continue or are bothersome): -dizziness -headache -muscle pain -tiredness This list may not describe all possible side effects. Call your doctor for medical advice about side effects. You may report side effects to FDA at 1-800-FDA-1088. Where should I keep my medicine? This drug is given in a hospital or clinic and will not be stored at home. NOTE: This sheet is a summary. It may not cover all possible information. If you have questions about this medicine, talk to your doctor, pharmacist, or health care provider.  2018 Elsevier/Gold Standard (2016-02-05 18:06:58)

## 2017-09-27 ENCOUNTER — Other Ambulatory Visit: Payer: Self-pay | Admitting: Hematology

## 2017-09-27 ENCOUNTER — Ambulatory Visit: Payer: Medicare Other

## 2017-09-27 ENCOUNTER — Inpatient Hospital Stay (HOSPITAL_BASED_OUTPATIENT_CLINIC_OR_DEPARTMENT_OTHER): Payer: Medicare Other

## 2017-09-27 VITALS — BP 140/88 | HR 90 | Temp 98.4°F | Resp 20

## 2017-09-27 DIAGNOSIS — C8442 Peripheral T-cell lymphoma, not classified, intrathoracic lymph nodes: Secondary | ICD-10-CM

## 2017-09-27 DIAGNOSIS — Z5111 Encounter for antineoplastic chemotherapy: Secondary | ICD-10-CM | POA: Diagnosis not present

## 2017-09-27 DIAGNOSIS — Z7189 Other specified counseling: Secondary | ICD-10-CM

## 2017-09-27 LAB — ACID FAST CULTURE WITH REFLEXED SENSITIVITIES: ACID FAST CULTURE - AFSCU3: NEGATIVE

## 2017-09-27 MED ORDER — PEGFILGRASTIM-CBQV 6 MG/0.6ML ~~LOC~~ SOSY
PREFILLED_SYRINGE | SUBCUTANEOUS | Status: AC
  Filled 2017-09-27: qty 0.6

## 2017-09-27 MED ORDER — PEGFILGRASTIM-CBQV 6 MG/0.6ML ~~LOC~~ SOSY
6.0000 mg | PREFILLED_SYRINGE | Freq: Once | SUBCUTANEOUS | Status: AC
  Administered 2017-09-27: 6 mg via SUBCUTANEOUS

## 2017-09-27 NOTE — Patient Instructions (Signed)
Pegfilgrastim injection What is this medicine? PEGFILGRASTIM (PEG fil gra stim) is a long-acting granulocyte colony-stimulating factor that stimulates the growth of neutrophils, a type of white blood cell important in the body's fight against infection. It is used to reduce the incidence of fever and infection in patients with certain types of cancer who are receiving chemotherapy that affects the bone marrow, and to increase survival after being exposed to high doses of radiation. This medicine may be used for other purposes; ask your health care provider or pharmacist if you have questions. COMMON BRAND NAME(S): Neulasta What should I tell my health care provider before I take this medicine? They need to know if you have any of these conditions: -kidney disease -latex allergy -ongoing radiation therapy -sickle cell disease -skin reactions to acrylic adhesives (On-Body Injector only) -an unusual or allergic reaction to pegfilgrastim, filgrastim, other medicines, foods, dyes, or preservatives -pregnant or trying to get pregnant -breast-feeding How should I use this medicine? This medicine is for injection under the skin. If you get this medicine at home, you will be taught how to prepare and give the pre-filled syringe or how to use the On-body Injector. Refer to the patient Instructions for Use for detailed instructions. Use exactly as directed. Tell your healthcare provider immediately if you suspect that the On-body Injector may not have performed as intended or if you suspect the use of the On-body Injector resulted in a missed or partial dose. It is important that you put your used needles and syringes in a special sharps container. Do not put them in a trash can. If you do not have a sharps container, call your pharmacist or healthcare provider to get one. Talk to your pediatrician regarding the use of this medicine in children. While this drug may be prescribed for selected conditions,  precautions do apply. Overdosage: If you think you have taken too much of this medicine contact a poison control center or emergency room at once. NOTE: This medicine is only for you. Do not share this medicine with others. What if I miss a dose? It is important not to miss your dose. Call your doctor or health care professional if you miss your dose. If you miss a dose due to an On-body Injector failure or leakage, a new dose should be administered as soon as possible using a single prefilled syringe for manual use. What may interact with this medicine? Interactions have not been studied. Give your health care provider a list of all the medicines, herbs, non-prescription drugs, or dietary supplements you use. Also tell them if you smoke, drink alcohol, or use illegal drugs. Some items may interact with your medicine. This list may not describe all possible interactions. Give your health care provider a list of all the medicines, herbs, non-prescription drugs, or dietary supplements you use. Also tell them if you smoke, drink alcohol, or use illegal drugs. Some items may interact with your medicine. What should I watch for while using this medicine? You may need blood work done while you are taking this medicine. If you are going to need a MRI, CT scan, or other procedure, tell your doctor that you are using this medicine (On-Body Injector only). What side effects may I notice from receiving this medicine? Side effects that you should report to your doctor or health care professional as soon as possible: -allergic reactions like skin rash, itching or hives, swelling of the face, lips, or tongue -dizziness -fever -pain, redness, or irritation at site   where injected -pinpoint red spots on the skin -red or dark-brown urine -shortness of breath or breathing problems -stomach or side pain, or pain at the shoulder -swelling -tiredness -trouble passing urine or change in the amount of urine Side  effects that usually do not require medical attention (report to your doctor or health care professional if they continue or are bothersome): -bone pain -muscle pain This list may not describe all possible side effects. Call your doctor for medical advice about side effects. You may report side effects to FDA at 1-800-FDA-1088. Where should I keep my medicine? Keep out of the reach of children. Store pre-filled syringes in a refrigerator between 2 and 8 degrees C (36 and 46 degrees F). Do not freeze. Keep in carton to protect from light. Throw away this medicine if it is left out of the refrigerator for more than 48 hours. Throw away any unused medicine after the expiration date. NOTE: This sheet is a summary. It may not cover all possible information. If you have questions about this medicine, talk to your doctor, pharmacist, or health care provider.  2018 [employer] (2016-03-10 12:58:03)  

## 2017-09-28 LAB — ACID FAST CULTURE WITH REFLEXED SENSITIVITIES (MYCOBACTERIA): Acid Fast Culture: NEGATIVE

## 2017-10-02 LAB — ACID FAST CULTURE WITH REFLEXED SENSITIVITIES (MYCOBACTERIA): Acid Fast Culture: NEGATIVE

## 2017-10-02 NOTE — Progress Notes (Signed)
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 the patients questions were answered with apparent satisfaction. The patient knows to call the clinic with any problems, questions or concerns.  The total time spent in the appt was 20 minutes and more than 50% was on counseling and direct patient cares.      Sullivan Lone MD MS AAHIVMS [facility] Hematology/Oncology Physician [facility]  (Office):       [phone] (Work cell):  [phone] (Fax):           [phone]  10/04/2017 9:54 AM  I, Baldwin Jamaica, am acting as a Education administrator for Dr Irene Limbo.   .I have reviewed the above documentation for accuracy and completeness, and I agree with the above. Brunetta Genera MD

## 2017-10-04 ENCOUNTER — Encounter: Payer: Self-pay | Admitting: Hematology

## 2017-10-04 ENCOUNTER — Inpatient Hospital Stay (HOSPITAL_BASED_OUTPATIENT_CLINIC_OR_DEPARTMENT_OTHER): Payer: Medicare Other | Admitting: Hematology

## 2017-10-04 ENCOUNTER — Inpatient Hospital Stay: Payer: Medicare Other

## 2017-10-04 VITALS — BP 126/80 | HR 100 | Temp 98.4°F | Resp 18 | Ht 66.0 in | Wt 162.2 lb

## 2017-10-04 DIAGNOSIS — Z933 Colostomy status: Secondary | ICD-10-CM

## 2017-10-04 DIAGNOSIS — G8929 Other chronic pain: Secondary | ICD-10-CM | POA: Diagnosis not present

## 2017-10-04 DIAGNOSIS — C8442 Peripheral T-cell lymphoma, not classified, intrathoracic lymph nodes: Secondary | ICD-10-CM | POA: Diagnosis not present

## 2017-10-04 DIAGNOSIS — Z87891 Personal history of nicotine dependence: Secondary | ICD-10-CM

## 2017-10-04 DIAGNOSIS — R918 Other nonspecific abnormal finding of lung field: Secondary | ICD-10-CM | POA: Diagnosis not present

## 2017-10-04 DIAGNOSIS — M5489 Other dorsalgia: Secondary | ICD-10-CM

## 2017-10-04 DIAGNOSIS — Z87828 Personal history of other (healed) physical injury and trauma: Secondary | ICD-10-CM

## 2017-10-04 DIAGNOSIS — I10 Essential (primary) hypertension: Secondary | ICD-10-CM

## 2017-10-04 DIAGNOSIS — Z5111 Encounter for antineoplastic chemotherapy: Secondary | ICD-10-CM | POA: Diagnosis not present

## 2017-10-04 DIAGNOSIS — Z7189 Other specified counseling: Secondary | ICD-10-CM

## 2017-10-04 DIAGNOSIS — Z95828 Presence of other vascular implants and grafts: Secondary | ICD-10-CM

## 2017-10-04 LAB — CMP (CANCER CENTER ONLY)
ALK PHOS: 122 U/L (ref 38–126)
ALT: 39 U/L (ref 0–44)
ANION GAP: 11 (ref 5–15)
AST: 19 U/L (ref 15–41)
Albumin: 3.7 g/dL (ref 3.5–5.0)
BUN: 6 mg/dL (ref 6–20)
CALCIUM: 9.4 mg/dL (ref 8.9–10.3)
CO2: 21 mmol/L — ABNORMAL LOW (ref 22–32)
Chloride: 106 mmol/L (ref 98–111)
Creatinine: 1.25 mg/dL — ABNORMAL HIGH (ref 0.61–1.24)
GFR, Est AFR Am: >60 mL/min (ref >60)
GFR, Estimated: >60 mL/min (ref >60)
Glucose, Bld: 152 mg/dL — ABNORMAL HIGH (ref 70–99)
POTASSIUM: 3.2 mmol/L — AB (ref 3.5–5.1)
Sodium: 138 mmol/L (ref 135–145)
TOTAL PROTEIN: 6.9 g/dL (ref 6.5–8.1)

## 2017-10-04 LAB — CBC WITH DIFFERENTIAL/PLATELET
BASOS PCT: 0 %
Basophils Absolute: 0 10*3/uL (ref 0.0–0.1)
Eosinophils Absolute: 0.1 10*3/uL (ref 0.0–0.5)
Eosinophils Relative: 1 %
HEMATOCRIT: 38.2 % — AB (ref 38.4–49.9)
HEMOGLOBIN: 12.4 g/dL — AB (ref 13.0–17.1)
LYMPHS ABS: 2.5 10*3/uL (ref 0.9–3.3)
LYMPHS PCT: 26 %
MCH: 29.2 pg (ref 27.2–33.4)
MCHC: 32.5 g/dL (ref 32.0–36.0)
MCV: 90.1 fL (ref 79.3–98.0)
MONO ABS: 1.2 10*3/uL — AB (ref 0.1–0.9)
Monocytes Relative: 13 %
NEUTROS ABS: 5.9 10*3/uL (ref 1.5–6.5)
Neutrophils Relative %: 60 %
Platelets: 220 10*3/uL (ref 140–400)
RBC: 4.24 MIL/uL (ref 4.20–5.82)
RDW: 14.9 % — AB (ref 11.0–14.6)
WBC: 9.7 10*3/uL (ref 4.0–10.3)

## 2017-10-04 LAB — URIC ACID: Uric Acid, Serum: 5.1 mg/dL (ref 3.7–8.6)

## 2017-10-04 LAB — LACTATE DEHYDROGENASE: LDH: 170 U/L (ref 98–192)

## 2017-10-04 LAB — PHOSPHORUS: PHOSPHORUS: 2.8 mg/dL (ref 2.5–4.6)

## 2017-10-05 ENCOUNTER — Telehealth: Payer: Self-pay

## 2017-10-05 NOTE — Telephone Encounter (Signed)
Left a detailed message concerning patient upcoming appointments. Per 7/11 los. (called and no one answered left a vm)

## 2017-10-16 ENCOUNTER — Other Ambulatory Visit: Payer: Medicare Other

## 2017-10-16 ENCOUNTER — Ambulatory Visit: Payer: Medicare Other

## 2017-10-17 ENCOUNTER — Other Ambulatory Visit: Payer: Medicare Other

## 2017-10-17 ENCOUNTER — Inpatient Hospital Stay (HOSPITAL_BASED_OUTPATIENT_CLINIC_OR_DEPARTMENT_OTHER): Payer: Medicare Other | Admitting: Hematology

## 2017-10-17 ENCOUNTER — Inpatient Hospital Stay: Payer: Medicare Other

## 2017-10-17 ENCOUNTER — Telehealth: Payer: Self-pay | Admitting: Hematology

## 2017-10-17 VITALS — BP 120/96 | HR 95 | Temp 97.7°F | Resp 18 | Ht 66.0 in | Wt 164.6 lb

## 2017-10-17 DIAGNOSIS — G8929 Other chronic pain: Secondary | ICD-10-CM

## 2017-10-17 DIAGNOSIS — M549 Dorsalgia, unspecified: Secondary | ICD-10-CM | POA: Diagnosis not present

## 2017-10-17 DIAGNOSIS — R918 Other nonspecific abnormal finding of lung field: Secondary | ICD-10-CM | POA: Diagnosis not present

## 2017-10-17 DIAGNOSIS — Z87891 Personal history of nicotine dependence: Secondary | ICD-10-CM

## 2017-10-17 DIAGNOSIS — C8442 Peripheral T-cell lymphoma, not classified, intrathoracic lymph nodes: Secondary | ICD-10-CM | POA: Diagnosis not present

## 2017-10-17 DIAGNOSIS — L02224 Furuncle of groin: Secondary | ICD-10-CM | POA: Diagnosis not present

## 2017-10-17 DIAGNOSIS — Z5111 Encounter for antineoplastic chemotherapy: Secondary | ICD-10-CM | POA: Diagnosis not present

## 2017-10-17 DIAGNOSIS — I10 Essential (primary) hypertension: Secondary | ICD-10-CM

## 2017-10-17 DIAGNOSIS — Z95828 Presence of other vascular implants and grafts: Secondary | ICD-10-CM

## 2017-10-17 DIAGNOSIS — Z7189 Other specified counseling: Secondary | ICD-10-CM

## 2017-10-17 DIAGNOSIS — L02429 Furuncle of limb, unspecified: Secondary | ICD-10-CM

## 2017-10-17 LAB — CBC WITH DIFFERENTIAL/PLATELET
BASOS ABS: 0 10*3/uL (ref 0.0–0.1)
BASOS PCT: 0 %
EOS ABS: 0.1 10*3/uL (ref 0.0–0.5)
EOS PCT: 2 %
HCT: 38 % — ABNORMAL LOW (ref 38.4–49.9)
HEMOGLOBIN: 12.5 g/dL — AB (ref 13.0–17.1)
LYMPHS ABS: 2.5 10*3/uL (ref 0.9–3.3)
Lymphocytes Relative: 33 %
MCH: 29.5 pg (ref 27.2–33.4)
MCHC: 32.9 g/dL (ref 32.0–36.0)
MCV: 89.6 fL (ref 79.3–98.0)
Monocytes Absolute: 1.2 10*3/uL — ABNORMAL HIGH (ref 0.1–0.9)
Monocytes Relative: 17 %
NEUTROS PCT: 48 %
Neutro Abs: 3.6 10*3/uL (ref 1.5–6.5)
PLATELETS: 354 10*3/uL (ref 140–400)
RBC: 4.24 MIL/uL (ref 4.20–5.82)
RDW: 16.2 % — ABNORMAL HIGH (ref 11.0–14.6)
WBC: 7.4 10*3/uL (ref 4.0–10.3)

## 2017-10-17 LAB — CMP (CANCER CENTER ONLY)
ALBUMIN: 3.7 g/dL (ref 3.5–5.0)
ALT: 17 U/L (ref 0–44)
AST: 16 U/L (ref 15–41)
Alkaline Phosphatase: 93 U/L (ref 38–126)
Anion gap: 9 (ref 5–15)
BUN: 13 mg/dL (ref 6–20)
CO2: 22 mmol/L (ref 22–32)
CREATININE: 1.15 mg/dL (ref 0.61–1.24)
Calcium: 9.8 mg/dL (ref 8.9–10.3)
Chloride: 105 mmol/L (ref 98–111)
GFR, Est AFR Am: >60 mL/min (ref >60)
GFR, Estimated: >60 mL/min (ref >60)
GLUCOSE: 110 mg/dL — AB (ref 70–99)
POTASSIUM: 4.1 mmol/L (ref 3.5–5.1)
SODIUM: 136 mmol/L (ref 135–145)
Total Bilirubin: <0.2 mg/dL — ABNORMAL LOW (ref 0.3–1.2)
Total Protein: 7.5 g/dL (ref 6.5–8.1)

## 2017-10-17 LAB — LACTATE DEHYDROGENASE: LDH: 166 U/L (ref 98–192)

## 2017-10-17 MED ORDER — DOXORUBICIN HCL CHEMO IV INJECTION 2 MG/ML
50.0000 mg/m2 | Freq: Once | INTRAVENOUS | Status: AC
  Administered 2017-10-17: 92 mg via INTRAVENOUS
  Filled 2017-10-17: qty 46

## 2017-10-17 MED ORDER — PALONOSETRON HCL INJECTION 0.25 MG/5ML
INTRAVENOUS | Status: AC
Start: 2017-10-17 — End: ?
  Filled 2017-10-17: qty 5

## 2017-10-17 MED ORDER — SODIUM CHLORIDE 0.9% FLUSH
10.0000 mL | INTRAVENOUS | Status: DC | PRN
  Administered 2017-10-17: 10 mL
  Filled 2017-10-17: qty 10

## 2017-10-17 MED ORDER — MUPIROCIN 2 % EX OINT: 1.0000 "application " | TOPICAL_OINTMENT | Freq: Two times a day (BID) | CUTANEOUS | 0 refills | Status: AC

## 2017-10-17 MED ORDER — DEXAMETHASONE SODIUM PHOSPHATE 10 MG/ML IJ SOLN
INTRAMUSCULAR | Status: AC
  Filled 2017-10-17: qty 1

## 2017-10-17 MED ORDER — PALONOSETRON HCL INJECTION 0.25 MG/5ML
0.2500 mg | Freq: Once | INTRAVENOUS | Status: AC
  Administered 2017-10-17: 0.25 mg via INTRAVENOUS

## 2017-10-17 MED ORDER — SODIUM CHLORIDE 0.9 % IV SOLN
750.0000 mg/m2 | Freq: Once | INTRAVENOUS | Status: AC
  Administered 2017-10-17: 1380 mg via INTRAVENOUS
  Filled 2017-10-17: qty 69

## 2017-10-17 MED ORDER — DEXAMETHASONE SODIUM PHOSPHATE 10 MG/ML IJ SOLN
10.0000 mg | Freq: Once | INTRAMUSCULAR | Status: AC
  Administered 2017-10-17: 10 mg via INTRAVENOUS

## 2017-10-17 MED ORDER — HEPARIN SOD (PORK) LOCK FLUSH 100 UNIT/ML IV SOLN
500.0000 [IU] | Freq: Once | INTRAVENOUS | Status: AC | PRN
  Administered 2017-10-17: 500 [IU]
  Filled 2017-10-17: qty 5

## 2017-10-17 MED ORDER — SODIUM CHLORIDE 0.9 % IV SOLN
Freq: Once | INTRAVENOUS | Status: AC
  Administered 2017-10-17: 10:00:00 via INTRAVENOUS

## 2017-10-17 MED ORDER — SODIUM CHLORIDE 0.9 % IV SOLN
1.8000 mg/kg | Freq: Once | INTRAVENOUS | Status: AC
  Administered 2017-10-17: 130 mg via INTRAVENOUS
  Filled 2017-10-17: qty 26

## 2017-10-17 NOTE — Telephone Encounter (Signed)
Appts already scheduled per 7/23 los - no additional appts added - central radiology to contact patient with ct scan .
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
## 2017-10-17 NOTE — Patient Instructions (Signed)
[facility] Discharge Instructions for Patients Receiving Chemotherapy  Today you received the following chemotherapy agents Adriamycin, Cytoxan, and Brentuximab.  To help prevent nausea and vomiting after your treatment, we encourage you to take your nausea medication as prescribed.   If you develop nausea and vomiting that is not controlled by your nausea medication, call the clinic.   BELOW ARE SYMPTOMS THAT SHOULD BE REPORTED IMMEDIATELY:  *FEVER GREATER THAN 100.5 F  *CHILLS WITH OR WITHOUT FEVER  NAUSEA AND VOMITING THAT IS NOT CONTROLLED WITH YOUR NAUSEA MEDICATION  *UNUSUAL SHORTNESS OF BREATH  *UNUSUAL BRUISING OR BLEEDING  TENDERNESS IN MOUTH AND THROAT WITH OR WITHOUT PRESENCE OF ULCERS  *URINARY PROBLEMS  *BOWEL PROBLEMS  UNUSUAL RASH Items with * indicate a potential emergency and should be followed up as soon as possible.  Feel free to call the clinic should you have any questions or concerns. The clinic phone number is [phone].  Please show the [facility] at check-in to the Emergency Department and triage nurse.  Doxorubicin injection (Adriamycin) What is this medicine? DOXORUBICIN (dox oh ROO bi sin) is a chemotherapy drug. It is used to treat many kinds of cancer like leukemia, lymphoma, neuroblastoma, sarcoma, and Wilms' tumor. It is also used to treat bladder cancer, breast cancer, lung cancer, ovarian cancer, stomach cancer, and thyroid cancer. This medicine may be used for other purposes; ask your health care provider or pharmacist if you have questions. COMMON BRAND NAME(S): Adriamycin, Adriamycin PFS, Adriamycin RDF, Rubex What should I tell my health care provider before I take this medicine? They need to know if you have any of these conditions: -heart disease -history of low blood counts caused by a medicine -liver disease -recent or ongoing radiation therapy -an unusual or allergic reaction to doxorubicin, other  chemotherapy agents, other medicines, foods, dyes, or preservatives -pregnant or trying to get pregnant -breast-feeding How should I use this medicine? This drug is given as an infusion into a vein. It is administered in a hospital or clinic by a specially trained health care professional. If you have pain, swelling, burning or any unusual feeling around the site of your injection, tell your health care professional right away. Talk to your pediatrician regarding the use of this medicine in children. Special care may be needed. Overdosage: If you think you have taken too much of this medicine contact a poison control center or emergency room at once. NOTE: This medicine is only for you. Do not share this medicine with others. What if I miss a dose? It is important not to miss your dose. Call your doctor or health care professional if you are unable to keep an appointment. What may interact with this medicine? This medicine may interact with the following medications: -6-mercaptopurine -paclitaxel -phenytoin -St. John's Wort -trastuzumab -verapamil This list may not describe all possible interactions. Give your health care provider a list of all the medicines, herbs, non-prescription drugs, or dietary supplements you use. Also tell them if you smoke, drink alcohol, or use illegal drugs. Some items may interact with your medicine. What should I watch for while using this medicine? This drug may make you feel generally unwell. This is not uncommon, as chemotherapy can affect healthy cells as well as cancer cells. Report any side effects. Continue your course of treatment even though you feel ill unless your doctor tells you to stop. There is a maximum amount of this medicine you should receive throughout your life. The amount  depends on the medical condition being treated and your overall health. Your doctor will watch how much of this medicine you receive in your lifetime. Tell your doctor if you  have taken this medicine before. You may need blood work done while you are taking this medicine. Your urine may turn red for a few days after your dose. This is not blood. If your urine is dark or brown, call your doctor. In some cases, you may be given additional medicines to help with side effects. Follow all directions for their use. Call your doctor or health care professional for advice if you get a fever, chills or sore throat, or other symptoms of a cold or flu. Do not treat yourself. This drug decreases your body's ability to fight infections. Try to avoid being around people who are sick. This medicine may increase your risk to bruise or bleed. Call your doctor or health care professional if you notice any unusual bleeding. Talk to your doctor about your risk of cancer. You may be more at risk for certain types of cancers if you take this medicine. Do not become pregnant while taking this medicine or for 6 months after stopping it. Women should inform their doctor if they wish to become pregnant or think they might be pregnant. Men should not father a child while taking this medicine and for 6 months after stopping it. There is a potential for serious side effects to an unborn child. Talk to your health care professional or pharmacist for more information. Do not breast-feed an infant while taking this medicine. This medicine has caused ovarian failure in some women and reduced sperm counts in some men This medicine may interfere with the ability to have a child. Talk with your doctor or health care professional if you are concerned about your fertility. What side effects may I notice from receiving this medicine? Side effects that you should report to your doctor or health care professional as soon as possible: -allergic reactions like skin rash, itching or hives, swelling of the face, lips, or tongue -breathing problems -chest pain -fast or irregular heartbeat -low blood counts - this  medicine may decrease the number of white blood cells, red blood cells and platelets. You may be at increased risk for infections and bleeding. -pain, redness, or irritation at site where injected -signs of infection - fever or chills, cough, sore throat, pain or difficulty passing urine -signs of decreased platelets or bleeding - bruising, pinpoint red spots on the skin, black, tarry stools, blood in the urine -swelling of the ankles, feet, hands -tiredness -weakness Side effects that usually do not require medical attention (report to your doctor or health care professional if they continue or are bothersome): -diarrhea -hair loss -mouth sores -nail discoloration or damage -nausea -red colored urine -vomiting This list may not describe all possible side effects. Call your doctor for medical advice about side effects. You may report side effects to FDA at 1-800-FDA-1088. Where should I keep my medicine? This drug is given in a hospital or clinic and will not be stored at home. NOTE: This sheet is a summary. It may not cover all possible information. If you have questions about this medicine, talk to your doctor, pharmacist, or health care provider.  2018 Elsevier/Gold Standard (2015-05-11 11:28:51)   Cyclophosphamide injection (Cytoxan) What is this medicine? CYCLOPHOSPHAMIDE (sye kloe FOSS fa mide) is a chemotherapy drug. It slows the growth of cancer cells. This medicine is used to treat many types  of cancer like lymphoma, myeloma, leukemia, breast cancer, and ovarian cancer, to name a few. This medicine may be used for other purposes; ask your health care provider or pharmacist if you have questions. COMMON BRAND NAME(S): Cytoxan, Neosar What should I tell my health care provider before I take this medicine? They need to know if you have any of these conditions: -blood disorders -history of other chemotherapy -infection -kidney disease -liver disease -recent or ongoing  radiation therapy -tumors in the bone marrow -an unusual or allergic reaction to cyclophosphamide, other chemotherapy, other medicines, foods, dyes, or preservatives -pregnant or trying to get pregnant -breast-feeding How should I use this medicine? This drug is usually given as an injection into a vein or muscle or by infusion into a vein. It is administered in a hospital or clinic by a specially trained health care professional. Talk to your pediatrician regarding the use of this medicine in children. Special care may be needed. Overdosage: If you think you have taken too much of this medicine contact a poison control center or emergency room at once. NOTE: This medicine is only for you. Do not share this medicine with others. What if I miss a dose? It is important not to miss your dose. Call your doctor or health care professional if you are unable to keep an appointment. What may interact with this medicine? This medicine may interact with the following medications: -amiodarone -amphotericin B -azathioprine -certain antiviral medicines for HIV or AIDS such as protease inhibitors (e.g., indinavir, ritonavir) and zidovudine -certain blood pressure medications such as benazepril, captopril, enalapril, fosinopril, lisinopril, moexipril, monopril, perindopril, quinapril, ramipril, trandolapril -certain cancer medications such as anthracyclines (e.g., daunorubicin, doxorubicin), busulfan, cytarabine, paclitaxel, pentostatin, tamoxifen, trastuzumab -certain diuretics such as chlorothiazide, chlorthalidone, hydrochlorothiazide, indapamide, metolazone -certain medicines that treat or prevent blood clots like warfarin -certain muscle relaxants such as succinylcholine -cyclosporine -etanercept -indomethacin -medicines to increase blood counts like filgrastim, pegfilgrastim, sargramostim -medicines used as general anesthesia -metronidazole -natalizumab This list may not describe all possible  interactions. Give your health care provider a list of all the medicines, herbs, non-prescription drugs, or dietary supplements you use. Also tell them if you smoke, drink alcohol, or use illegal drugs. Some items may interact with your medicine. What should I watch for while using this medicine? Visit your doctor for checks on your progress. This drug may make you feel generally unwell. This is not uncommon, as chemotherapy can affect healthy cells as well as cancer cells. Report any side effects. Continue your course of treatment even though you feel ill unless your doctor tells you to stop. Drink water or other fluids as directed. Urinate often, even at night. In some cases, you may be given additional medicines to help with side effects. Follow all directions for their use. Call your doctor or health care professional for advice if you get a fever, chills or sore throat, or other symptoms of a cold or flu. Do not treat yourself. This drug decreases your body's ability to fight infections. Try to avoid being around people who are sick. This medicine may increase your risk to bruise or bleed. Call your doctor or health care professional if you notice any unusual bleeding. Be careful brushing and flossing your teeth or using a toothpick because you may get an infection or bleed more easily. If you have any dental work done, tell your dentist you are receiving this medicine. You may get drowsy or dizzy. Do not drive, use machinery, or do anything  that needs mental alertness until you know how this medicine affects you. Do not become pregnant while taking this medicine or for 1 year after stopping it. Women should inform their doctor if they wish to become pregnant or think they might be pregnant. Men should not father a child while taking this medicine and for 4 months after stopping it. There is a potential for serious side effects to an unborn child. Talk to your health care professional or pharmacist for  more information. Do not breast-feed an infant while taking this medicine. This medicine may interfere with the ability to have a child. This medicine has caused ovarian failure in some women. This medicine has caused reduced sperm counts in some men. You should talk with your doctor or health care professional if you are concerned about your fertility. If you are going to have surgery, tell your doctor or health care professional that you have taken this medicine. What side effects may I notice from receiving this medicine? Side effects that you should report to your doctor or health care professional as soon as possible: -allergic reactions like skin rash, itching or hives, swelling of the face, lips, or tongue -low blood counts - this medicine may decrease the number of white blood cells, red blood cells and platelets. You may be at increased risk for infections and bleeding. -signs of infection - fever or chills, cough, sore throat, pain or difficulty passing urine -signs of decreased platelets or bleeding - bruising, pinpoint red spots on the skin, black, tarry stools, blood in the urine -signs of decreased red blood cells - unusually weak or tired, fainting spells, lightheadedness -breathing problems -dark urine -dizziness -palpitations -swelling of the ankles, feet, hands -trouble passing urine or change in the amount of urine -weight gain -yellowing of the eyes or skin Side effects that usually do not require medical attention (report to your doctor or health care professional if they continue or are bothersome): -changes in nail or skin color -hair loss -missed menstrual periods -mouth sores -nausea, vomiting This list may not describe all possible side effects. Call your doctor for medical advice about side effects. You may report side effects to FDA at 1-800-FDA-1088. Where should I keep my medicine? This drug is given in a hospital or clinic and will not be stored at  home. NOTE: This sheet is a summary. It may not cover all possible information. If you have questions about this medicine, talk to your doctor, pharmacist, or health care provider.  2018 Elsevier/Gold Standard (2012-01-27 16:22:58)  Brentuximab vedotin solution for injection What is this medicine? BRENTUXIMAB VEDOTIN (bren TUX see mab ve DOE tin) is a monoclonal antibody and a chemotherapy drug. It is used for treating Hodgkin lymphoma and certain non-Hodgkin lymphomas, such as anaplastic large-cell lymphoma and mycosis fungoides. This medicine may be used for other purposes; ask your health care provider or pharmacist if you have questions. COMMON BRAND NAME(S): ADCETRIS What should I tell my health care provider before I take this medicine? They need to know if you have any of these conditions: -immune system problems -infection (especially a virus infection such as chickenpox, cold sores, or herpes) -kidney disease -liver disease -low blood counts, like low white cell, platelet, or red cell counts -tingling of the fingers or toes, or other nerve disorder -an unusual or allergic reaction to brentuximab vedotin, other medicines, foods, dyes, or preservatives -pregnant or trying to get pregnant -breast-feeding How should I use this medicine? This medicine is for  infusion into a vein. It is given by a health care professional in a hospital or clinic setting. Talk to your pediatrician regarding the use of this medicine in children. Special care may be needed. Overdosage: If you think you have taken too much of this medicine contact a poison control center or emergency room at once. NOTE: This medicine is only for you. Do not share this medicine with others. What if I miss a dose? It is important not to miss your dose. Call your doctor or health care professional if you are unable to keep an appointment. What may interact with this medicine? This medicine may interact with the following  medications: -ketoconazole -rifampin -St. John's wort; Hypericum perforatum This list may not describe all possible interactions. Give your health care provider a list of all the medicines, herbs, non-prescription drugs, or dietary supplements you use. Also tell them if you smoke, drink alcohol, or use illegal drugs. Some items may interact with your medicine. What should I watch for while using this medicine? Visit your doctor for checks on your progress. This drug may make you feel generally unwell. Report any side effects. Continue your course of treatment even though you feel ill unless your doctor tells you to stop. Call your doctor or health care professional for advice if you get a fever, chills or sore throat, or other symptoms of a cold or flu. Do not treat yourself. This drug decreases your body's ability to fight infections. Try to avoid being around people who are sick. This medicine may increase your risk to bruise or bleed. Call your doctor or health care professional if you notice any unusual bleeding. In some patients, this medicine may cause a serious brain infection that may cause death. If you have any problems seeing, thinking, speaking, walking, or standing, tell your doctor right away. If you cannot reach your doctor, urgently seek other source of medical care. Do not become pregnant while taking this medicine or for 6 months after stopping it. Women should inform their doctor if they wish to become pregnant or think they might be pregnant. Men should not father a child while taking this medicine and for 6 months after stopping it. There is a potential for serious side effects to an unborn child. Talk to your health care professional or pharmacist for more information. Do not breast-feed an infant while taking this medicine. This may interfere with the ability to father a child. You should talk to your doctor or health care professional if you are concerned about your  fertility. What side effects may I notice from receiving this medicine? Side effects that you should report to your doctor or health care professional as soon as possible: -allergic reactions like skin rash, itching or hives, swelling of the face, lips, or tongue -changes in emotions or moods -diarrhea -low blood counts - this medicine may decrease the number of white blood cells, red blood cells and platelets. You may be at increased risk for infections and bleeding. -pain, tingling, numbness in the hands or feet -redness, blistering, peeling or loosening of the skin, including inside the mouth -shortness of breath -signs of infection - fever or chills, cough, sore throat, pain or difficulty passing urine -signs of decreased platelets or bleeding - bruising, pinpoint red spots on the skin, black, tarry stools, blood in the urine -signs of decreased red blood cells - unusually weak or tired, fainting spells, lightheadedness -signs of liver injury like dark yellow or brown urine; general ill  feeling or flu-like symptoms; light-colored stools; loss of appetite; nausea; right upper belly pain; yellowing of the eyes or skin -stomach pain -sudden numbness or weakness of the face, arm or leg -vomiting Side effects that usually do not require medical attention (report to your doctor or health care professional if they continue or are bothersome): -dizziness -headache -muscle pain -tiredness This list may not describe all possible side effects. Call your doctor for medical advice about side effects. You may report side effects to FDA at 1-800-FDA-1088. Where should I keep my medicine? This drug is given in a hospital or clinic and will not be stored at home. NOTE: This sheet is a summary. It may not cover all possible information. If you have questions about this medicine, talk to your doctor, pharmacist, or health care provider.  2018 [employer]/Gold Standard (2016-02-05 18:06:58)

## 2017-10-19 ENCOUNTER — Inpatient Hospital Stay: Payer: Medicare Other

## 2017-10-19 DIAGNOSIS — Z5111 Encounter for antineoplastic chemotherapy: Secondary | ICD-10-CM | POA: Diagnosis not present

## 2017-10-19 DIAGNOSIS — C8442 Peripheral T-cell lymphoma, not classified, intrathoracic lymph nodes: Secondary | ICD-10-CM

## 2017-10-19 DIAGNOSIS — Z7189 Other specified counseling: Secondary | ICD-10-CM

## 2017-10-19 MED ORDER — PEGFILGRASTIM-CBQV 6 MG/0.6ML ~~LOC~~ SOSY
PREFILLED_SYRINGE | SUBCUTANEOUS | Status: AC
  Filled 2017-10-19: qty 0.6

## 2017-10-19 MED ORDER — PEGFILGRASTIM-CBQV 6 MG/0.6ML ~~LOC~~ SOSY
6.0000 mg | PREFILLED_SYRINGE | Freq: Once | SUBCUTANEOUS | Status: AC
  Administered 2017-10-19: 6 mg via SUBCUTANEOUS

## 2017-10-22 MED ORDER — CEPHALEXIN 500 MG CAP
500 mg | ORAL_CAPSULE | ORAL | 0 refills | Status: DC
Start: 2017-10-22 — End: 2018-03-15

## 2017-10-25 ENCOUNTER — Encounter

## 2017-10-25 MED ORDER — OMEPRAZOLE 40 MG CAP, DELAYED RELEASE
40 mg | ORAL_CAPSULE | ORAL | 0 refills | Status: DC
Start: 2017-10-25 — End: 2018-01-19
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
## 2017-11-06 ENCOUNTER — Encounter: Attending: Internal Medicine | Primary: Internal Medicine

## 2017-11-06 ENCOUNTER — Inpatient Hospital Stay: Payer: Medicare Other

## 2017-11-06 ENCOUNTER — Inpatient Hospital Stay: Payer: Medicare Other | Attending: Hematology | Admitting: *Deleted

## 2017-11-06 ENCOUNTER — Telehealth: Payer: Self-pay | Admitting: Hematology

## 2017-11-06 ENCOUNTER — Inpatient Hospital Stay (HOSPITAL_BASED_OUTPATIENT_CLINIC_OR_DEPARTMENT_OTHER): Payer: Medicare Other | Admitting: Hematology

## 2017-11-06 ENCOUNTER — Encounter: Payer: Self-pay | Admitting: Hematology

## 2017-11-06 VITALS — BP 124/83 | HR 95 | Temp 97.8°F | Resp 18 | Ht 66.0 in | Wt 163.1 lb

## 2017-11-06 DIAGNOSIS — Z87891 Personal history of nicotine dependence: Secondary | ICD-10-CM

## 2017-11-06 DIAGNOSIS — R05 Cough: Secondary | ICD-10-CM

## 2017-11-06 DIAGNOSIS — Z5112 Encounter for antineoplastic immunotherapy: Secondary | ICD-10-CM | POA: Insufficient documentation

## 2017-11-06 DIAGNOSIS — C8442 Peripheral T-cell lymphoma, not classified, intrathoracic lymph nodes: Secondary | ICD-10-CM | POA: Diagnosis present

## 2017-11-06 DIAGNOSIS — Z95828 Presence of other vascular implants and grafts: Secondary | ICD-10-CM

## 2017-11-06 DIAGNOSIS — L02224 Furuncle of groin: Secondary | ICD-10-CM | POA: Diagnosis not present

## 2017-11-06 DIAGNOSIS — M549 Dorsalgia, unspecified: Secondary | ICD-10-CM | POA: Diagnosis not present

## 2017-11-06 DIAGNOSIS — Z7189 Other specified counseling: Secondary | ICD-10-CM

## 2017-11-06 DIAGNOSIS — Z5189 Encounter for other specified aftercare: Secondary | ICD-10-CM | POA: Diagnosis not present

## 2017-11-06 DIAGNOSIS — Z87828 Personal history of other (healed) physical injury and trauma: Secondary | ICD-10-CM

## 2017-11-06 DIAGNOSIS — R918 Other nonspecific abnormal finding of lung field: Secondary | ICD-10-CM | POA: Diagnosis not present

## 2017-11-06 DIAGNOSIS — Z5111 Encounter for antineoplastic chemotherapy: Secondary | ICD-10-CM | POA: Insufficient documentation

## 2017-11-06 DIAGNOSIS — Z905 Acquired absence of kidney: Secondary | ICD-10-CM

## 2017-11-06 DIAGNOSIS — G8929 Other chronic pain: Secondary | ICD-10-CM

## 2017-11-06 DIAGNOSIS — I10 Essential (primary) hypertension: Secondary | ICD-10-CM

## 2017-11-06 LAB — CBC WITH DIFFERENTIAL/PLATELET
Basophils Absolute: 0 10*3/uL (ref 0.0–0.1)
Basophils Relative: 0 %
EOS PCT: 1 %
Eosinophils Absolute: 0.1 10*3/uL (ref 0.0–0.5)
HCT: 37 % — ABNORMAL LOW (ref 38.4–49.9)
Hemoglobin: 12.2 g/dL — ABNORMAL LOW (ref 13.0–17.1)
LYMPHS ABS: 1.9 10*3/uL (ref 0.9–3.3)
LYMPHS PCT: 31 %
MCH: 30.2 pg (ref 27.2–33.4)
MCHC: 33 g/dL (ref 32.0–36.0)
MCV: 91.6 fL (ref 79.3–98.0)
MONO ABS: 0.7 10*3/uL (ref 0.1–0.9)
Monocytes Relative: 12 %
Neutro Abs: 3.4 10*3/uL (ref 1.5–6.5)
Neutrophils Relative %: 56 %
PLATELETS: 311 10*3/uL (ref 140–400)
RBC: 4.04 MIL/uL — AB (ref 4.20–5.82)
RDW: 18.4 % — ABNORMAL HIGH (ref 11.0–14.6)
WBC: 6.1 10*3/uL (ref 4.0–10.3)

## 2017-11-06 LAB — CMP (CANCER CENTER ONLY)
ALBUMIN: 3.7 g/dL (ref 3.5–5.0)
ALT: 16 U/L (ref 0–44)
AST: 15 U/L (ref 15–41)
Alkaline Phosphatase: 91 U/L (ref 38–126)
Anion gap: 10 (ref 5–15)
BUN: 12 mg/dL (ref 6–20)
CHLORIDE: 104 mmol/L (ref 98–111)
CO2: 21 mmol/L — ABNORMAL LOW (ref 22–32)
Calcium: 9.5 mg/dL (ref 8.9–10.3)
Creatinine: 1.13 mg/dL (ref 0.61–1.24)
GFR, Est AFR Am: >60 mL/min (ref >60)
GFR, Estimated: >60 mL/min (ref >60)
GLUCOSE: 123 mg/dL — AB (ref 70–99)
POTASSIUM: 4.1 mmol/L (ref 3.5–5.1)
SODIUM: 135 mmol/L (ref 135–145)
Total Bilirubin: <0.2 mg/dL — ABNORMAL LOW (ref 0.3–1.2)
Total Protein: 7.3 g/dL (ref 6.5–8.1)

## 2017-11-06 LAB — LACTATE DEHYDROGENASE: LDH: 148 U/L (ref 98–192)

## 2017-11-06 MED ORDER — HEPARIN SOD (PORK) LOCK FLUSH 100 UNIT/ML IV SOLN
500.0000 [IU] | Freq: Once | INTRAVENOUS | Status: AC | PRN
  Administered 2017-11-06: 500 [IU]
  Filled 2017-11-06: qty 5

## 2017-11-06 MED ORDER — PALONOSETRON HCL INJECTION 0.25 MG/5ML
0.2500 mg | Freq: Once | INTRAVENOUS | Status: AC
Start: 2017-11-06 — End: 2017-11-06
  Administered 2017-11-06: 0.25 mg via INTRAVENOUS

## 2017-11-06 MED ORDER — SODIUM CHLORIDE 0.9% FLUSH
10.0000 mL | INTRAVENOUS | Status: DC | PRN
  Filled 2017-11-06: qty 10

## 2017-11-06 MED ORDER — SODIUM CHLORIDE 0.9% FLUSH
10.0000 mL | INTRAVENOUS | Status: DC | PRN
  Administered 2017-11-06: 10 mL
  Filled 2017-11-06: qty 10

## 2017-11-06 MED ORDER — SODIUM CHLORIDE 0.9 % IV SOLN
1.8000 mg/kg | Freq: Once | INTRAVENOUS | Status: AC
  Administered 2017-11-06: 130 mg via INTRAVENOUS
  Filled 2017-11-06: qty 26

## 2017-11-06 MED ORDER — DEXAMETHASONE SODIUM PHOSPHATE 10 MG/ML IJ SOLN
INTRAMUSCULAR | Status: AC
  Filled 2017-11-06: qty 1

## 2017-11-06 MED ORDER — SODIUM CHLORIDE 0.9 % IV SOLN
Freq: Once | INTRAVENOUS | Status: AC
  Administered 2017-11-06: 10:00:00 via INTRAVENOUS
  Filled 2017-11-06: qty 250

## 2017-11-06 MED ORDER — PALONOSETRON HCL INJECTION 0.25 MG/5ML
INTRAVENOUS | Status: AC
  Filled 2017-11-06: qty 5

## 2017-11-06 MED ORDER — SODIUM CHLORIDE 0.9 % IV SOLN
750.0000 mg/m2 | Freq: Once | INTRAVENOUS | Status: AC
  Administered 2017-11-06: 1380 mg via INTRAVENOUS
  Filled 2017-11-06: qty 69

## 2017-11-06 MED ORDER — DEXAMETHASONE SODIUM PHOSPHATE 10 MG/ML IJ SOLN
10.0000 mg | Freq: Once | INTRAMUSCULAR | Status: AC
Start: 2017-11-06 — End: 2017-11-06
  Administered 2017-11-06: 10 mg via INTRAVENOUS

## 2017-11-06 MED ORDER — DOXORUBICIN HCL CHEMO IV INJECTION 2 MG/ML
50.0000 mg/m2 | Freq: Once | INTRAVENOUS | Status: AC
  Administered 2017-11-06: 92 mg via INTRAVENOUS
  Filled 2017-11-06: qty 46

## 2017-11-06 NOTE — Patient Instructions (Signed)
[place] Discharge Instructions for Patients Receiving Chemotherapy  Today you received the following chemotherapy agents Adriamycin, Cytoxan, and Brentuximab.  To help prevent nausea and vomiting after your treatment, we encourage you to take your nausea medication as prescribed.   If you develop nausea and vomiting that is not controlled by your nausea medication, call the clinic.   BELOW ARE SYMPTOMS THAT SHOULD BE REPORTED IMMEDIATELY:  *FEVER GREATER THAN 100.5 F  *CHILLS WITH OR WITHOUT FEVER  NAUSEA AND VOMITING THAT IS NOT CONTROLLED WITH YOUR NAUSEA MEDICATION  *UNUSUAL SHORTNESS OF BREATH  *UNUSUAL BRUISING OR BLEEDING  TENDERNESS IN MOUTH AND THROAT WITH OR WITHOUT PRESENCE OF ULCERS  *URINARY PROBLEMS  *BOWEL PROBLEMS  UNUSUAL RASH Items with * indicate a potential emergency and should be followed up as soon as possible.  Feel free to call the clinic should you have any questions or concerns. The clinic phone number is [phone].  Please show the [place] at check-in to the Emergency Department and triage nurse.

## 2017-11-06 NOTE — Telephone Encounter (Signed)
Spoke to patient regarding upcoming aug appt updates per 8/12 sch message.  °

## 2017-11-07 ENCOUNTER — Telehealth: Payer: Self-pay

## 2017-11-07 NOTE — Telephone Encounter (Signed)
Spoke with patient concerning upcoming appointment that was scheduled for him. Per 8/12 los

## 2017-11-08 ENCOUNTER — Inpatient Hospital Stay: Payer: Medicare Other

## 2017-11-08 ENCOUNTER — Telehealth: Payer: Self-pay

## 2017-11-08 DIAGNOSIS — C8442 Peripheral T-cell lymphoma, not classified, intrathoracic lymph nodes: Secondary | ICD-10-CM

## 2017-11-08 DIAGNOSIS — Z7189 Other specified counseling: Secondary | ICD-10-CM

## 2017-11-08 DIAGNOSIS — Z5111 Encounter for antineoplastic chemotherapy: Secondary | ICD-10-CM | POA: Diagnosis not present

## 2017-11-08 MED ORDER — PEGFILGRASTIM-CBQV 6 MG/0.6ML ~~LOC~~ SOSY
PREFILLED_SYRINGE | SUBCUTANEOUS | Status: AC
  Filled 2017-11-08: qty 0.6

## 2017-11-08 MED ORDER — PEGFILGRASTIM-CBQV 6 MG/0.6ML ~~LOC~~ SOSY
6.0000 mg | PREFILLED_SYRINGE | Freq: Once | SUBCUTANEOUS | Status: AC
  Administered 2017-11-08: 6 mg via SUBCUTANEOUS

## 2017-11-08 NOTE — Telephone Encounter (Signed)
Per 8/14 patient request to print a copy of his calender with the upcoming appointments.

## 2017-11-21 ENCOUNTER — Ambulatory Visit (HOSPITAL_COMMUNITY)
Admission: RE | Admit: 2017-11-21 | Discharge: 2017-11-21 | Disposition: A | Discharge status: Home / Self Care | Payer: Medicare Other | Source: Ambulatory Visit | Attending: Hematology | Admitting: Hematology

## 2017-11-21 DIAGNOSIS — C8442 Peripheral T-cell lymphoma, not classified, intrathoracic lymph nodes: Secondary | ICD-10-CM | POA: Insufficient documentation

## 2017-11-21 DIAGNOSIS — R918 Other nonspecific abnormal finding of lung field: Secondary | ICD-10-CM | POA: Insufficient documentation

## 2017-11-21 LAB — GLUCOSE, CAPILLARY: Glucose-Capillary: 111 mg/dL — ABNORMAL HIGH (ref 70–99)

## 2017-11-21 MED ORDER — FLUDEOXYGLUCOSE F - 18 (FDG) INJECTION
8.0500 | Freq: Once | INTRAVENOUS | Status: AC | PRN
  Administered 2017-11-21: 8.05 via INTRAVENOUS
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
## 2017-11-28 ENCOUNTER — Inpatient Hospital Stay: Payer: Medicare Other | Attending: Hematology

## 2017-11-28 ENCOUNTER — Inpatient Hospital Stay: Payer: Medicare Other

## 2017-11-28 ENCOUNTER — Inpatient Hospital Stay (HOSPITAL_BASED_OUTPATIENT_CLINIC_OR_DEPARTMENT_OTHER): Payer: Medicare Other | Admitting: Hematology

## 2017-11-28 ENCOUNTER — Encounter: Payer: Self-pay | Admitting: Hematology

## 2017-11-28 ENCOUNTER — Telehealth: Payer: Self-pay

## 2017-11-28 VITALS — BP 117/81 | HR 87 | Temp 98.0°F | Resp 18 | Ht 66.0 in | Wt 162.1 lb

## 2017-11-28 DIAGNOSIS — Z5111 Encounter for antineoplastic chemotherapy: Secondary | ICD-10-CM

## 2017-11-28 DIAGNOSIS — I10 Essential (primary) hypertension: Secondary | ICD-10-CM

## 2017-11-28 DIAGNOSIS — C8442 Peripheral T-cell lymphoma, not classified, intrathoracic lymph nodes: Secondary | ICD-10-CM

## 2017-11-28 DIAGNOSIS — Z905 Acquired absence of kidney: Secondary | ICD-10-CM | POA: Diagnosis not present

## 2017-11-28 DIAGNOSIS — Z87891 Personal history of nicotine dependence: Secondary | ICD-10-CM

## 2017-11-28 DIAGNOSIS — Z23 Encounter for immunization: Secondary | ICD-10-CM | POA: Insufficient documentation

## 2017-11-28 DIAGNOSIS — Z933 Colostomy status: Secondary | ICD-10-CM

## 2017-11-28 DIAGNOSIS — Z5112 Encounter for antineoplastic immunotherapy: Secondary | ICD-10-CM | POA: Diagnosis present

## 2017-11-28 DIAGNOSIS — Z5189 Encounter for other specified aftercare: Secondary | ICD-10-CM | POA: Diagnosis not present

## 2017-11-28 DIAGNOSIS — Z7189 Other specified counseling: Secondary | ICD-10-CM

## 2017-11-28 DIAGNOSIS — Z87828 Personal history of other (healed) physical injury and trauma: Secondary | ICD-10-CM

## 2017-11-28 DIAGNOSIS — Z95828 Presence of other vascular implants and grafts: Secondary | ICD-10-CM

## 2017-11-28 LAB — CMP (CANCER CENTER ONLY)
ALT: 19 U/L (ref 0–44)
AST: 14 U/L — ABNORMAL LOW (ref 15–41)
Albumin: 3.6 g/dL (ref 3.5–5.0)
Alkaline Phosphatase: 89 U/L (ref 38–126)
Anion gap: 8 (ref 5–15)
BUN: 8 mg/dL (ref 6–20)
CHLORIDE: 106 mmol/L (ref 98–111)
CO2: 23 mmol/L (ref 22–32)
CREATININE: 1.12 mg/dL (ref 0.61–1.24)
Calcium: 9.5 mg/dL (ref 8.9–10.3)
GFR, Est AFR Am: >60 mL/min (ref >60)
Glucose, Bld: 92 mg/dL (ref 70–99)
Potassium: 4 mmol/L (ref 3.5–5.1)
Sodium: 137 mmol/L (ref 135–145)
Total Bilirubin: <0.2 mg/dL — ABNORMAL LOW (ref 0.3–1.2)
Total Protein: 6.9 g/dL (ref 6.5–8.1)

## 2017-11-28 LAB — CBC WITH DIFFERENTIAL/PLATELET
BASOS ABS: 0 10*3/uL (ref 0.0–0.1)
Basophils Relative: 0 %
EOS PCT: 2 %
Eosinophils Absolute: 0.1 10*3/uL (ref 0.0–0.5)
HCT: 34.1 % — ABNORMAL LOW (ref 38.4–49.9)
HEMOGLOBIN: 11.4 g/dL — AB (ref 13.0–17.1)
LYMPHS PCT: 25 %
Lymphs Abs: 1.8 10*3/uL (ref 0.9–3.3)
MCH: 31.5 pg (ref 27.2–33.4)
MCHC: 33.4 g/dL (ref 32.0–36.0)
MCV: 94.2 fL (ref 79.3–98.0)
Monocytes Absolute: 1.5 10*3/uL — ABNORMAL HIGH (ref 0.1–0.9)
Monocytes Relative: 21 %
NEUTROS ABS: 3.8 10*3/uL (ref 1.5–6.5)
NEUTROS PCT: 52 %
PLATELETS: 279 10*3/uL (ref 140–400)
RBC: 3.62 MIL/uL — AB (ref 4.20–5.82)
RDW: 19.9 % — ABNORMAL HIGH (ref 11.0–14.6)
WBC: 7.3 10*3/uL (ref 4.0–10.3)

## 2017-11-28 LAB — LACTATE DEHYDROGENASE: LDH: 153 U/L (ref 98–192)

## 2017-11-28 MED ORDER — SODIUM CHLORIDE 0.9% FLUSH
10.0000 mL | INTRAVENOUS | Status: DC | PRN
  Administered 2017-11-28: 10 mL
  Filled 2017-11-28: qty 10

## 2017-11-28 MED ORDER — SODIUM CHLORIDE 0.9 % IV SOLN
Freq: Once | INTRAVENOUS | Status: AC
  Administered 2017-11-28: 10:00:00 via INTRAVENOUS
  Filled 2017-11-28: qty 250

## 2017-11-28 MED ORDER — SODIUM CHLORIDE 0.9 % IV SOLN
1.8000 mg/kg | Freq: Once | INTRAVENOUS | Status: AC
  Administered 2017-11-28: 130 mg via INTRAVENOUS
  Filled 2017-11-28: qty 26

## 2017-11-28 MED ORDER — DEXAMETHASONE SODIUM PHOSPHATE 10 MG/ML IJ SOLN
10.0000 mg | Freq: Once | INTRAMUSCULAR | Status: AC
  Administered 2017-11-28: 10 mg via INTRAVENOUS

## 2017-11-28 MED ORDER — HEPARIN SOD (PORK) LOCK FLUSH 100 UNIT/ML IV SOLN
500.0000 [IU] | Freq: Once | INTRAVENOUS | Status: AC | PRN
  Administered 2017-11-28: 500 [IU]
  Filled 2017-11-28: qty 5

## 2017-11-28 MED ORDER — DEXAMETHASONE SODIUM PHOSPHATE 10 MG/ML IJ SOLN
INTRAMUSCULAR | Status: AC
  Filled 2017-11-28: qty 1

## 2017-11-28 MED ORDER — PALONOSETRON HCL INJECTION 0.25 MG/5ML
0.2500 mg | Freq: Once | INTRAVENOUS | Status: AC
  Administered 2017-11-28: 0.25 mg via INTRAVENOUS

## 2017-11-28 MED ORDER — DOXORUBICIN HCL CHEMO IV INJECTION 2 MG/ML
50.0000 mg/m2 | Freq: Once | INTRAVENOUS | Status: AC
  Administered 2017-11-28: 92 mg via INTRAVENOUS
  Filled 2017-11-28: qty 46

## 2017-11-28 MED ORDER — SODIUM CHLORIDE 0.9 % IV SOLN
750.0000 mg/m2 | Freq: Once | INTRAVENOUS | Status: AC
  Administered 2017-11-28: 1380 mg via INTRAVENOUS
  Filled 2017-11-28: qty 69

## 2017-11-28 MED ORDER — PALONOSETRON HCL INJECTION 0.25 MG/5ML
INTRAVENOUS | Status: AC
  Filled 2017-11-28: qty 5

## 2017-11-28 NOTE — Telephone Encounter (Signed)
Per 9/3 los already scheduled °

## 2017-11-28 NOTE — Patient Instructions (Signed)
[facility] Discharge Instructions for Patients Receiving Chemotherapy  Today you received the following chemotherapy agents Adriamycin, Cytoxan, and Brentuximab.  To help prevent nausea and vomiting after your treatment, we encourage you to take your nausea medication as prescribed.   If you develop nausea and vomiting that is not controlled by your nausea medication, call the clinic.   BELOW ARE SYMPTOMS THAT SHOULD BE REPORTED IMMEDIATELY:  *FEVER GREATER THAN 100.5 F  *CHILLS WITH OR WITHOUT FEVER  NAUSEA AND VOMITING THAT IS NOT CONTROLLED WITH YOUR NAUSEA MEDICATION  *UNUSUAL SHORTNESS OF BREATH  *UNUSUAL BRUISING OR BLEEDING  TENDERNESS IN MOUTH AND THROAT WITH OR WITHOUT PRESENCE OF ULCERS  *URINARY PROBLEMS  *BOWEL PROBLEMS  UNUSUAL RASH Items with * indicate a potential emergency and should be followed up as soon as possible.  Feel free to call the clinic should you have any questions or concerns. The clinic phone number is [phone].  Please show the [place] at check-in to the Emergency Department and triage nurse.

## 2017-11-30 ENCOUNTER — Inpatient Hospital Stay: Payer: Medicare Other

## 2017-11-30 DIAGNOSIS — C8442 Peripheral T-cell lymphoma, not classified, intrathoracic lymph nodes: Secondary | ICD-10-CM

## 2017-11-30 DIAGNOSIS — Z7189 Other specified counseling: Secondary | ICD-10-CM

## 2017-11-30 DIAGNOSIS — Z5111 Encounter for antineoplastic chemotherapy: Secondary | ICD-10-CM | POA: Diagnosis not present

## 2017-11-30 MED ORDER — PEGFILGRASTIM-CBQV 6 MG/0.6ML ~~LOC~~ SOSY
PREFILLED_SYRINGE | SUBCUTANEOUS | Status: AC
  Filled 2017-11-30: qty 0.6

## 2017-11-30 MED ORDER — PEGFILGRASTIM-CBQV 6 MG/0.6ML ~~LOC~~ SOSY
6.0000 mg | PREFILLED_SYRINGE | Freq: Once | SUBCUTANEOUS | Status: AC
  Administered 2017-11-30: 6 mg via SUBCUTANEOUS

## 2017-11-30 NOTE — Patient Instructions (Signed)
Pegfilgrastim injection What is this medicine? PEGFILGRASTIM (PEG fil gra stim) is a long-acting granulocyte colony-stimulating factor that stimulates the growth of neutrophils, a type of white blood cell important in the body's fight against infection. It is used to reduce the incidence of fever and infection in patients with certain types of cancer who are receiving chemotherapy that affects the bone marrow, and to increase survival after being exposed to high doses of radiation. This medicine may be used for other purposes; ask your health care provider or pharmacist if you have questions. COMMON BRAND NAME(S): Neulasta What should I tell my health care provider before I take this medicine? They need to know if you have any of these conditions: -kidney disease -latex allergy -ongoing radiation therapy -sickle cell disease -skin reactions to acrylic adhesives (On-Body Injector only) -an unusual or allergic reaction to pegfilgrastim, filgrastim, other medicines, foods, dyes, or preservatives -pregnant or trying to get pregnant -breast-feeding How should I use this medicine? This medicine is for injection under the skin. If you get this medicine at home, you will be taught how to prepare and give the pre-filled syringe or how to use the On-body Injector. Refer to the patient Instructions for Use for detailed instructions. Use exactly as directed. Tell your healthcare provider immediately if you suspect that the On-body Injector may not have performed as intended or if you suspect the use of the On-body Injector resulted in a missed or partial dose. It is important that you put your used needles and syringes in a special sharps container. Do not put them in a trash can. If you do not have a sharps container, call your pharmacist or healthcare provider to get one. Talk to your pediatrician regarding the use of this medicine in children. While this drug may be prescribed for selected conditions,  precautions do apply. Overdosage: If you think you have taken too much of this medicine contact a poison control center or emergency room at once. NOTE: This medicine is only for you. Do not share this medicine with others. What if I miss a dose? It is important not to miss your dose. Call your doctor or health care professional if you miss your dose. If you miss a dose due to an On-body Injector failure or leakage, a new dose should be administered as soon as possible using a single prefilled syringe for manual use. What may interact with this medicine? Interactions have not been studied. Give your health care provider a list of all the medicines, herbs, non-prescription drugs, or dietary supplements you use. Also tell them if you smoke, drink alcohol, or use illegal drugs. Some items may interact with your medicine. This list may not describe all possible interactions. Give your health care provider a list of all the medicines, herbs, non-prescription drugs, or dietary supplements you use. Also tell them if you smoke, drink alcohol, or use illegal drugs. Some items may interact with your medicine. What should I watch for while using this medicine? You may need blood work done while you are taking this medicine. If you are going to need a MRI, CT scan, or other procedure, tell your doctor that you are using this medicine (On-Body Injector only). What side effects may I notice from receiving this medicine? Side effects that you should report to your doctor or health care professional as soon as possible: -allergic reactions like skin rash, itching or hives, swelling of the face, lips, or tongue -dizziness -fever -pain, redness, or irritation at site   where injected -pinpoint red spots on the skin -red or dark-brown urine -shortness of breath or breathing problems -stomach or side pain, or pain at the shoulder -swelling -tiredness -trouble passing urine or change in the amount of urine Side  effects that usually do not require medical attention (report to your doctor or health care professional if they continue or are bothersome): -bone pain -muscle pain This list may not describe all possible side effects. Call your doctor for medical advice about side effects. You may report side effects to FDA at 1-800-FDA-1088. Where should I keep my medicine? Keep out of the reach of children. Store pre-filled syringes in a refrigerator between 2 and 8 degrees C (36 and 46 degrees F). Do not freeze. Keep in carton to protect from light. Throw away this medicine if it is left out of the refrigerator for more than 48 hours. Throw away any unused medicine after the expiration date. NOTE: This sheet is a summary. It may not cover all possible information. If you have questions about this medicine, talk to your doctor, pharmacist, or health care provider.  2018 [employer] (2016-03-10 12:58:03)  

## 2017-12-04 ENCOUNTER — Encounter: Payer: Self-pay | Admitting: *Deleted

## 2017-12-04 ENCOUNTER — Ambulatory Visit (INDEPENDENT_AMBULATORY_CARE_PROVIDER_SITE_OTHER): Payer: Medicare Other | Admitting: Pulmonary Disease

## 2017-12-04 ENCOUNTER — Encounter: Payer: Self-pay | Admitting: Pulmonary Disease

## 2017-12-04 VITALS — BP 110/72 | HR 89 | Ht 66.0 in | Wt 161.0 lb

## 2017-12-04 DIAGNOSIS — C8590 Non-Hodgkin lymphoma, unspecified, unspecified site: Secondary | ICD-10-CM | POA: Diagnosis not present

## 2017-12-04 DIAGNOSIS — R059 Cough, unspecified: Secondary | ICD-10-CM

## 2017-12-04 DIAGNOSIS — R05 Cough: Secondary | ICD-10-CM

## 2017-12-04 DIAGNOSIS — F17200 Nicotine dependence, unspecified, uncomplicated: Secondary | ICD-10-CM

## 2017-12-04 LAB — PULMONARY FUNCTION TEST
DL/VA % pred: 68 %
DL/VA: 2.98 ml/min/mmHg/L
DLCO COR % PRED: 58 %
DLCO cor: 15.88 ml/min/mmHg
DLCO unc % pred: 52 %
DLCO unc: 14.23 ml/min/mmHg
FEF 25-75 POST: 4.97 L/s
FEF 25-75 PRE: 4.34 L/s
FEF2575-%CHANGE-POST: 14 %
FEF2575-%Pred-Post: 162 %
FEF2575-%Pred-Pre: 141 %
FEV1-%Change-Post: 2 %
FEV1-%PRED-POST: 116 %
FEV1-%Pred-Pre: 113 %
FEV1-PRE: 3.34 L
FEV1-Post: 3.43 L
FEV1FVC-%CHANGE-POST: 4 %
FEV1FVC-%Pred-Pre: 105 %
FEV6-%CHANGE-POST: -2 %
FEV6-%PRED-POST: 107 %
FEV6-%Pred-Pre: 109 %
FEV6-Post: 3.82 L
FEV6-Pre: 3.9 L
FEV6FVC-%Pred-Post: 103 %
FEV6FVC-%Pred-Pre: 103 %
FVC-%Change-Post: -1 %
FVC-%PRED-POST: 104 %
FVC-%Pred-Pre: 106 %
FVC-Post: 3.83 L
FVC-Pre: 3.9 L
POST FEV1/FVC RATIO: 90 %
POST FEV6/FVC RATIO: 100 %
PRE FEV1/FVC RATIO: 86 %
Pre FEV6/FVC Ratio: 100 %
RV % pred: 40 %
RV: 0.72 L
TLC % PRED: 78 %
TLC: 4.81 L

## 2017-12-04 NOTE — Progress Notes (Signed)
Patient completed full PFT today. 

## 2017-12-04 NOTE — Progress Notes (Deleted)
Patient completed full PFT today. 

## 2017-12-04 NOTE — Patient Instructions (Signed)
I am glad you are getting treatment and responding to therapy Scan of the chest shows that the lung nodules are getting better Follow-up with your oncologist We will see her back in clinic in 6 months.
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
## 2017-12-18 ENCOUNTER — Inpatient Hospital Stay (HOSPITAL_BASED_OUTPATIENT_CLINIC_OR_DEPARTMENT_OTHER): Payer: Medicare Other | Admitting: Hematology

## 2017-12-18 ENCOUNTER — Inpatient Hospital Stay: Payer: Medicare Other

## 2017-12-18 ENCOUNTER — Encounter: Payer: Self-pay | Admitting: Hematology

## 2017-12-18 ENCOUNTER — Telehealth: Payer: Self-pay

## 2017-12-18 VITALS — BP 121/76 | HR 100 | Temp 98.2°F | Resp 18 | Ht 66.0 in | Wt 161.5 lb

## 2017-12-18 DIAGNOSIS — C8442 Peripheral T-cell lymphoma, not classified, intrathoracic lymph nodes: Secondary | ICD-10-CM

## 2017-12-18 DIAGNOSIS — I10 Essential (primary) hypertension: Secondary | ICD-10-CM | POA: Diagnosis not present

## 2017-12-18 DIAGNOSIS — Z7189 Other specified counseling: Secondary | ICD-10-CM

## 2017-12-18 DIAGNOSIS — Z5111 Encounter for antineoplastic chemotherapy: Secondary | ICD-10-CM

## 2017-12-18 DIAGNOSIS — Z87891 Personal history of nicotine dependence: Secondary | ICD-10-CM

## 2017-12-18 DIAGNOSIS — Z905 Acquired absence of kidney: Secondary | ICD-10-CM | POA: Diagnosis not present

## 2017-12-18 DIAGNOSIS — Z23 Encounter for immunization: Secondary | ICD-10-CM

## 2017-12-18 DIAGNOSIS — Z87828 Personal history of other (healed) physical injury and trauma: Secondary | ICD-10-CM

## 2017-12-18 LAB — CBC WITH DIFFERENTIAL/PLATELET
BASOS ABS: 0.1 10*3/uL (ref 0.0–0.1)
Basophils Relative: 1 %
EOS PCT: 1 %
Eosinophils Absolute: 0.1 10*3/uL (ref 0.0–0.5)
HCT: 33.7 % — ABNORMAL LOW (ref 38.4–49.9)
Hemoglobin: 11.5 g/dL — ABNORMAL LOW (ref 13.0–17.1)
LYMPHS PCT: 31 %
Lymphs Abs: 1.5 10*3/uL (ref 0.9–3.3)
MCH: 33.3 pg (ref 27.2–33.4)
MCHC: 33.9 g/dL (ref 32.0–36.0)
MCV: 98.2 fL — ABNORMAL HIGH (ref 79.3–98.0)
Monocytes Absolute: 0.8 10*3/uL (ref 0.1–0.9)
Monocytes Relative: 16 %
Neutro Abs: 2.5 10*3/uL (ref 1.5–6.5)
Neutrophils Relative %: 51 %
PLATELETS: 286 10*3/uL (ref 140–400)
RBC: 3.44 MIL/uL — AB (ref 4.20–5.82)
RDW: 20.4 % — ABNORMAL HIGH (ref 11.0–14.6)
WBC: 4.9 10*3/uL (ref 4.0–10.3)

## 2017-12-18 LAB — LACTATE DEHYDROGENASE: LDH: 147 U/L (ref 98–192)

## 2017-12-18 LAB — CMP (CANCER CENTER ONLY)
ALBUMIN: 3.6 g/dL (ref 3.5–5.0)
ALT: 17 U/L (ref 0–44)
AST: 15 U/L (ref 15–41)
Alkaline Phosphatase: 98 U/L (ref 38–126)
Anion gap: 12 (ref 5–15)
BILIRUBIN TOTAL: 0.3 mg/dL (ref 0.3–1.2)
BUN: 14 mg/dL (ref 6–20)
CO2: 20 mmol/L — ABNORMAL LOW (ref 22–32)
Calcium: 9.4 mg/dL (ref 8.9–10.3)
Chloride: 103 mmol/L (ref 98–111)
Creatinine: 1.36 mg/dL — ABNORMAL HIGH (ref 0.61–1.24)
GFR, Est AFR Am: >60 mL/min (ref >60)
GFR, Estimated: >60 mL/min (ref >60)
GLUCOSE: 153 mg/dL — AB (ref 70–99)
POTASSIUM: 4 mmol/L (ref 3.5–5.1)
Sodium: 135 mmol/L (ref 135–145)
Total Protein: 7 g/dL (ref 6.5–8.1)

## 2017-12-18 MED ORDER — DOXORUBICIN HCL CHEMO IV INJECTION 2 MG/ML
50.0000 mg/m2 | Freq: Once | INTRAVENOUS | Status: AC
  Administered 2017-12-18: 92 mg via INTRAVENOUS
  Filled 2017-12-18: qty 46

## 2017-12-18 MED ORDER — DEXAMETHASONE SODIUM PHOSPHATE 10 MG/ML IJ SOLN
10.0000 mg | Freq: Once | INTRAMUSCULAR | Status: AC
Start: 2017-12-18 — End: 2017-12-18
  Administered 2017-12-18: 10 mg via INTRAVENOUS

## 2017-12-18 MED ORDER — INFLUENZA VAC SPLIT QUAD 0.5 ML IM SUSY
PREFILLED_SYRINGE | INTRAMUSCULAR | Status: AC
  Filled 2017-12-18: qty 0.5

## 2017-12-18 MED ORDER — INFLUENZA VAC SPLIT QUAD 0.5 ML IM SUSY: 0.5000 mL | PREFILLED_SYRINGE | Freq: Once | INTRAMUSCULAR | Status: DC

## 2017-12-18 MED ORDER — SODIUM CHLORIDE 0.9% FLUSH
10.0000 mL | INTRAVENOUS | Status: DC | PRN
  Administered 2017-12-18: 10 mL
  Filled 2017-12-18: qty 10

## 2017-12-18 MED ORDER — DEXAMETHASONE SODIUM PHOSPHATE 10 MG/ML IJ SOLN
INTRAMUSCULAR | Status: AC
  Filled 2017-12-18: qty 1

## 2017-12-18 MED ORDER — SODIUM CHLORIDE 0.9 % IV SOLN
750.0000 mg/m2 | Freq: Once | INTRAVENOUS | Status: AC
  Administered 2017-12-18: 1380 mg via INTRAVENOUS
  Filled 2017-12-18: qty 69

## 2017-12-18 MED ORDER — PALONOSETRON HCL INJECTION 0.25 MG/5ML
0.2500 mg | Freq: Once | INTRAVENOUS | Status: AC
  Administered 2017-12-18: 0.25 mg via INTRAVENOUS

## 2017-12-18 MED ORDER — SODIUM CHLORIDE 0.9 % IV SOLN
1.8000 mg/kg | Freq: Once | INTRAVENOUS | Status: AC
  Administered 2017-12-18: 130 mg via INTRAVENOUS
  Filled 2017-12-18: qty 26

## 2017-12-18 MED ORDER — PALONOSETRON HCL INJECTION 0.25 MG/5ML
INTRAVENOUS | Status: AC
  Filled 2017-12-18: qty 5

## 2017-12-18 MED ORDER — SODIUM CHLORIDE 0.9 % IV SOLN
Freq: Once | INTRAVENOUS | Status: AC
  Administered 2017-12-18: 10:00:00 via INTRAVENOUS
  Filled 2017-12-18: qty 250

## 2017-12-18 MED ORDER — HEPARIN SOD (PORK) LOCK FLUSH 100 UNIT/ML IV SOLN
500.0000 [IU] | Freq: Once | INTRAVENOUS | Status: AC | PRN
  Administered 2017-12-18: 500 [IU]
  Filled 2017-12-18: qty 5

## 2017-12-18 NOTE — Telephone Encounter (Signed)
Patient is aware of flu injection when he return on the 25th. Per 9/23 los

## 2017-12-18 NOTE — Patient Instructions (Signed)
[place] Discharge Instructions for Patients Receiving Chemotherapy  Today you received the following chemotherapy agents Adcetris, Adriamycin, Cytoxan  To help prevent nausea and vomiting after your treatment, we encourage you to take your nausea medication as directed   If you develop nausea and vomiting that is not controlled by your nausea medication, call the clinic.   BELOW ARE SYMPTOMS THAT SHOULD BE REPORTED IMMEDIATELY:  *FEVER GREATER THAN 100.5 F  *CHILLS WITH OR WITHOUT FEVER  NAUSEA AND VOMITING THAT IS NOT CONTROLLED WITH YOUR NAUSEA MEDICATION  *UNUSUAL SHORTNESS OF BREATH  *UNUSUAL BRUISING OR BLEEDING  TENDERNESS IN MOUTH AND THROAT WITH OR WITHOUT PRESENCE OF ULCERS  *URINARY PROBLEMS  *BOWEL PROBLEMS  UNUSUAL RASH Items with * indicate a potential emergency and should be followed up as soon as possible.  Feel free to call the clinic should you have any questions or concerns. The clinic phone number is [phone].  Please show the [place] at check-in to the Emergency Department and triage nurse.

## 2017-12-20 ENCOUNTER — Inpatient Hospital Stay: Payer: Medicare Other

## 2017-12-20 DIAGNOSIS — Z23 Encounter for immunization: Secondary | ICD-10-CM

## 2017-12-20 DIAGNOSIS — Z7189 Other specified counseling: Secondary | ICD-10-CM

## 2017-12-20 DIAGNOSIS — C8442 Peripheral T-cell lymphoma, not classified, intrathoracic lymph nodes: Secondary | ICD-10-CM

## 2017-12-20 DIAGNOSIS — Z5111 Encounter for antineoplastic chemotherapy: Secondary | ICD-10-CM | POA: Diagnosis not present

## 2017-12-20 MED ORDER — PEGFILGRASTIM-CBQV 6 MG/0.6ML ~~LOC~~ SOSY
6.0000 mg | PREFILLED_SYRINGE | Freq: Once | SUBCUTANEOUS | Status: AC
  Administered 2017-12-20: 6 mg via SUBCUTANEOUS

## 2017-12-20 MED ORDER — INFLUENZA VAC SPLIT QUAD 0.5 ML IM SUSY
PREFILLED_SYRINGE | INTRAMUSCULAR | Status: AC
  Filled 2017-12-20: qty 0.5

## 2017-12-20 MED ORDER — INFLUENZA VAC SPLIT QUAD 0.5 ML IM SUSY
0.5000 mL | PREFILLED_SYRINGE | Freq: Once | INTRAMUSCULAR | Status: AC
  Administered 2017-12-20: 0.5 mL via INTRAMUSCULAR

## 2017-12-20 MED ORDER — PEGFILGRASTIM-CBQV 6 MG/0.6ML ~~LOC~~ SOSY
PREFILLED_SYRINGE | SUBCUTANEOUS | Status: AC
  Filled 2017-12-20: qty 0.6

## 2017-12-20 NOTE — Patient Instructions (Signed)
Pegfilgrastim injection What is this medicine? PEGFILGRASTIM (PEG fil gra stim) is a long-acting granulocyte colony-stimulating factor that stimulates the growth of neutrophils, a type of white blood cell important in the body's fight against infection. It is used to reduce the incidence of fever and infection in patients with certain types of cancer who are receiving chemotherapy that affects the bone marrow, and to increase survival after being exposed to high doses of radiation. This medicine may be used for other purposes; ask your health care provider or pharmacist if you have questions. COMMON BRAND NAME(S): Neulasta What should I tell my health care provider before I take this medicine? They need to know if you have any of these conditions: -kidney disease -latex allergy -ongoing radiation therapy -sickle cell disease -skin reactions to acrylic adhesives (On-Body Injector only) -an unusual or allergic reaction to pegfilgrastim, filgrastim, other medicines, foods, dyes, or preservatives -pregnant or trying to get pregnant -breast-feeding How should I use this medicine? This medicine is for injection under the skin. If you get this medicine at home, you will be taught how to prepare and give the pre-filled syringe or how to use the On-body Injector. Refer to the patient Instructions for Use for detailed instructions. Use exactly as directed. Tell your healthcare provider immediately if you suspect that the On-body Injector may not have performed as intended or if you suspect the use of the On-body Injector resulted in a missed or partial dose. It is important that you put your used needles and syringes in a special sharps container. Do not put them in a trash can. If you do not have a sharps container, call your pharmacist or healthcare provider to get one. Talk to your pediatrician regarding the use of this medicine in children. While this drug may be prescribed for selected conditions,  precautions do apply. Overdosage: If you think you have taken too much of this medicine contact a poison control center or emergency room at once. NOTE: This medicine is only for you. Do not share this medicine with others. What if I miss a dose? It is important not to miss your dose. Call your doctor or health care professional if you miss your dose. If you miss a dose due to an On-body Injector failure or leakage, a new dose should be administered as soon as possible using a single prefilled syringe for manual use. What may interact with this medicine? Interactions have not been studied. Give your health care provider a list of all the medicines, herbs, non-prescription drugs, or dietary supplements you use. Also tell them if you smoke, drink alcohol, or use illegal drugs. Some items may interact with your medicine. This list may not describe all possible interactions. Give your health care provider a list of all the medicines, herbs, non-prescription drugs, or dietary supplements you use. Also tell them if you smoke, drink alcohol, or use illegal drugs. Some items may interact with your medicine. What should I watch for while using this medicine? You may need blood work done while you are taking this medicine. If you are going to need a MRI, CT scan, or other procedure, tell your doctor that you are using this medicine (On-Body Injector only). What side effects may I notice from receiving this medicine? Side effects that you should report to your doctor or health care professional as soon as possible: -allergic reactions like skin rash, itching or hives, swelling of the face, lips, or tongue -dizziness -fever -pain, redness, or irritation at site   where injected -pinpoint red spots on the skin -red or dark-brown urine -shortness of breath or breathing problems -stomach or side pain, or pain at the shoulder -swelling -tiredness -trouble passing urine or change in the amount of urine Side  effects that usually do not require medical attention (report to your doctor or health care professional if they continue or are bothersome): -bone pain -muscle pain This list may not describe all possible side effects. Call your doctor for medical advice about side effects. You may report side effects to FDA at 1-800-FDA-1088. Where should I keep my medicine? Keep out of the reach of children. Store pre-filled syringes in a refrigerator between 2 and 8 degrees C (36 and 46 degrees F). Do not freeze. Keep in carton to protect from light. Throw away this medicine if it is left out of the refrigerator for more than 48 hours. Throw away any unused medicine after the expiration date. NOTE: This sheet is a summary. It may not cover all possible information. If you have questions about this medicine, talk to your doctor, pharmacist, or health care provider.  2018 [employer] (2016-03-10 12:58:03)  

## 2017-12-29 ENCOUNTER — Other Ambulatory Visit: Payer: Self-pay | Admitting: Hematology

## 2017-12-29 DIAGNOSIS — C8442 Peripheral T-cell lymphoma, not classified, intrathoracic lymph nodes: Secondary | ICD-10-CM

## 2018-01-02 ENCOUNTER — Telehealth: Payer: Self-pay

## 2018-01-02 NOTE — Telephone Encounter (Signed)
[facility] [address]. Communicated with Legrand Como, [facility] and notified that allopurinol has been d/c for this pt. Pharmacist confirmed medication d/c in their system.

## 2018-01-08 ENCOUNTER — Inpatient Hospital Stay: Payer: Medicare Other

## 2018-01-08 ENCOUNTER — Inpatient Hospital Stay (HOSPITAL_BASED_OUTPATIENT_CLINIC_OR_DEPARTMENT_OTHER): Payer: Medicare Other | Admitting: Hematology

## 2018-01-08 ENCOUNTER — Encounter: Payer: Self-pay | Admitting: Hematology

## 2018-01-08 ENCOUNTER — Inpatient Hospital Stay: Payer: Medicare Other | Attending: Hematology

## 2018-01-08 ENCOUNTER — Ambulatory Visit: Payer: Medicare Other

## 2018-01-08 VITALS — BP 111/80 | HR 93 | Temp 98.0°F | Resp 18 | Ht 66.0 in | Wt 157.6 lb

## 2018-01-08 DIAGNOSIS — Z905 Acquired absence of kidney: Secondary | ICD-10-CM

## 2018-01-08 DIAGNOSIS — Z87828 Personal history of other (healed) physical injury and trauma: Secondary | ICD-10-CM | POA: Diagnosis not present

## 2018-01-08 DIAGNOSIS — C8442 Peripheral T-cell lymphoma, not classified, intrathoracic lymph nodes: Secondary | ICD-10-CM

## 2018-01-08 DIAGNOSIS — I10 Essential (primary) hypertension: Secondary | ICD-10-CM

## 2018-01-08 DIAGNOSIS — Z5111 Encounter for antineoplastic chemotherapy: Secondary | ICD-10-CM | POA: Diagnosis present

## 2018-01-08 DIAGNOSIS — Z5112 Encounter for antineoplastic immunotherapy: Secondary | ICD-10-CM | POA: Insufficient documentation

## 2018-01-08 DIAGNOSIS — Z5189 Encounter for other specified aftercare: Secondary | ICD-10-CM | POA: Insufficient documentation

## 2018-01-08 DIAGNOSIS — Z7189 Other specified counseling: Secondary | ICD-10-CM

## 2018-01-08 DIAGNOSIS — Z87891 Personal history of nicotine dependence: Secondary | ICD-10-CM

## 2018-01-08 LAB — CBC WITH DIFFERENTIAL/PLATELET
Abs Immature Granulocytes: 0.03 10*3/uL (ref 0.00–0.07)
BASOS ABS: 0 10*3/uL (ref 0.0–0.1)
Basophils Relative: 0 %
Eosinophils Absolute: 0 10*3/uL (ref 0.0–0.5)
Eosinophils Relative: 0 %
HEMATOCRIT: 34.6 % — AB (ref 39.0–52.0)
HEMOGLOBIN: 11.7 g/dL — AB (ref 13.0–17.0)
IMMATURE GRANULOCYTES: 0 %
LYMPHS PCT: 9 %
Lymphs Abs: 0.6 10*3/uL — ABNORMAL LOW (ref 0.7–4.0)
MCH: 34.5 pg — ABNORMAL HIGH (ref 26.0–34.0)
MCHC: 33.8 g/dL (ref 30.0–36.0)
MCV: 102.1 fL — ABNORMAL HIGH (ref 80.0–100.0)
Monocytes Absolute: 0.3 10*3/uL (ref 0.1–1.0)
Monocytes Relative: 4 %
NEUTROS PCT: 87 %
NRBC: 0.3 % — AB (ref 0.0–0.2)
Neutro Abs: 5.8 10*3/uL (ref 1.7–7.7)
Platelets: 251 10*3/uL (ref 150–400)
RBC: 3.39 MIL/uL — ABNORMAL LOW (ref 4.22–5.81)
RDW: 17.2 % — ABNORMAL HIGH (ref 11.5–15.5)
WBC: 6.8 10*3/uL (ref 4.0–10.5)

## 2018-01-08 LAB — COMPREHENSIVE METABOLIC PANEL
ALBUMIN: 4 g/dL (ref 3.5–5.0)
ALT: 18 U/L (ref 0–44)
ANION GAP: 8 (ref 5–15)
AST: 24 U/L (ref 15–41)
Alkaline Phosphatase: 84 U/L (ref 38–126)
BUN: 12 mg/dL (ref 6–20)
CALCIUM: 10 mg/dL (ref 8.9–10.3)
CHLORIDE: 105 mmol/L (ref 98–111)
CO2: 24 mmol/L (ref 22–32)
Creatinine, Ser: 1.08 mg/dL (ref 0.61–1.24)
GFR calc non Af Amer: >60 mL/min (ref >60)
GLUCOSE: 161 mg/dL — AB (ref 70–99)
Potassium: 4.2 mmol/L (ref 3.5–5.1)
SODIUM: 137 mmol/L (ref 135–145)
Total Bilirubin: 0.3 mg/dL (ref 0.3–1.2)
Total Protein: 7.6 g/dL (ref 6.5–8.1)

## 2018-01-08 LAB — LACTATE DEHYDROGENASE: LDH: 139 U/L (ref 98–192)

## 2018-01-08 MED ORDER — SODIUM CHLORIDE 0.9 % IV SOLN
1.8000 mg/kg | Freq: Once | INTRAVENOUS | Status: AC
  Administered 2018-01-08: 130 mg via INTRAVENOUS
  Filled 2018-01-08: qty 26

## 2018-01-08 MED ORDER — SODIUM CHLORIDE 0.9% FLUSH
10.0000 mL | INTRAVENOUS | Status: DC | PRN
  Administered 2018-01-08: 10 mL
  Filled 2018-01-08: qty 10

## 2018-01-08 MED ORDER — SODIUM CHLORIDE 0.9 % IV SOLN
750.0000 mg/m2 | Freq: Once | INTRAVENOUS | Status: AC
  Administered 2018-01-08: 1380 mg via INTRAVENOUS
  Filled 2018-01-08: qty 69

## 2018-01-08 MED ORDER — SODIUM CHLORIDE 0.9 % IV SOLN
Freq: Once | INTRAVENOUS | Status: AC
  Administered 2018-01-08: 12:00:00 via INTRAVENOUS
  Filled 2018-01-08: qty 250

## 2018-01-08 MED ORDER — DEXAMETHASONE SODIUM PHOSPHATE 10 MG/ML IJ SOLN
INTRAMUSCULAR | Status: AC
  Filled 2018-01-08: qty 1

## 2018-01-08 MED ORDER — PALONOSETRON HCL INJECTION 0.25 MG/5ML
INTRAVENOUS | Status: AC
  Filled 2018-01-08: qty 5

## 2018-01-08 MED ORDER — DOXORUBICIN HCL CHEMO IV INJECTION 2 MG/ML
50.0000 mg/m2 | Freq: Once | INTRAVENOUS | Status: AC
  Administered 2018-01-08: 92 mg via INTRAVENOUS
  Filled 2018-01-08: qty 46

## 2018-01-08 MED ORDER — HEPARIN SOD (PORK) LOCK FLUSH 100 UNIT/ML IV SOLN
500.0000 [IU] | Freq: Once | INTRAVENOUS | Status: AC | PRN
  Administered 2018-01-08: 500 [IU]
  Filled 2018-01-08: qty 5

## 2018-01-08 MED ORDER — DEXAMETHASONE SODIUM PHOSPHATE 10 MG/ML IJ SOLN
10.0000 mg | Freq: Once | INTRAMUSCULAR | Status: AC
  Administered 2018-01-08: 10 mg via INTRAVENOUS

## 2018-01-08 MED ORDER — PALONOSETRON HCL INJECTION 0.25 MG/5ML
0.2500 mg | Freq: Once | INTRAVENOUS | Status: AC
  Administered 2018-01-08: 0.25 mg via INTRAVENOUS

## 2018-01-08 NOTE — Patient Instructions (Signed)
[facility] Discharge Instructions for Patients Receiving Chemotherapy  Today you received the following chemotherapy agents Adcetris, Adriamycin, Cytoxan  To help prevent nausea and vomiting after your treatment, we encourage you to take your nausea medication as directed   If you develop nausea and vomiting that is not controlled by your nausea medication, call the clinic.   BELOW ARE SYMPTOMS THAT SHOULD BE REPORTED IMMEDIATELY:  *FEVER GREATER THAN 100.5 F  *CHILLS WITH OR WITHOUT FEVER  NAUSEA AND VOMITING THAT IS NOT CONTROLLED WITH YOUR NAUSEA MEDICATION  *UNUSUAL SHORTNESS OF BREATH  *UNUSUAL BRUISING OR BLEEDING  TENDERNESS IN MOUTH AND THROAT WITH OR WITHOUT PRESENCE OF ULCERS  *URINARY PROBLEMS  *BOWEL PROBLEMS  UNUSUAL RASH Items with * indicate a potential emergency and should be followed up as soon as possible.  Feel free to call the clinic should you have any questions or concerns. The clinic phone number is [phone].  Please show the [place] at check-in to the Emergency Department and triage nurse.
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
## 2018-01-10 ENCOUNTER — Other Ambulatory Visit: Payer: Medicare Other

## 2018-01-10 ENCOUNTER — Ambulatory Visit: Payer: Medicare Other | Admitting: Hematology

## 2018-01-10 ENCOUNTER — Inpatient Hospital Stay: Payer: Medicare Other

## 2018-01-10 VITALS — BP 128/76 | HR 101 | Temp 98.4°F | Resp 18

## 2018-01-10 DIAGNOSIS — C8442 Peripheral T-cell lymphoma, not classified, intrathoracic lymph nodes: Secondary | ICD-10-CM

## 2018-01-10 DIAGNOSIS — Z5111 Encounter for antineoplastic chemotherapy: Secondary | ICD-10-CM | POA: Diagnosis not present

## 2018-01-10 DIAGNOSIS — Z7189 Other specified counseling: Secondary | ICD-10-CM

## 2018-01-10 MED ORDER — PEGFILGRASTIM-CBQV 6 MG/0.6ML ~~LOC~~ SOSY
6.0000 mg | PREFILLED_SYRINGE | Freq: Once | SUBCUTANEOUS | Status: AC
  Administered 2018-01-10: 6 mg via SUBCUTANEOUS

## 2018-01-10 MED ORDER — PEGFILGRASTIM-CBQV 6 MG/0.6ML ~~LOC~~ SOSY
PREFILLED_SYRINGE | SUBCUTANEOUS | Status: AC
  Filled 2018-01-10: qty 0.6

## 2018-01-10 NOTE — Patient Instructions (Signed)
Pegfilgrastim injection (UDENYCA) What is this medicine? PEGFILGRASTIM (PEG fil gra stim) is a long-acting granulocyte colony-stimulating factor that stimulates the growth of neutrophils, a type of white blood cell important in the body's fight against infection. It is used to reduce the incidence of fever and infection in patients with certain types of cancer who are receiving chemotherapy that affects the bone marrow, and to increase survival after being exposed to high doses of radiation. This medicine may be used for other purposes; ask your health care provider or pharmacist if you have questions. COMMON BRAND NAME(S): Neulasta,Udenyca What should I tell my health care provider before I take this medicine? They need to know if you have any of these conditions: -kidney disease -latex allergy -ongoing radiation therapy -sickle cell disease -skin reactions to acrylic adhesives (On-Body Injector only) -an unusual or allergic reaction to pegfilgrastim, filgrastim, other medicines, foods, dyes, or preservatives -pregnant or trying to get pregnant -breast-feeding How should I use this medicine? This medicine is for injection under the skin. If you get this medicine at home, you will be taught how to prepare and give the pre-filled syringe or how to use the On-body Injector. Refer to the patient Instructions for Use for detailed instructions. Use exactly as directed. Tell your healthcare provider immediately if you suspect that the On-body Injector may not have performed as intended or if you suspect the use of the On-body Injector resulted in a missed or partial dose. It is important that you put your used needles and syringes in a special sharps container. Do not put them in a trash can. If you do not have a sharps container, call your pharmacist or healthcare provider to get one. Talk to your pediatrician regarding the use of this medicine in children. While this drug may be prescribed for selected  conditions, precautions do apply. Overdosage: If you think you have taken too much of this medicine contact a poison control center or emergency room at once. NOTE: This medicine is only for you. Do not share this medicine with others. What if I miss a dose? It is important not to miss your dose. Call your doctor or health care professional if you miss your dose. If you miss a dose due to an On-body Injector failure or leakage, a new dose should be administered as soon as possible using a single prefilled syringe for manual use. What may interact with this medicine? Interactions have not been studied. Give your health care provider a list of all the medicines, herbs, non-prescription drugs, or dietary supplements you use. Also tell them if you smoke, drink alcohol, or use illegal drugs. Some items may interact with your medicine. This list may not describe all possible interactions. Give your health care provider a list of all the medicines, herbs, non-prescription drugs, or dietary supplements you use. Also tell them if you smoke, drink alcohol, or use illegal drugs. Some items may interact with your medicine. What should I watch for while using this medicine? You may need blood work done while you are taking this medicine. If you are going to need a MRI, CT scan, or other procedure, tell your doctor that you are using this medicine (On-Body Injector only). What side effects may I notice from receiving this medicine? Side effects that you should report to your doctor or health care professional as soon as possible: -allergic reactions like skin rash, itching or hives, swelling of the face, lips, or tongue -dizziness -fever -pain, redness, or irritation at   site where injected -pinpoint red spots on the skin -red or dark-brown urine -shortness of breath or breathing problems -stomach or side pain, or pain at the shoulder -swelling -tiredness -trouble passing urine or change in the amount of  urine Side effects that usually do not require medical attention (report to your doctor or health care professional if they continue or are bothersome): -bone pain -muscle pain This list may not describe all possible side effects. Call your doctor for medical advice about side effects. You may report side effects to FDA at 1-800-FDA-1088. Where should I keep my medicine? Keep out of the reach of children. Store pre-filled syringes in a refrigerator between 2 and 8 degrees C (36 and 46 degrees F). Do not freeze. Keep in carton to protect from light. Throw away this medicine if it is left out of the refrigerator for more than 48 hours. Throw away any unused medicine after the expiration date. NOTE: This sheet is a summary. It may not cover all possible information. If you have questions about this medicine, talk to your doctor, pharmacist, or health care provider.  2018 Elsevier/Gold Standard (2016-03-10 12:58:03)  

## 2018-01-19 ENCOUNTER — Encounter

## 2018-01-19 MED ORDER — OMEPRAZOLE 40 MG CAP, DELAYED RELEASE
40 mg | ORAL_CAPSULE | ORAL | 0 refills | Status: DC
Start: 2018-01-19 — End: 2018-04-19

## 2018-02-05 ENCOUNTER — Telehealth: Payer: Self-pay | Admitting: Medical

## 2018-02-05 ENCOUNTER — Telehealth: Payer: Self-pay | Admitting: *Deleted

## 2018-02-05 NOTE — Telephone Encounter (Signed)
Patient left VM: Getting little bumps all over body. Wanted to know if it was the medicine coming out? Contacted patient for more information, left voice message for patient to call office. Gave information from patient original call to symptom management Clinic - they stated they could see patient 11/12 in afternoon. Left information in voice mail to patient that scheduling will contact him to set up appt for tomorrow afternoon and for patient to contact office.

## 2018-02-05 NOTE — Telephone Encounter (Signed)
Spoke to pt regarding appts per 11/11 sch message  °

## 2018-02-06 ENCOUNTER — Inpatient Hospital Stay: Payer: Medicare Other | Attending: Hematology | Admitting: Medical

## 2018-02-06 VITALS — BP 129/98 | HR 90 | Temp 97.6°F | Resp 18 | Ht 66.0 in | Wt 158.0 lb

## 2018-02-06 DIAGNOSIS — Z905 Acquired absence of kidney: Secondary | ICD-10-CM | POA: Diagnosis not present

## 2018-02-06 DIAGNOSIS — C8442 Peripheral T-cell lymphoma, not classified, intrathoracic lymph nodes: Secondary | ICD-10-CM | POA: Insufficient documentation

## 2018-02-06 DIAGNOSIS — Z87891 Personal history of nicotine dependence: Secondary | ICD-10-CM | POA: Insufficient documentation

## 2018-02-06 DIAGNOSIS — R21 Rash and other nonspecific skin eruption: Secondary | ICD-10-CM | POA: Insufficient documentation

## 2018-02-06 DIAGNOSIS — Z87828 Personal history of other (healed) physical injury and trauma: Secondary | ICD-10-CM | POA: Insufficient documentation

## 2018-02-06 NOTE — Progress Notes (Signed)
Pt presents with diffuse rash on bilat arms, legs, trunk, and back.  No redness or itching, no pus/drainage.  Nontender flesh colored nodules.  Afebrile.  Denies CP/SOB/trouble swallowing.

## 2018-02-06 NOTE — Patient Instructions (Signed)

## 2018-02-08 ENCOUNTER — Ambulatory Visit (HOSPITAL_COMMUNITY)
Admission: RE | Admit: 2018-02-08 | Discharge: 2018-02-08 | Disposition: A | Discharge status: Home / Self Care | Payer: Medicare Other | Source: Ambulatory Visit | Attending: Hematology | Admitting: Hematology

## 2018-02-08 DIAGNOSIS — Z5111 Encounter for antineoplastic chemotherapy: Secondary | ICD-10-CM | POA: Diagnosis present

## 2018-02-08 DIAGNOSIS — C8442 Peripheral T-cell lymphoma, not classified, intrathoracic lymph nodes: Secondary | ICD-10-CM | POA: Insufficient documentation

## 2018-02-08 LAB — GLUCOSE, CAPILLARY: Glucose-Capillary: 143 mg/dL — ABNORMAL HIGH (ref 70–99)

## 2018-02-08 MED ORDER — FLUDEOXYGLUCOSE F - 18 (FDG) INJECTION: 7.9000 | Freq: Once | INTRAVENOUS | Status: DC | PRN
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
## 2018-02-13 ENCOUNTER — Inpatient Hospital Stay: Payer: Medicare Other

## 2018-02-13 ENCOUNTER — Telehealth: Payer: Self-pay

## 2018-02-13 ENCOUNTER — Inpatient Hospital Stay (HOSPITAL_BASED_OUTPATIENT_CLINIC_OR_DEPARTMENT_OTHER): Payer: Medicare Other | Admitting: Hematology

## 2018-02-13 ENCOUNTER — Encounter: Payer: Self-pay | Admitting: Hematology

## 2018-02-13 VITALS — BP 124/78 | HR 84 | Temp 98.1°F | Resp 15 | Ht 66.0 in | Wt 154.1 lb

## 2018-02-13 DIAGNOSIS — C8442 Peripheral T-cell lymphoma, not classified, intrathoracic lymph nodes: Secondary | ICD-10-CM | POA: Diagnosis not present

## 2018-02-13 DIAGNOSIS — Z87891 Personal history of nicotine dependence: Secondary | ICD-10-CM

## 2018-02-13 DIAGNOSIS — Z87828 Personal history of other (healed) physical injury and trauma: Secondary | ICD-10-CM | POA: Diagnosis not present

## 2018-02-13 DIAGNOSIS — Z905 Acquired absence of kidney: Secondary | ICD-10-CM | POA: Diagnosis not present

## 2018-02-13 DIAGNOSIS — Z5111 Encounter for antineoplastic chemotherapy: Secondary | ICD-10-CM

## 2018-02-13 LAB — CBC WITH DIFFERENTIAL/PLATELET
ABS IMMATURE GRANULOCYTES: 0.02 10*3/uL (ref 0.00–0.07)
BASOS PCT: 0 %
Basophils Absolute: 0 10*3/uL (ref 0.0–0.1)
Eosinophils Absolute: 0.2 10*3/uL (ref 0.0–0.5)
Eosinophils Relative: 2 %
HCT: 39.3 % (ref 39.0–52.0)
HEMOGLOBIN: 13.2 g/dL (ref 13.0–17.0)
IMMATURE GRANULOCYTES: 0 %
LYMPHS PCT: 24 %
Lymphs Abs: 1.8 10*3/uL (ref 0.7–4.0)
MCH: 35 pg — AB (ref 26.0–34.0)
MCHC: 33.6 g/dL (ref 30.0–36.0)
MCV: 104.2 fL — ABNORMAL HIGH (ref 80.0–100.0)
MONOS PCT: 18 %
Monocytes Absolute: 1.4 10*3/uL — ABNORMAL HIGH (ref 0.1–1.0)
NEUTROS ABS: 4.3 10*3/uL (ref 1.7–7.7)
NEUTROS PCT: 56 %
PLATELETS: 333 10*3/uL (ref 150–400)
RBC: 3.77 MIL/uL — ABNORMAL LOW (ref 4.22–5.81)
RDW: 13.5 % (ref 11.5–15.5)
WBC: 7.8 10*3/uL (ref 4.0–10.5)
nRBC: 0 % (ref 0.0–0.2)

## 2018-02-13 LAB — CMP (CANCER CENTER ONLY)
ALK PHOS: 106 U/L (ref 38–126)
ALT: 15 U/L (ref 0–44)
AST: 18 U/L (ref 15–41)
Albumin: 3.8 g/dL (ref 3.5–5.0)
Anion gap: 11 (ref 5–15)
BUN: 8 mg/dL (ref 6–20)
CHLORIDE: 104 mmol/L (ref 98–111)
CO2: 23 mmol/L (ref 22–32)
Calcium: 9.7 mg/dL (ref 8.9–10.3)
Creatinine: 1.26 mg/dL — ABNORMAL HIGH (ref 0.61–1.24)
Glucose, Bld: 103 mg/dL — ABNORMAL HIGH (ref 70–99)
Potassium: 4.1 mmol/L (ref 3.5–5.1)
SODIUM: 138 mmol/L (ref 135–145)
TOTAL PROTEIN: 7.5 g/dL (ref 6.5–8.1)
Total Bilirubin: 0.3 mg/dL (ref 0.3–1.2)

## 2018-02-13 LAB — LACTATE DEHYDROGENASE: LDH: 155 U/L (ref 98–192)

## 2018-02-13 NOTE — Patient Instructions (Signed)
Thank you for choosing [facility] to provide your oncology and hematology care.  To afford each patient quality time with our providers, please arrive 30 minutes before your scheduled appointment time.  If you arrive late for your appointment, you may be asked to reschedule.  We strive to give you quality time with our providers, and arriving late affects you and other patients whose appointments are after yours.    If you are a no show for multiple scheduled visits, you may be dismissed from the clinic at the providers discretion.     Again, thank you for choosing [facility], our hope is that these requests will decrease the amount of time that you wait before being seen by our physicians.  ______________________________________________________________________   Should you have questions after your visit to the [facility], please contact our office at [phone] between the hours of 8:30 and 4:30 p.m.    Voicemails left after 4:30p.m will not be returned until the following business day.     For prescription refill requests, please have your pharmacy contact us directly.  Please also try to allow 48 hours for prescription requests.     Please contact the scheduling department for questions regarding scheduling.  For scheduling of procedures such as PET scans, CT scans, MRI, Ultrasound, etc please contact central scheduling at [phone].     Resources For Cancer Patients and Caregivers:    Oncolink.org:  A wonderful resource for patients and healthcare providers for information regarding your disease, ways to tract your treatment, what to expect, etc.      [employer]:  800-227-2345  Can help patients locate various types of support and financial assistance   Cancer Care: 1-800-813-HOPE (4673) Provides financial assistance, online support groups, medication/co-pay assistance.     [place] DSS:  336-641-3447 Where to apply  for food stamps, Medicaid, and utility assistance   Medicare Rights Center: 800-333-4114 Helps people with Medicare understand their rights and benefits, navigate the Medicare system, and secure the quality healthcare they deserve   [employer]: 336-333-6589 [employer]'s shared-ride transportation service for eligible riders who have a disability that prevents them from riding the fixed route bus.     For additional information on assistance programs please contact our social worker:   Juan Mcintosh:  [phone]  

## 2018-02-13 NOTE — Telephone Encounter (Signed)
Printed avs and calender of upcoming appointment. Per 11/19 los 
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
## 2018-02-15 ENCOUNTER — Telehealth: Payer: Self-pay | Admitting: *Deleted

## 2018-02-15 NOTE — Telephone Encounter (Signed)
Contacted Dr. Grace Blight office - [facility]. Delores in patient intake given patient demographics. Transferred to Dr Jolayne Haines office number [phone]. Spoke with Amy who will give patient's information to [place], [place] Patient Coordinator and Dr. Grier Mitts office number. Information requested: Critical notes/inculing last 2-3 office notes, Biopsy results, Scan/imaging results, Recent labs and any additional treatment notes. To be faxed to [phone] once confirmed by [facility].

## 2018-02-15 NOTE — Telephone Encounter (Signed)
Contacted Dr. Grace Blight office [facility]. Delores-patient intake

## 2018-02-20 ENCOUNTER — Telehealth: Payer: Self-pay | Admitting: *Deleted

## 2018-02-20 NOTE — Telephone Encounter (Signed)
Contacted patient: Left VM: Appt with Dr. Olean Ree Seegars at [facility] December 16 at 11am. On 3rd floor of [place]. [phone].  Dr. Jolayne Haines office will be sending information packet to patient.

## 2018-02-21 MED ORDER — TIZANIDINE 4 MG TAB
4 mg | ORAL_TABLET | ORAL | 0 refills | Status: DC
Start: 2018-02-21 — End: 2018-03-15

## 2018-03-15 MED ORDER — TIZANIDINE 4 MG TAB
4 mg | ORAL_TABLET | Freq: Four times a day (QID) | ORAL | 1 refills | Status: DC | PRN
Start: 2018-03-15 — End: 2018-05-23

## 2018-03-15 MED ORDER — CEPHALEXIN 500 MG CAP
500 mg | ORAL_CAPSULE | Freq: Three times a day (TID) | ORAL | 0 refills | Status: DC
Start: 2018-03-15 — End: 2018-04-19

## 2018-03-15 NOTE — Telephone Encounter (Signed)
Requested Prescriptions     Pending Prescriptions Disp Refills   ??? tiZANidine (ZANAFLEX) 4 mg tablet 120 Tab 0   ??? cephALEXin (KEFLEX) 500 mg capsule 30 Cap 0     Refused Prescriptions Disp Refills   ??? cephALEXin (KEFLEX) 500 mg capsule [Pharmacy Med Name: CEPHALEXIN 500MG  CAPSULES] 30 Cap 0     Sig: TAKE 1 CAPSULE BY MOUTH THREE TIMES DAILY     Refused By: HUNTE, NOEL L     Reason for Refusal: Appt required, please call patient

## 2018-03-15 NOTE — Telephone Encounter (Signed)
Noted.

## 2018-03-15 NOTE — Telephone Encounter (Signed)
I informed the pt he will be needing an appointment. He acknowledged and booked in march.

## 2018-03-27 MED ORDER — IBUPROFEN 800 MG TAB
800 mg | ORAL_TABLET | ORAL | 5 refills | Status: DC
Start: 2018-03-27 — End: 2018-09-19

## 2018-04-07 ENCOUNTER — Encounter (HOSPITAL_COMMUNITY): Payer: Self-pay
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
## 2018-04-10 ENCOUNTER — Inpatient Hospital Stay: Payer: Medicare Other | Attending: Hematology

## 2018-04-10 ENCOUNTER — Inpatient Hospital Stay (HOSPITAL_BASED_OUTPATIENT_CLINIC_OR_DEPARTMENT_OTHER): Payer: Medicare Other | Admitting: Hematology

## 2018-04-10 ENCOUNTER — Encounter: Payer: Self-pay | Admitting: Hematology

## 2018-04-10 VITALS — BP 130/91 | HR 91 | Temp 98.2°F | Resp 18 | Ht 66.0 in | Wt 150.3 lb

## 2018-04-10 DIAGNOSIS — Z9221 Personal history of antineoplastic chemotherapy: Secondary | ICD-10-CM | POA: Insufficient documentation

## 2018-04-10 DIAGNOSIS — Z79899 Other long term (current) drug therapy: Secondary | ICD-10-CM | POA: Insufficient documentation

## 2018-04-10 DIAGNOSIS — Z8572 Personal history of non-Hodgkin lymphomas: Secondary | ICD-10-CM | POA: Diagnosis not present

## 2018-04-10 DIAGNOSIS — Z87828 Personal history of other (healed) physical injury and trauma: Secondary | ICD-10-CM

## 2018-04-10 DIAGNOSIS — I10 Essential (primary) hypertension: Secondary | ICD-10-CM | POA: Insufficient documentation

## 2018-04-10 DIAGNOSIS — C8442 Peripheral T-cell lymphoma, not classified, intrathoracic lymph nodes: Secondary | ICD-10-CM | POA: Diagnosis present

## 2018-04-10 DIAGNOSIS — Z905 Acquired absence of kidney: Secondary | ICD-10-CM

## 2018-04-10 DIAGNOSIS — Z87891 Personal history of nicotine dependence: Secondary | ICD-10-CM | POA: Insufficient documentation

## 2018-04-10 LAB — CMP (CANCER CENTER ONLY)
ALK PHOS: 99 U/L (ref 38–126)
ALT: 17 U/L (ref 0–44)
ANION GAP: 9 (ref 5–15)
AST: 22 U/L (ref 15–41)
Albumin: 3.9 g/dL (ref 3.5–5.0)
BILIRUBIN TOTAL: 0.4 mg/dL (ref 0.3–1.2)
BUN: 13 mg/dL (ref 6–20)
CALCIUM: 10 mg/dL (ref 8.9–10.3)
CO2: 23 mmol/L (ref 22–32)
CREATININE: 1.24 mg/dL (ref 0.61–1.24)
Chloride: 106 mmol/L (ref 98–111)
Glucose, Bld: 84 mg/dL (ref 70–99)
Potassium: 4.2 mmol/L (ref 3.5–5.1)
Sodium: 138 mmol/L (ref 135–145)
TOTAL PROTEIN: 7.6 g/dL (ref 6.5–8.1)

## 2018-04-10 LAB — CBC WITH DIFFERENTIAL/PLATELET
Abs Immature Granulocytes: 0.02 10*3/uL (ref 0.00–0.07)
BASOS ABS: 0 10*3/uL (ref 0.0–0.1)
Basophils Relative: 0 %
EOS ABS: 0.2 10*3/uL (ref 0.0–0.5)
EOS PCT: 3 %
HEMATOCRIT: 45.3 % (ref 39.0–52.0)
HEMOGLOBIN: 15.1 g/dL (ref 13.0–17.0)
Immature Granulocytes: 0 %
LYMPHS ABS: 2.5 10*3/uL (ref 0.7–4.0)
Lymphocytes Relative: 32 %
MCH: 33.3 pg (ref 26.0–34.0)
MCHC: 33.3 g/dL (ref 30.0–36.0)
MCV: 100 fL (ref 80.0–100.0)
MONOS PCT: 12 %
Monocytes Absolute: 0.9 10*3/uL (ref 0.1–1.0)
NEUTROS PCT: 53 %
Neutro Abs: 4.1 10*3/uL (ref 1.7–7.7)
Platelets: 249 10*3/uL (ref 150–400)
RBC: 4.53 MIL/uL (ref 4.22–5.81)
RDW: 12.6 % (ref 11.5–15.5)
WBC: 7.7 10*3/uL (ref 4.0–10.5)
nRBC: 0 % (ref 0.0–0.2)

## 2018-04-10 LAB — LACTATE DEHYDROGENASE: LDH: 152 U/L (ref 98–192)

## 2018-04-10 NOTE — Patient Instructions (Signed)
Thank you for choosing [facility] to provide your oncology and hematology care.  To afford each patient quality time with our providers, please arrive 30 minutes before your scheduled appointment time.  If you arrive late for your appointment, you may be asked to reschedule.  We strive to give you quality time with our providers, and arriving late affects you and other patients whose appointments are after yours.    If you are a no show for multiple scheduled visits, you may be dismissed from the clinic at the providers discretion.     Again, thank you for choosing [facility], our hope is that these requests will decrease the amount of time that you wait before being seen by our physicians.  ______________________________________________________________________   Should you have questions after your visit to the [facility], please contact our office at [phone] between the hours of 8:30 and 4:30 p.m.    Voicemails left after 4:30p.m will not be returned until the following business day.     For prescription refill requests, please have your pharmacy contact us directly.  Please also try to allow 48 hours for prescription requests.     Please contact the scheduling department for questions regarding scheduling.  For scheduling of procedures such as PET scans, CT scans, MRI, Ultrasound, etc please contact central scheduling at [phone].     Resources For Cancer Patients and Caregivers:    Oncolink.org:  A wonderful resource for patients and healthcare providers for information regarding your disease, ways to tract your treatment, what to expect, etc.      American Cancer Society:  800-227-2345  Can help patients locate various types of support and financial assistance   Cancer Care: 1-800-813-HOPE (4673) Provides financial assistance, online support groups, medication/co-pay assistance.     [place] DSS:  336-641-3447 Where to apply  for food stamps, Medicaid, and utility assistance   Medicare Rights Center: 800-333-4114 Helps people with Medicare understand their rights and benefits, navigate the Medicare system, and secure the quality healthcare they deserve   SCAT: 336-333-6589 [place] [employer]'s shared-ride transportation service for eligible riders who have a disability that prevents them from riding the fixed route bus.     For additional information on assistance programs please contact our social worker:   Abigail Elmore:  [phone]  

## 2018-04-19 ENCOUNTER — Encounter

## 2018-04-19 MED ORDER — CEPHALEXIN 500 MG CAP
500 mg | ORAL_CAPSULE | ORAL | 0 refills | Status: DC
Start: 2018-04-19 — End: 2018-05-23

## 2018-04-19 MED ORDER — OMEPRAZOLE 40 MG CAP, DELAYED RELEASE
40 mg | ORAL_CAPSULE | ORAL | 0 refills | Status: DC
Start: 2018-04-19 — End: 2018-06-22

## 2018-05-01 ENCOUNTER — Encounter: Attending: Internal Medicine | Primary: Internal Medicine

## 2018-05-23 MED ORDER — CEPHALEXIN 500 MG CAP
500 mg | ORAL_CAPSULE | ORAL | 0 refills | Status: DC
Start: 2018-05-23 — End: 2018-06-22

## 2018-05-23 MED ORDER — TIZANIDINE 4 MG TAB
4 mg | ORAL_TABLET | ORAL | 0 refills | Status: DC
Start: 2018-05-23 — End: 2018-06-23

## 2018-06-01 ENCOUNTER — Encounter: Attending: Internal Medicine | Primary: Internal Medicine

## 2018-06-21 ENCOUNTER — Encounter

## 2018-06-22 MED ORDER — OMEPRAZOLE 40 MG CAP, DELAYED RELEASE
40 mg | ORAL_CAPSULE | ORAL | 0 refills | Status: DC
Start: 2018-06-22 — End: 2018-09-19

## 2018-06-22 MED ORDER — CEPHALEXIN 500 MG CAP
500 mg | ORAL_CAPSULE | ORAL | 0 refills | Status: DC
Start: 2018-06-22 — End: 2018-07-26

## 2018-06-23 MED ORDER — TIZANIDINE 4 MG TAB
4 mg | ORAL_TABLET | ORAL | 0 refills | Status: DC
Start: 2018-06-23 — End: 2018-07-26

## 2018-07-10 ENCOUNTER — Inpatient Hospital Stay: Payer: Medicare Other

## 2018-07-10 ENCOUNTER — Telehealth: Payer: Self-pay | Admitting: *Deleted

## 2018-07-10 ENCOUNTER — Inpatient Hospital Stay: Payer: Medicare Other | Admitting: Hematology

## 2018-07-10 ENCOUNTER — Telehealth: Payer: Self-pay | Admitting: Hematology

## 2018-07-10 NOTE — Telephone Encounter (Signed)
Received msg from [facility] phone screener that patient wants to reschedule appts (Lab/MD) for 4/14. Per Dr. Irene Limbo, can reschedule 1-2 months in future. Patient is in agreement with this plan. Appts for 4/14 cancelled. Schedule msg sent - they will contact patient. Patient verbalized understanding.

## 2018-07-10 NOTE — Telephone Encounter (Signed)
Rescheduled 4/14 appt per sch msg. Mailed printout.

## 2018-07-24 NOTE — Telephone Encounter (Signed)
Pt needs Virtual visit before more refills

## 2018-07-26 MED ORDER — TIZANIDINE 4 MG TAB
4 mg | ORAL_TABLET | ORAL | 2 refills | Status: DC
Start: 2018-07-26 — End: 2018-09-19

## 2018-07-26 MED ORDER — CEPHALEXIN 500 MG CAP
500 mg | ORAL_CAPSULE | ORAL | 0 refills | Status: DC
Start: 2018-07-26 — End: 2018-08-21

## 2018-07-26 NOTE — Telephone Encounter (Signed)
Last Visit: 01/23/17 with MD Hunte  Next Appointment: 09/18/18 with MD Hunte  Previous Refill Encounter(s): 06/23/18 Zanaflex #120 & Keflex #30    Requested Prescriptions     Pending Prescriptions Disp Refills   ??? tiZANidine (ZANAFLEX) 4 mg tablet 120 Tab 0     Sig: TAKE 1 TABLET BY MOUTH EVERY 6 HOURS AS NEEDED FOR PAIN   ??? cephALEXin (KEFLEX) 500 mg capsule 30 Cap 0     Sig: TAKE 1 CAPSULE BY MOUTH THREE TIMES DAILY

## 2018-08-21 MED ORDER — CEPHALEXIN 500 MG CAP
500 mg | ORAL_CAPSULE | ORAL | 0 refills | Status: DC
Start: 2018-08-21 — End: 2018-09-19

## 2018-08-24 ENCOUNTER — Telehealth: Payer: Self-pay | Admitting: *Deleted

## 2018-08-24 ENCOUNTER — Inpatient Hospital Stay: Payer: Medicare Other

## 2018-08-24 ENCOUNTER — Inpatient Hospital Stay: Payer: Medicare Other | Admitting: Hematology

## 2018-08-24 NOTE — Telephone Encounter (Signed)
Received faxed Telephone Advice fax from [facility] Patient called this am at 07:30 to cancel appointments for today. Attempted to contact patient - LVM - appointments for today are cancelled. Message will be sent to scheduling to reschedule appts or he may contact [place] and reschedule at his convenience.

## 2018-08-27 ENCOUNTER — Telehealth: Payer: Self-pay | Admitting: Hematology

## 2018-08-27 NOTE — Telephone Encounter (Signed)
Spoke with patient re 6/12 lab/fu.

## 2018-09-07 ENCOUNTER — Inpatient Hospital Stay: Payer: Medicare Other | Admitting: Hematology

## 2018-09-07 ENCOUNTER — Inpatient Hospital Stay: Payer: Medicare Other

## 2018-09-18 ENCOUNTER — Telehealth: Attending: Internal Medicine | Primary: Internal Medicine

## 2018-09-18 ENCOUNTER — Telehealth
Admit: 2018-09-18 | Discharge: 2018-09-18 | Payer: PRIVATE HEALTH INSURANCE | Attending: Internal Medicine | Primary: Internal Medicine

## 2018-09-18 ENCOUNTER — Encounter: Attending: Internal Medicine | Primary: Internal Medicine

## 2018-09-18 ENCOUNTER — Telehealth: Payer: Self-pay | Admitting: *Deleted

## 2018-09-18 DIAGNOSIS — M545 Low back pain, unspecified: Secondary | ICD-10-CM

## 2018-09-18 DIAGNOSIS — G8929 Other chronic pain: Secondary | ICD-10-CM

## 2018-09-18 NOTE — Progress Notes (Signed)
Dean Walker 48 y.o. male who was seen by synchronous (real-time) audio-video technology on 09/18/18    Consent:  heand/or his healthcare decision maker is aware that this patient-initiated Telehealth encounter is a billable service, with coverage as determined by her insurance carrier. he is aware that he may receive a bill and has provided verbal consent to proceed: Yes I was in my office while conducting this encounter. The patient was in their home.     Dean Walker is a 48 y.o.  male and presents with Skin Problem; Back Pain; Pain (Chronic); and Lymphoma      SUBJECTIVE:  Osteoarthritis and Chronic Pain:  Patient has osteoarthritis, primarily affecting the back.   Symptoms onset: problem is longstanding.  Rheumatological ROS: using Motrin and Zanaflex on a regular basis for pain control.    Response to treatment plan: pt had ETOH and marijuana in [place] so stopped  Norco and will not prescribe narcotic meds due high risk of abuse.     Pt's GERD is well controlled Prilosec     Pt has h/o recurrent boils in groin area and uses Keflex 500 mg daily for prophylaxis especially in light of recent treatment for NHL.  Dean Walker first noticed a cough around April/2019 and had a 20lb weight loss around the same time due to decreased appetite. He presented to the ED on 08/09/17 for worsening SOB and a CTA chest was performed and revealed innumerable pulmonary nodules highly concerning for metastatic disease. 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 Bone marrow biopsy was negative. He completed 6 cycles of Adcetris, Doxorubicin, Cytoxan, and Prednisone without incident in October/2019. Post treatment PET scan in November/2019 revealed complete remission. He was referred here to [facility] for consideration of autologous stem cell transplant but since he was in remission plan ws to monitor q 3 months for now for 2 years. Pt is currently in [place] due  family issues but plans to return to this area in a few months.       Pt's BP has been elevated in the past when he was drinking ETOH.     Respiratory ROS: negative for - shortness of breath  Cardiovascular ROS: negative for - chest pain    Current Outpatient Medications   Medication Sig   ??? cephALEXin (KEFLEX) 500 mg capsule TAKE 1 CAPSULE BY MOUTH THREE TIMES DAILY   ??? tiZANidine (ZANAFLEX) 4 mg tablet TAKE 1 TABLET BY MOUTH EVERY 6 HOURS AS NEEDED FOR PAIN   ??? omeprazole (PRILOSEC) 40 mg capsule TAKE 1 CAPSULE BY MOUTH DAILY   ??? ibuprofen (MOTRIN) 800 mg tablet TAKE 1 TABLET BY MOUTH EVERY 8 HOURS AS NEEDED FOR PAIN     No current facility-administered medications for this visit.          OBJECTIVE:  alert, well appearing, and in no distress  There were no vitals taken for this visit.   well developed and well nourished        Assessment/Plan      ICD-10-CM ICD-9-CM    1. Chronic midline low back pain without sciatica M54.5 724.2 Controlled on Zanaflex and Motrin. Pt declines Cymbalta and cannot take Neurontin or Lyrica     G89.29 338.29    2. Recurrent boils L02.93 680.9 Will continue Keflex 500 mg daily. Pt to sue probiotics    3. 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 Return in about 4 months (around 01/18/2019).         Reviewed plan of care. Patient has provided input and agrees with goals.

## 2018-09-18 NOTE — Progress Notes (Signed)
Dean Walker 48 y.o. male who was seen by synchronous (real-time) audio-video technology on 09/18/18    Consent:  heand/or his healthcare decision maker is aware that this patient-initiated Telehealth encounter is a billable service, with coverage as determined by her insurance carrier. he is aware that he may receive a bill and has provided verbal consent to proceed: Yes I was in my office while conducting this encounter. The patient was in their home.     Dean Walker is a 48 y.o.  male and presents with Skin Problem; Back Pain; Pain (Chronic); and Lymphoma      SUBJECTIVE:  Osteoarthritis and Chronic Pain:  Patient has osteoarthritis, primarily affecting the back.   Symptoms onset: problem is longstanding.  Rheumatological ROS: using Motrin and Zanaflex on a regular basis for pain control.    Response to treatment plan: pt had ETOH and marijuana in UDS so stopped  Norco and will not prescribe narcotic meds due high risk of abuse.     Pt's GERD is well controlled Prilosec     Pt has h/o recurrent boils in groin area and uses Keflex 500 mg daily for prophylaxis especially in light of recent treatment for NHL.  Dean Walker first noticed a cough around April/2019 and had a 20lb weight loss around the same time due to decreased appetite. He presented to the ED on 08/09/17 for worsening SOB and a CTA chest was performed and revealed innumerable pulmonary nodules highly concerning for metastatic disease. 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 Bone marrow biopsy was negative. He completed 6 cycles of Adcetris, Doxorubicin, Cytoxan, and Prednisone without incident in October/2019. Post treatment PET scan in November/2019 revealed complete remission. He was referred here to [facility] for consideration of autologous stem cell transplant but since he was in remission plan ws to monitor q 3 months for now for 2 years. Pt is currently in [place]  due family issues but plans to return to this area in a few months.       Pt's BP has been elevated in the past when he was drinking ETOH.     Respiratory ROS: negative for - shortness of breath  Cardiovascular ROS: negative for - chest pain    Current Outpatient Medications   Medication Sig   ??? cephALEXin (KEFLEX) 500 mg capsule TAKE 1 CAPSULE BY MOUTH THREE TIMES DAILY   ??? tiZANidine (ZANAFLEX) 4 mg tablet TAKE 1 TABLET BY MOUTH EVERY 6 HOURS AS NEEDED FOR PAIN   ??? omeprazole (PRILOSEC) 40 mg capsule TAKE 1 CAPSULE BY MOUTH DAILY   ??? ibuprofen (MOTRIN) 800 mg tablet TAKE 1 TABLET BY MOUTH EVERY 8 HOURS AS NEEDED FOR PAIN     No current facility-administered medications for this visit.          OBJECTIVE:  alert, well appearing, and in no distress  There were no vitals taken for this visit.   well developed and well nourished        Assessment/Plan      ICD-10-CM ICD-9-CM    1. Chronic midline low back pain without sciatica M54.5 724.2 Controlled on Zanaflex and Motrin. Pt declines Cymbalta and cannot take Neurontin or Lyrica     G89.29 338.29    2. Recurrent boils L02.93 680.9 Will continue Keflex 500 mg daily. Pt to sue probiotics    3. 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 Return in about 4 months (around 01/18/2019).         Reviewed plan of care. Patient has provided input and agrees with goals.

## 2018-09-18 NOTE — Telephone Encounter (Signed)
Opened in error

## 2018-09-19 ENCOUNTER — Encounter

## 2018-09-19 MED ORDER — TIZANIDINE 4 MG TAB
4 mg | ORAL_TABLET | ORAL | 2 refills | Status: DC
Start: 2018-09-19 — End: 2018-12-21

## 2018-09-19 MED ORDER — OMEPRAZOLE 40 MG CAP, DELAYED RELEASE
40 mg | ORAL_CAPSULE | Freq: Every day | ORAL | 1 refills | Status: DC
Start: 2018-09-19 — End: 2019-03-04

## 2018-09-19 MED ORDER — IBUPROFEN 800 MG TAB
800 mg | ORAL_TABLET | Freq: Three times a day (TID) | ORAL | 2 refills | Status: DC | PRN
Start: 2018-09-19 — End: 2018-12-21

## 2018-09-19 MED ORDER — CEPHALEXIN 500 MG CAP
500 mg | ORAL_CAPSULE | Freq: Every day | ORAL | 2 refills | Status: DC
Start: 2018-09-19 — End: 2018-12-21

## 2018-09-19 NOTE — Telephone Encounter (Signed)
Please refill for 3 month supply

## 2018-11-15 ENCOUNTER — Telehealth: Payer: Self-pay | Admitting: Hematology

## 2018-11-15 ENCOUNTER — Telehealth: Payer: Self-pay | Admitting: *Deleted

## 2018-11-15 NOTE — Telephone Encounter (Signed)
R/s appt per 8/20 sch message - pt is aware of new appt date and time and reminder letter mailed.

## 2018-11-15 NOTE — Telephone Encounter (Signed)
Patient called - he states he is traveling about the state. Currently in [facility]. Cannot come to appt on 8/28. Wants to reschedule appt to October. Appts for 8/28 cancelled and schedule message sent.

## 2018-11-23 ENCOUNTER — Ambulatory Visit: Payer: Medicare Other | Admitting: Hematology

## 2018-11-23 ENCOUNTER — Other Ambulatory Visit: Payer: Medicare Other

## 2018-12-21 MED ORDER — CEPHALEXIN 500 MG CAP
500 mg | ORAL_CAPSULE | ORAL | 2 refills | Status: DC
Start: 2018-12-21 — End: 2019-03-04

## 2018-12-21 MED ORDER — IBUPROFEN 800 MG TAB
800 mg | ORAL_TABLET | ORAL | 2 refills | Status: DC
Start: 2018-12-21 — End: 2019-03-04

## 2018-12-21 MED ORDER — TIZANIDINE 4 MG TAB
4 mg | ORAL_TABLET | ORAL | 2 refills | Status: DC
Start: 2018-12-21 — End: 2019-03-04

## 2018-12-28 ENCOUNTER — Telehealth: Payer: Self-pay | Admitting: *Deleted

## 2018-12-28 NOTE — Telephone Encounter (Signed)
Patient called - requested appts for 10/22 be cancelled and r/s for 11/4 due to difficulties with transportation. Schedule message sent

## 2018-12-31 ENCOUNTER — Telehealth: Payer: Self-pay | Admitting: Hematology

## 2018-12-31 NOTE — Telephone Encounter (Signed)
Scheduled appt per 10/2 sch message  - pt is aware of appt date and time   

## 2019-01-17 ENCOUNTER — Ambulatory Visit: Payer: Medicare Other | Admitting: Hematology

## 2019-01-17 ENCOUNTER — Other Ambulatory Visit: Payer: Medicare Other

## 2019-01-18 ENCOUNTER — Encounter: Attending: Internal Medicine | Primary: Internal Medicine

## 2019-01-30 ENCOUNTER — Inpatient Hospital Stay: Payer: Medicare Other | Attending: Hematology | Admitting: Hematology

## 2019-01-30 ENCOUNTER — Other Ambulatory Visit: Payer: Self-pay

## 2019-01-30 ENCOUNTER — Inpatient Hospital Stay: Payer: Medicare Other

## 2019-01-30 VITALS — BP 146/93 | HR 88 | Temp 98.0°F | Resp 18 | Ht 66.0 in | Wt 164.4 lb

## 2019-01-30 DIAGNOSIS — C8442 Peripheral T-cell lymphoma, not classified, intrathoracic lymph nodes: Secondary | ICD-10-CM

## 2019-01-30 DIAGNOSIS — I10 Essential (primary) hypertension: Secondary | ICD-10-CM | POA: Insufficient documentation

## 2019-01-30 DIAGNOSIS — C8440 Peripheral T-cell lymphoma, not classified, unspecified site: Secondary | ICD-10-CM | POA: Diagnosis not present

## 2019-01-30 LAB — CBC WITH DIFFERENTIAL/PLATELET
Abs Immature Granulocytes: 0.02 10*3/uL (ref 0.00–0.07)
Basophils Absolute: 0 10*3/uL (ref 0.0–0.1)
Basophils Relative: 0 %
Eosinophils Absolute: 0.1 10*3/uL (ref 0.0–0.5)
Eosinophils Relative: 1 %
HCT: 40.6 % (ref 39.0–52.0)
Hemoglobin: 13.9 g/dL (ref 13.0–17.0)
Immature Granulocytes: 0 %
Lymphocytes Relative: 24 %
Lymphs Abs: 1.6 10*3/uL (ref 0.7–4.0)
MCH: 35.9 pg — ABNORMAL HIGH (ref 26.0–34.0)
MCHC: 34.2 g/dL (ref 30.0–36.0)
MCV: 104.9 fL — ABNORMAL HIGH (ref 80.0–100.0)
Monocytes Absolute: 0.9 10*3/uL (ref 0.1–1.0)
Monocytes Relative: 14 %
Neutro Abs: 3.9 10*3/uL (ref 1.7–7.7)
Neutrophils Relative %: 61 %
Platelets: 248 10*3/uL (ref 150–400)
RBC: 3.87 MIL/uL — ABNORMAL LOW (ref 4.22–5.81)
RDW: 12.2 % (ref 11.5–15.5)
WBC: 6.4 10*3/uL (ref 4.0–10.5)
nRBC: 0 % (ref 0.0–0.2)

## 2019-01-30 LAB — CMP (CANCER CENTER ONLY)
ALT: 20 U/L (ref 0–44)
AST: 19 U/L (ref 15–41)
Albumin: 4 g/dL (ref 3.5–5.0)
Alkaline Phosphatase: 75 U/L (ref 38–126)
Anion gap: 10 (ref 5–15)
BUN: 17 mg/dL (ref 6–20)
CO2: 23 mmol/L (ref 22–32)
Calcium: 9.4 mg/dL (ref 8.9–10.3)
Chloride: 108 mmol/L (ref 98–111)
Creatinine: 1.18 mg/dL (ref 0.61–1.24)
GFR, Est AFR Am: >60 mL/min (ref >60)
GFR, Estimated: >60 mL/min (ref >60)
Glucose, Bld: 95 mg/dL (ref 70–99)
Potassium: 4.6 mmol/L (ref 3.5–5.1)
Sodium: 141 mmol/L (ref 135–145)
Total Bilirubin: 0.3 mg/dL (ref 0.3–1.2)
Total Protein: 7.2 g/dL (ref 6.5–8.1)

## 2019-01-30 LAB — LACTATE DEHYDROGENASE: LDH: 160 U/L (ref 98–192)
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
## 2019-01-31 ENCOUNTER — Telehealth: Payer: Self-pay | Admitting: Hematology

## 2019-01-31 NOTE — Telephone Encounter (Signed)
Per 11/4 los Patient will call to decided on f/u here vs [place]

## 2019-03-04 ENCOUNTER — Ambulatory Visit: Attending: Internal Medicine | Primary: Internal Medicine

## 2019-03-04 ENCOUNTER — Inpatient Hospital Stay: Admit: 2019-03-04 | Payer: MEDICARE | Primary: Internal Medicine

## 2019-03-04 ENCOUNTER — Ambulatory Visit: Admit: 2019-03-04 | Discharge: 2019-03-04 | Payer: MEDICARE | Attending: Internal Medicine | Primary: Internal Medicine

## 2019-03-04 DIAGNOSIS — Z0000 Encounter for general adult medical examination without abnormal findings: Secondary | ICD-10-CM

## 2019-03-04 DIAGNOSIS — Z125 Encounter for screening for malignant neoplasm of prostate: Secondary | ICD-10-CM

## 2019-03-04 LAB — METABOLIC PANEL, COMPREHENSIVE
A-G Ratio: 1.4 (ref 0.8–1.7)
ALT (SGPT): 27 U/L (ref 16–61)
AST (SGOT): 18 U/L (ref 10–38)
Albumin: 4.1 g/dL (ref 3.4–5.0)
Alk. phosphatase: 81 U/L (ref 45–117)
Anion gap: 7 mmol/L (ref 3.0–18)
BUN/Creatinine ratio: 16 (ref 12–20)
BUN: 19 MG/DL — ABNORMAL HIGH (ref 7.0–18)
Bilirubin, total: 0.2 MG/DL (ref 0.2–1.0)
CO2: 22 mmol/L (ref 21–32)
Calcium: 9.4 MG/DL (ref 8.5–10.1)
Chloride: 108 mmol/L (ref 100–111)
Creatinine: 1.18 MG/DL (ref 0.6–1.3)
GFR est AA: >60 mL/min/{1.73_m2} (ref >60)
GFR est non-AA: >60 mL/min/{1.73_m2} (ref >60)
Globulin: 3 g/dL (ref 2.0–4.0)
Glucose: 87 mg/dL (ref 74–99)
Potassium: 4.2 mmol/L (ref 3.5–5.5)
Protein, total: 7.1 g/dL (ref 6.4–8.2)
Sodium: 137 mmol/L (ref 136–145)

## 2019-03-04 LAB — CBC WITH AUTOMATED DIFF
ABS. BASOPHILS: 0 10*3/uL (ref 0.0–0.1)
ABS. EOSINOPHILS: 0.1 10*3/uL (ref 0.0–0.4)
ABS. LYMPHOCYTES: 2.9 10*3/uL (ref 0.9–3.6)
ABS. MONOCYTES: 1.1 10*3/uL (ref 0.05–1.2)
ABS. NEUTROPHILS: 5.8 10*3/uL (ref 1.8–8.0)
BASOPHILS: 0 % (ref 0–2)
EOSINOPHILS: 1 % (ref 0–5)
HCT: 42.8 % (ref 36.0–48.0)
HGB: 14.9 g/dL (ref 13.0–16.0)
LYMPHOCYTES: 29 % (ref 21–52)
MCH: 35.4 PG — ABNORMAL HIGH (ref 24.0–34.0)
MCHC: 34.8 g/dL (ref 31.0–37.0)
MCV: 101.7 FL — ABNORMAL HIGH (ref 74.0–97.0)
MONOCYTES: 11 % — ABNORMAL HIGH (ref 3–10)
MPV: 10.5 FL (ref 9.2–11.8)
NEUTROPHILS: 59 % (ref 40–73)
PLATELET: 268 10*3/uL (ref 135–420)
RBC: 4.21 M/uL — ABNORMAL LOW (ref 4.70–5.50)
RDW: 12.3 % (ref 11.6–14.5)
WBC: 10 10*3/uL (ref 4.6–13.2)

## 2019-03-04 LAB — PSA SCREENING (SCREENING): Prostate Specific Ag: 0.8 ng/mL (ref 0.0–4.0)

## 2019-03-04 LAB — COMPREHENSIVE METABOLIC PANEL
ALT: 27 U/L (ref 16–61)
AST: 18 U/L (ref 10–38)
Albumin/Globulin Ratio: 1.4 (ref 0.8–1.7)
Albumin: 4.1 g/dL (ref 3.4–5.0)
Alkaline Phosphatase: 81 U/L (ref 45–117)
Anion Gap: 7 mmol/L (ref 3.0–18)
BUN: 19 MG/DL — ABNORMAL HIGH (ref 7.0–18)
Bun/Cre Ratio: 16 (ref 12–20)
CO2: 22 mmol/L (ref 21–32)
Calcium: 9.4 MG/DL (ref 8.5–10.1)
Chloride: 108 mmol/L (ref 100–111)
Creatinine: 1.18 MG/DL (ref 0.6–1.3)
EGFR IF NonAfrican American: >60 mL/min/{1.73_m2} (ref >60)
GFR African American: >60 mL/min/{1.73_m2} (ref >60)
Globulin: 3 g/dL (ref 2.0–4.0)
Glucose: 87 mg/dL (ref 74–99)
Potassium: 4.2 mmol/L (ref 3.5–5.5)
Sodium: 137 mmol/L (ref 136–145)
Total Bilirubin: 0.2 MG/DL (ref 0.2–1.0)
Total Protein: 7.1 g/dL (ref 6.4–8.2)

## 2019-03-04 LAB — CBC WITH AUTO DIFFERENTIAL
Basophils %: 0 % (ref 0–2)
Basophils Absolute: 0 10*3/uL (ref 0.0–0.1)
Eosinophils %: 1 % (ref 0–5)
Eosinophils Absolute: 0.1 10*3/uL (ref 0.0–0.4)
Hematocrit: 42.8 % (ref 36.0–48.0)
Hemoglobin: 14.9 g/dL (ref 13.0–16.0)
Lymphocytes %: 29 % (ref 21–52)
Lymphocytes Absolute: 2.9 10*3/uL (ref 0.9–3.6)
MCH: 35.4 PG — ABNORMAL HIGH (ref 24.0–34.0)
MCHC: 34.8 g/dL (ref 31.0–37.0)
MCV: 101.7 FL — ABNORMAL HIGH (ref 74.0–97.0)
MPV: 10.5 FL (ref 9.2–11.8)
Monocytes %: 11 % — ABNORMAL HIGH (ref 3–10)
Monocytes Absolute: 1.1 10*3/uL (ref 0.05–1.2)
Neutrophils %: 59 % (ref 40–73)
Neutrophils Absolute: 5.8 10*3/uL (ref 1.8–8.0)
Platelets: 268 10*3/uL (ref 135–420)
RBC: 4.21 M/uL — ABNORMAL LOW (ref 4.70–5.50)
RDW: 12.3 % (ref 11.6–14.5)
WBC: 10 10*3/uL (ref 4.6–13.2)

## 2019-03-04 LAB — PSA SCREENING: Pros. Spec. Antigen: 0.8 ng/mL (ref 0.0–4.0)

## 2019-03-04 MED ORDER — OMEPRAZOLE 40 MG CAP, DELAYED RELEASE
40 mg | ORAL_CAPSULE | Freq: Every day | ORAL | 1 refills | Status: DC
Start: 2019-03-04 — End: 2019-07-24

## 2019-03-04 MED ORDER — IBUPROFEN 800 MG TAB
800 mg | ORAL_TABLET | ORAL | 4 refills | Status: DC
Start: 2019-03-04 — End: 2019-07-24

## 2019-03-04 MED ORDER — CEPHALEXIN 500 MG CAP
500 mg | ORAL_CAPSULE | ORAL | 2 refills | Status: DC
Start: 2019-03-04 — End: 2019-05-26

## 2019-03-04 MED ORDER — TIZANIDINE 4 MG TAB
4 mg | ORAL_TABLET | Freq: Four times a day (QID) | ORAL | 2 refills | Status: DC | PRN
Start: 2019-03-04 — End: 2019-05-26

## 2019-03-04 NOTE — Progress Notes (Signed)
Patient is aware blood work is good.  Patient verbalizes understanding.

## 2019-03-04 NOTE — Progress Notes (Signed)
This is the Subsequent Medicare Annual Wellness Exam, performed 12 months or more after the Initial AWV or the last Subsequent AWV    I have reviewed the patient's medical history in detail and updated the computerized patient record.     Depression Risk Factor Screening:     3 most recent PHQ Screens 03/04/2019   Little interest or pleasure in doing things Not at all   Feeling down, depressed, irritable, or hopeless Not at all   Total Score PHQ 2 0       Alcohol Risk Screen   Do you average more than 2 drinks per night or 14 drinks a week: No    On any one occasion in the past three months have you have had more than 4 drinks containing alcohol:  No        Functional Ability and Level of Safety:   Hearing: Hearing is good.     Activities of Daily Living:  The home contains: no safety equipment.  Patient does total self care     Ambulation: with no difficulty     Fall Risk:  No flowsheet data found.  Abuse Screen:  Patient is not abused       Cognitive Screening   Has your family/caregiver stated any concerns about your memory: no     Cognitive Screening: Normal - .    Assessment/Plan   Education and counseling provided:  Are appropriate based on today's review and evaluation  End-of-Life planning (with patient's consent)    Diagnoses and all orders for this visit:    1. Medicare annual wellness visit, subsequent    2. Non-Hodgkin's lymphoma, unspecified body region, unspecified non-Hodgkin lymphoma type ([place])  -     [place]; Future  -     CBC WITH AUTOMATED DIFF; Future    3. 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 Gastroesophageal reflux disease without esophagitis  -     omeprazole (PRILOSEC) 40 mg capsule; Take 1 Cap by mouth daily.    5. Glaucoma screening  -     REFERRAL TO OPHTHALMOLOGY    6. Prostate cancer screening  -     PSA SCREENING (SCREENING); Future    7. Chronic midline low back pain without sciatica    8. Recurrent  boils    Other orders  -     cephALEXin (KEFLEX) 500 mg capsule; TAKE 1 CAPSULE BY MOUTH DAILY  -     tiZANidine (ZANAFLEX) 4 mg tablet; Take 1 Tab by mouth every six (6) hours as needed for Muscle Spasm(s).  -     ibuprofen (MOTRIN) 800 mg tablet; TAKE 1 TABLET BY MOUTH EVERY 8 HOURS AS NEEDED FOR PAIN        Health Maintenance Due     Health Maintenance Due   Topic Date Due   ??? DTaP/Tdap/Td series (1 - Tdap) 03/01/1991   ??? Pneumococcal 0-64 years (2 of 3 - PCV13) 08/16/2018   ??? Flu Vaccine (1) 11/27/2018       Patient Care Team   Patient Care Team:  Thana Farr, MD as PCP - General (Internal Medicine)  Thana Farr, MD as PCP - [facility] Empaneled Provider    Glaucoma Screening- Will refer today   Pneumonia Vaccine-  Due at age 40   Shingles Vaccine- Due at age 38   Tdap Vaccine- not UTD  Colonoscopy-  Due at age 89   PSA ordered today   Advance Directive-  Pt given information    History     Patient Active Problem List   Diagnosis Code   ??? Migraine headache G43.909     Past Medical History:   Diagnosis Date   ??? Hypercholesterolemia    ??? Migraine headache       History reviewed. No pertinent surgical history.  Current Outpatient Medications   Medication Sig Dispense Refill   ??? cephALEXin (KEFLEX) 500 mg capsule TAKE 1 CAPSULE BY MOUTH DAILY 30 Cap 2   ??? tiZANidine (ZANAFLEX) 4 mg tablet Take 1 Tab by mouth every six (6) hours as needed for Muscle Spasm(s). 120 Tab 2   ??? ibuprofen (MOTRIN) 800 mg tablet TAKE 1 TABLET BY MOUTH EVERY 8 HOURS AS NEEDED FOR PAIN 90 Tab 4   ??? omeprazole (PRILOSEC) 40 mg capsule Take 1 Cap by mouth daily. 90 Cap 1   ??? multivitamin capsule Take 1 Cap by mouth daily.       Allergies   Allergen Reactions   ??? Gabapentin Other (comments)     Caused foot pain       Family History   Problem Relation Age of Onset   ??? Diabetes Mother    ??? Hypertension Father      Social History     Tobacco Use   ??? Smoking status: Former Smoker     Packs/day: 0.50     Years: 20.00     Pack years: 10.00     Types:  Cigarettes     Last attempt to quit: 07/04/2017     Years since quitting: 1.6   ??? Smokeless tobacco: Never Used   Substance Use Topics   ??? Alcohol use: Yes     Alcohol/week: 12.0 standard drinks     Types: 12 Cans of beer per week     Comment: 12 pk a week     A comprehensive 5 year plan for medical care and screening exams was reviewed with pt and they received a copy of it.

## 2019-03-04 NOTE — Progress Notes (Signed)
Dean Walker is a 48 y.o.  male and presents with Back Pain (f/u); Annual Wellness Visit; Lymphoma (NHL); and Skin Problem      SUBJECTIVE:  Osteoarthritis and Chronic Pain:  Patient has osteoarthritis, primarily affecting the back.   Symptoms onset: problem is longstanding.  Rheumatological ROS: using Motrin and Zanaflex on a regular basis for pain control.    Response to treatment plan: pt had ETOH and marijuana in UDS so stopped  Norco and will not prescribe narcotic meds due high risk of abuse.     Pt's GERD is well controlled Prilosec     Pt has h/o recurrent boils in groin area and uses Keflex 500 mg daily for prophylaxis especially in light of recent treatment for NHL.  Dean Walker first noticed a cough around April/2019 and had a 20lb weight loss around the same time due to decreased appetite. He presented to the ED on 08/09/17 for worsening SOB and a CTA chest was performed and revealed innumerable pulmonary nodules highly concerning for metastatic disease. 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 Bone marrow biopsy was negative. He completed 6 cycles of Adcetris, Doxorubicin, Cytoxan, and Prednisone without incident in October/2019. Post treatment PET scan in November/2019 revealed complete remission. He was referred to [facility] for consideration of autologous stem cell transplant but since he was in remission plan ws to monitor q 3 months for now for 2 years.  Patient received his treatment in [place] but is now back in [place] so will refer to a local oncologist to continue management.  His records from [facility] are in care everywhere.      Pt's BP has been elevated in the past when he was drinking ETOH. Will continue to monitor for now.     Respiratory ROS: negative for - shortness of breath  Cardiovascular ROS: negative for - chest pain    Current Outpatient Medications   Medication Sig   ???  cephALEXin (KEFLEX) 500 mg capsule TAKE 1 CAPSULE BY MOUTH DAILY   ??? tiZANidine (ZANAFLEX) 4 mg tablet Take 1 Tab by mouth every six (6) hours as needed for Muscle Spasm(s).   ??? ibuprofen (MOTRIN) 800 mg tablet TAKE 1 TABLET BY MOUTH EVERY 8 HOURS AS NEEDED FOR PAIN   ??? omeprazole (PRILOSEC) 40 mg capsule Take 1 Cap by mouth daily.   ??? multivitamin capsule Take 1 Cap by mouth daily.     No current facility-administered medications for this visit.          OBJECTIVE:  alert, well appearing, and in no distress  Visit Vitals  BP (!) 135/92   Pulse 86   Temp 97.6 ??F (36.4 ??C) (Temporal)   Resp 20   Ht 5' 6"  (1.676 m)   Wt 166 lb (75.3 kg)   SpO2 99%   BMI 26.79 kg/m??      Chest - clear to auscultation, no wheezes, rales or rhonchi, symmetric air entry  Heart - normal rate, regular rhythm, normal S1, S2, no murmurs, rubs, clicks or gallops  Extremities - peripheral pulses normal, no pedal edema, no clubbing or cyanosis    Results for orders placed or performed during the hospital encounter of 01/23/17   PSA, DIAGNOSTIC (PROSTATE SPECIFIC AG)   Result Value Ref Range    Prostate Specific Ag 0.8 0.0 - 4.0 ng/mL   METABOLIC PANEL, COMPREHENSIVE   Result Value Ref Range  Sodium 137 136 - 145 mmol/L    Potassium 4.3 3.5 - 5.5 mmol/L    Chloride 108 100 - 108 mmol/L    CO2 23 21 - 32 mmol/L    Anion gap 6 3.0 - 18 mmol/L    Glucose 86 74 - 99 mg/dL    BUN 13 7.0 - 18 MG/DL    Creatinine 1.19 0.6 - 1.3 MG/DL    BUN/Creatinine ratio 11 (L) 12 - 20      GFR est AA >60 >60 ml/min/1.82m    GFR est non-AA >60 >60 ml/min/1.775m   Calcium 8.7 8.5 - 10.1 MG/DL    Bilirubin, total 0.3 0.2 - 1.0 MG/DL    ALT (SGPT) 25 16 - 61 U/L    AST (SGOT) 17 15 - 37 U/L    Alk. phosphatase 83 45 - 117 U/L    Protein, total 6.9 6.4 - 8.2 g/dL    Albumin 3.1 (L) 3.4 - 5.0 g/dL    Globulin 3.8 2.0 - 4.0 g/dL    A-G Ratio 0.8 0.8 - 1.7     CBC WITH AUTOMATED DIFF   Result Value Ref Range    WBC 12.1 4.6 - 13.2 K/uL    RBC 4.39 (L) 4.70 - 5.50 M/uL     HGB 15.4 13.0 - 16.0 g/dL    HCT 45.3 36.0 - 48.0 %    MCV 103.2 (H) 74.0 - 97.0 FL    MCH 35.1 (H) 24.0 - 34.0 PG    MCHC 34.0 31.0 - 37.0 g/dL    RDW 12.5 11.6 - 14.5 %    PLATELET 329 135 - 420 K/uL    MPV 10.2 9.2 - 11.8 FL    NEUTROPHILS 69 40 - 73 %    LYMPHOCYTES 16 (L) 21 - 52 %    MONOCYTES 14 (H) 3 - 10 %    EOSINOPHILS 1 0 - 5 %    BASOPHILS 0 0 - 2 %    ABS. NEUTROPHILS 8.4 (H) 1.8 - 8.0 K/UL    ABS. LYMPHOCYTES 1.9 0.9 - 3.6 K/UL    ABS. MONOCYTES 1.7 (H) 0.05 - 1.2 K/UL    ABS. EOSINOPHILS 0.1 0.0 - 0.4 K/UL    ABS. BASOPHILS 0.0 0.0 - 0.1 K/UL    DF AUTOMATED               Assessment/Plan      ICD-10-CM ICD-9-CM    1. Medicare annual wellness visit, subsequent  Z00.00 V70.0    2. Non-Hodgkin's lymphoma, unspecified body region, unspecified non-Hodgkin lymphoma type ([facility] C85.90 202.80 REFERRAL TO HEMATOLOGY      METABOLIC PANEL, COMPREHENSIVE      CBC WITH AUTOMATED DIFF   3. 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  Will refer to hematology for further management   4. Gastroesophageal reflux disease without esophagitis  K21.9 530.81  controlled on omeprazole (PRILOSEC) 40 mg capsule   5. Glaucoma screening  Z13.5 V80.1 REFERRAL TO OPHTHALMOLOGY   6. Prostate cancer screening  Z12.5 V76.44 PSA SCREENING (SCREENING)   7. Chronic midline low back pain without sciatica  M54.5 724.2  controlled on Zanaflex and Motrin.  Patient uses marijuana also for pain    G89.29 338.29    8. Recurrent boils  L02.93 680.9  patient on chronic Keflex for prophylaxis.         Follow-up and Dispositions    ??  Return in about 6 months (around 09/02/2019).         Reviewed plan of care. Patient has provided input and agrees with goals.

## 2019-03-04 NOTE — Progress Notes (Signed)
Tell patient his/her blood work is good

## 2019-03-04 NOTE — Progress Notes (Signed)
Patient is in the office today for a 4 month follow up, and Medicare Wellness VIsit.  Patient is requesting a refill on antibiotic and states per Oncologist he needs to stay on this for a few months.      1. Have you been to the ER, urgent care clinic since your last visit?  Hospitalized since your last visit?No    2. Have you seen or consulted any other health care providers outside of the [facility] since your last visit?  Include any pap smears or colon screening. yes, Dr. Karie Schwalbe at [place]. [place], [place].

## 2019-03-04 NOTE — Progress Notes (Signed)
This is the Subsequent Medicare Annual Wellness Exam, performed 12 months or more after the Initial AWV or the last Subsequent AWV    I have reviewed the patient's medical history in detail and updated the computerized patient record.     Depression Risk Factor Screening:     3 most recent PHQ Screens 03/04/2019   Little interest or pleasure in doing things Not at all   Feeling down, depressed, irritable, or hopeless Not at all   Total Score PHQ 2 0       Alcohol Risk Screen   Do you average more than 2 drinks per night or 14 drinks a week: No    On any one occasion in the past three months have you have had more than 4 drinks containing alcohol:  No        Functional Ability and Level of Safety:   Hearing: Hearing is good.     Activities of Daily Living:  The home contains: no safety equipment.  Patient does total self care     Ambulation: with no difficulty     Fall Risk:  No flowsheet data found.  Abuse Screen:  Patient is not abused       Cognitive Screening   Has your family/caregiver stated any concerns about your memory: no     Cognitive Screening: Normal - .    Assessment/Plan   Education and counseling provided:  Are appropriate based on today's review and evaluation  End-of-Life planning (with patient's consent)    Diagnoses and all orders for this visit:    1. Medicare annual wellness visit, subsequent    2. Non-Hodgkin's lymphoma, unspecified body region, unspecified non-Hodgkin lymphoma type ([place])  -     [place]; Future  -     CBC WITH AUTOMATED DIFF; Future    3. 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 Gastroesophageal reflux disease without esophagitis  -     omeprazole (PRILOSEC) 40 mg capsule; Take 1 Cap by mouth daily.    5. Glaucoma screening  -     REFERRAL TO OPHTHALMOLOGY    6. Prostate cancer screening  -     PSA SCREENING (SCREENING); Future    7. Chronic midline low back pain without sciatica     8. Recurrent boils    Other orders  -     cephALEXin (KEFLEX) 500 mg capsule; TAKE 1 CAPSULE BY MOUTH DAILY  -     tiZANidine (ZANAFLEX) 4 mg tablet; Take 1 Tab by mouth every six (6) hours as needed for Muscle Spasm(s).  -     ibuprofen (MOTRIN) 800 mg tablet; TAKE 1 TABLET BY MOUTH EVERY 8 HOURS AS NEEDED FOR PAIN        Health Maintenance Due     Health Maintenance Due   Topic Date Due   ??? DTaP/Tdap/Td series (1 - Tdap) 03/01/1991   ??? Pneumococcal 0-64 years (2 of 3 - PCV13) 08/16/2018   ??? Flu Vaccine (1) 11/27/2018       Patient Care Team   Patient Care Team:  Thana Farr, MD as PCP - General (Internal Medicine)  Thana Farr, MD as PCP - [facility] Empaneled Provider    Glaucoma Screening- Will refer today   Pneumonia Vaccine-  Due at age 36   Shingles Vaccine- Due at age 44   Tdap Vaccine- not UTD  Colonoscopy-  Due at age 52   PSA ordered today   Advance Directive-  Pt given information    History     Patient Active Problem List   Diagnosis Code   ??? Migraine headache G43.909     Past Medical History:   Diagnosis Date   ??? Hypercholesterolemia    ??? Migraine headache       History reviewed. No pertinent surgical history.  Current Outpatient Medications   Medication Sig Dispense Refill   ??? cephALEXin (KEFLEX) 500 mg capsule TAKE 1 CAPSULE BY MOUTH DAILY 30 Cap 2   ??? tiZANidine (ZANAFLEX) 4 mg tablet Take 1 Tab by mouth every six (6) hours as needed for Muscle Spasm(s). 120 Tab 2   ??? ibuprofen (MOTRIN) 800 mg tablet TAKE 1 TABLET BY MOUTH EVERY 8 HOURS AS NEEDED FOR PAIN 90 Tab 4   ??? omeprazole (PRILOSEC) 40 mg capsule Take 1 Cap by mouth daily. 90 Cap 1   ??? multivitamin capsule Take 1 Cap by mouth daily.       Allergies   Allergen Reactions   ??? Gabapentin Other (comments)     Caused foot pain       Family History   Problem Relation Age of Onset   ??? Diabetes Mother    ??? Hypertension Father      Social History     Tobacco Use   ??? Smoking status: Former Smoker     Packs/day: 0.50     Years: 20.00      Pack years: 10.00     Types: Cigarettes     Last attempt to quit: 07/04/2017     Years since quitting: 1.6   ??? Smokeless tobacco: Never Used   Substance Use Topics   ??? Alcohol use: Yes     Alcohol/week: 12.0 standard drinks     Types: 12 Cans of beer per week     Comment: 12 pk a week     A comprehensive 5 year plan for medical care and screening exams was reviewed with pt and they received a copy of it.

## 2019-03-04 NOTE — Patient Instructions (Addendum)
Back Pain: Care Instructions  Your Care Instructions     Back pain has many possible causes. It is often related to problems with muscles and ligaments of the back. It may also be related to problems with the nerves, discs, or bones of the back. Moving, lifting, standing, sitting, or sleeping in an awkward way can strain the back. Sometimes you don't notice the injury until later. Arthritis is another common cause of back pain.  Although it may hurt a lot, back pain usually improves on its own within several weeks. Most people recover in 12 weeks or less. Using good home treatment and being careful not to stress your back can help you feel better sooner.  Follow-up care is a key part of your treatment and safety. Be sure to make and go to all appointments, and call your doctor if you are having problems. It's also a good idea to know your test results and keep a list of the medicines you take.  How can you care for yourself at home?  ?? Sit or lie in positions that are most comfortable and reduce your pain. Try one of these positions when you lie down:  ? Lie on your back with your knees bent and supported by large pillows.  ? Lie on the floor with your legs on the seat of a sofa or chair.  ? Lie on your side with your knees and hips bent and a pillow between your legs.  ? Lie on your stomach if it does not make pain worse.  ?? Do not sit up in bed, and avoid soft couches and twisted positions. Bed rest can help relieve pain at first, but it delays healing. Avoid bed rest after the first day of back pain.  ?? Change positions every 30 minutes. If you must sit for long periods of time, take breaks from sitting. Get up and walk around, or lie in a comfortable position.  ?? Try using a heating pad on a low or medium setting for 15 to 20 minutes every 2 or 3 hours. Try a warm shower in place of one session with the heating pad.  ?? You can also try an ice pack for 10 to 15 minutes every 2 to 3 hours.  Put a thin cloth between the ice pack and your skin.  ?? Take pain medicines exactly as directed.  ? If the doctor gave you a prescription medicine for pain, take it as prescribed.  ? If you are not taking a prescription pain medicine, ask your doctor if you can take an over-the-counter medicine.  ?? Take short walks several times a day. You can start with 5 to 10 minutes, 3 or 4 times a day, and work up to longer walks. Walk on level surfaces and avoid hills and stairs until your back is better.  ?? Return to work and other activities as soon as you can. Continued rest without activity is usually not good for your back.  ?? To prevent future back pain, do exercises to stretch and strengthen your back and stomach. Learn how to use good posture, safe lifting techniques, and proper body mechanics.  When should you call for help?   Call your doctor now or seek immediate medical care if:  ?? ?? You have new or worsening numbness in your legs.   ?? ?? You have new or worsening weakness in your legs. (This could make it hard to stand up.)   ?? ?? You lose   control of your bladder or bowels.   Watch closely for changes in your health, and be sure to contact your doctor if:  ?? ?? You have a fever, lose weight, or don't feel well.   ?? ?? You do not get better as expected.   Where can you learn more?  Go to http://clayton-rivera.info/  Enter I594 in the search box to learn more about "Back Pain: Care Instructions."  Current as of: May 28, 2018??????????????????????????????Content Version: 12.6  ?? 2006-2020 Healthwise, Inc[employer].   Care instructions adapted under license by [employer] (which disclaims liability or warranty for this information). If you have questions about a medical condition or this instruction, always ask your healthcare professional. [place] any warranty or liability for your use of this information.      Medicare Wellness Visit, Male     The best way to live healthy is to have a lifestyle where you eat a well-balanced diet, exercise regularly, limit alcohol use, and quit all forms of tobacco/nicotine, if applicable.     Regular preventive services are another way to keep healthy. Preventive services (vaccines, screening tests, monitoring & exams) can help personalize your care plan, which helps you manage your own care. Screening tests can find health problems at the earliest stages, when they are easiest to treat.   [facility] follows the current, evidence-based guidelines published by the [place] [employer] (USPSTF) when recommending preventive services for our patients. Because we follow these guidelines, sometimes recommendations change over time as research supports it. (For example, a prostate screening blood test is no longer routinely recommended for men with no symptoms).  Of course, you and your doctor may decide to screen more often for some diseases, based on your risk and co-morbidities (chronic disease you are already diagnosed with).     Preventive services for you include:  - Medicare offers their members a free annual wellness visit, which is time for you and your primary care provider to discuss and plan for your preventive service needs. Take advantage of this benefit every year!  -All adults over age 52 should receive the recommended pneumonia vaccines. Current USPSTF guidelines recommend a series of two vaccines for the best pneumonia protection.   -All adults should have a flu vaccine yearly and tetanus vaccine every 10 years.  -All adults age 65 and older should receive the shingles vaccines (series of two vaccines).       -All adults age 73-70 who are overweight should have a diabetes screening test once every three years.   -Other screening tests & preventive services for persons with diabetes include: an eye exam to screen for diabetic retinopathy, a kidney function  test, a foot exam, and stricter control over your cholesterol.   -Cardiovascular screening for adults with routine risk involves an electrocardiogram (ECG) at intervals determined by the provider.   -Colorectal cancer screening should be done for adults age 44-75 with no increased risk factors for colorectal cancer.  There are a number of acceptable methods of screening for this type of cancer. Each test has its own benefits and drawbacks. Discuss with your provider what is most appropriate for you during your annual wellness visit. The different tests include: colonoscopy (considered the best screening method), a fecal occult blood test, a fecal DNA test, and sigmoidoscopy.  -All adults born between [place] should be screened once for Hepatitis C.  -An Abdominal Aortic Aneurysm (AAA) Screening is  recommended for men age 27-75 who has ever smoked in their lifetime.     Here is a list of your current Health Maintenance items (your personalized list of preventive services) with a due date:  Health Maintenance Due   Topic Date Due   ??? DTaP/Tdap/Td  (1 - Tdap) 03/01/1991   ??? Pneumococcal Vaccine (2 of 3 - PCV13) 08/16/2018   ??? Yearly Flu Vaccine (1) 11/27/2018

## 2019-03-04 NOTE — Progress Notes (Signed)
Dean Walker is a 48 y.o.  male and presents with Back Pain (f/u); Annual Wellness Visit; Lymphoma (NHL); and Skin Problem      SUBJECTIVE:  Osteoarthritis and Chronic Pain:  Patient has osteoarthritis, primarily affecting the back.   Symptoms onset: problem is longstanding.  Rheumatological ROS: using Motrin and Zanaflex on a regular basis for pain control.    Response to treatment plan: pt had ETOH and marijuana in UDS so stopped  Norco and will not prescribe narcotic meds due high risk of abuse.     Pt's GERD is well controlled Prilosec     Pt has h/o recurrent boils in groin area and uses Keflex 500 mg daily for prophylaxis especially in light of recent treatment for NHL.  Mr. Tienda first noticed a cough around April/2019 and had a 20lb weight loss around the same time due to decreased appetite. He presented to the ED on 08/09/17 for worsening SOB and a CTA chest was performed and revealed innumerable pulmonary nodules highly concerning for metastatic disease. 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 Bone marrow biopsy was negative. He completed 6 cycles of Adcetris, Doxorubicin, Cytoxan, and Prednisone without incident in October/2019. Post treatment PET scan in November/2019 revealed complete remission. He was referred to [facility] for consideration of autologous stem cell transplant but since he was in remission plan ws to monitor q 3 months for now for 2 years.  Patient received his treatment in [place] but is now back in [place] so will refer to a local oncologist to continue management.  His records from [facility] are in care everywhere.      Pt's BP has been elevated in the past when he was drinking ETOH. Will continue to monitor for now.     Respiratory ROS: negative for - shortness of breath  Cardiovascular ROS: negative for - chest pain    Current Outpatient Medications   Medication Sig    ??? cephALEXin (KEFLEX) 500 mg capsule TAKE 1 CAPSULE BY MOUTH DAILY   ??? tiZANidine (ZANAFLEX) 4 mg tablet Take 1 Tab by mouth every six (6) hours as needed for Muscle Spasm(s).   ??? ibuprofen (MOTRIN) 800 mg tablet TAKE 1 TABLET BY MOUTH EVERY 8 HOURS AS NEEDED FOR PAIN   ??? omeprazole (PRILOSEC) 40 mg capsule Take 1 Cap by mouth daily.   ??? multivitamin capsule Take 1 Cap by mouth daily.     No current facility-administered medications for this visit.          OBJECTIVE:  alert, well appearing, and in no distress  Visit Vitals  BP (!) 135/92   Pulse 86   Temp 97.6 ??F (36.4 ??C) (Temporal)   Resp 20   Ht 5' 6"  (1.676 m)   Wt 166 lb (75.3 kg)   SpO2 99%   BMI 26.79 kg/m??      Chest - clear to auscultation, no wheezes, rales or rhonchi, symmetric air entry  Heart - normal rate, regular rhythm, normal S1, S2, no murmurs, rubs, clicks or gallops  Extremities - peripheral pulses normal, no pedal edema, no clubbing or cyanosis    Results for orders placed or performed during the hospital encounter of 01/23/17   PSA, DIAGNOSTIC (PROSTATE SPECIFIC AG)   Result Value Ref Range    Prostate Specific Ag 0.8 0.0 - 4.0 ng/mL   METABOLIC PANEL, COMPREHENSIVE   Result Value Ref Range  Sodium 137 136 - 145 mmol/L    Potassium 4.3 3.5 - 5.5 mmol/L    Chloride 108 100 - 108 mmol/L    CO2 23 21 - 32 mmol/L    Anion gap 6 3.0 - 18 mmol/L    Glucose 86 74 - 99 mg/dL    BUN 13 7.0 - 18 MG/DL    Creatinine 1.19 0.6 - 1.3 MG/DL    BUN/Creatinine ratio 11 (L) 12 - 20      GFR est AA >60 >60 ml/min/1.60m    GFR est non-AA >60 >60 ml/min/1.778m   Calcium 8.7 8.5 - 10.1 MG/DL    Bilirubin, total 0.3 0.2 - 1.0 MG/DL    ALT (SGPT) 25 16 - 61 U/L    AST (SGOT) 17 15 - 37 U/L    Alk. phosphatase 83 45 - 117 U/L    Protein, total 6.9 6.4 - 8.2 g/dL    Albumin 3.1 (L) 3.4 - 5.0 g/dL    Globulin 3.8 2.0 - 4.0 g/dL    A-G Ratio 0.8 0.8 - 1.7     CBC WITH AUTOMATED DIFF   Result Value Ref Range    WBC 12.1 4.6 - 13.2 K/uL     RBC 4.39 (L) 4.70 - 5.50 M/uL    HGB 15.4 13.0 - 16.0 g/dL    HCT 45.3 36.0 - 48.0 %    MCV 103.2 (H) 74.0 - 97.0 FL    MCH 35.1 (H) 24.0 - 34.0 PG    MCHC 34.0 31.0 - 37.0 g/dL    RDW 12.5 11.6 - 14.5 %    PLATELET 329 135 - 420 K/uL    MPV 10.2 9.2 - 11.8 FL    NEUTROPHILS 69 40 - 73 %    LYMPHOCYTES 16 (L) 21 - 52 %    MONOCYTES 14 (H) 3 - 10 %    EOSINOPHILS 1 0 - 5 %    BASOPHILS 0 0 - 2 %    ABS. NEUTROPHILS 8.4 (H) 1.8 - 8.0 K/UL    ABS. LYMPHOCYTES 1.9 0.9 - 3.6 K/UL    ABS. MONOCYTES 1.7 (H) 0.05 - 1.2 K/UL    ABS. EOSINOPHILS 0.1 0.0 - 0.4 K/UL    ABS. BASOPHILS 0.0 0.0 - 0.1 K/UL    DF AUTOMATED               Assessment/Plan      ICD-10-CM ICD-9-CM    1. Medicare annual wellness visit, subsequent  Z00.00 V70.0    2. Non-Hodgkin's lymphoma, unspecified body region, unspecified non-Hodgkin lymphoma type ([place] C85.90 202.80 REFERRAL TO HEMATOLOGY      METABOLIC PANEL, COMPREHENSIVE      CBC WITH AUTOMATED DIFF   3. 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  Will refer to hematology for further management   4. Gastroesophageal reflux disease without esophagitis  K21.9 530.81  controlled on omeprazole (PRILOSEC) 40 mg capsule   5. Glaucoma screening  Z13.5 V80.1 REFERRAL TO OPHTHALMOLOGY   6. Prostate cancer screening  Z12.5 V76.44 PSA SCREENING (SCREENING)   7. Chronic midline low back pain without sciatica  M54.5 724.2  controlled on Zanaflex and Motrin.  Patient uses marijuana also for pain    G89.29 338.29    8. Recurrent boils  L02.93 680.9  patient on chronic Keflex for prophylaxis.         Follow-up and Dispositions    ??  Return in about 6 months (around 09/02/2019).         Reviewed plan of care. Patient has provided input and agrees with goals.

## 2019-03-04 NOTE — Progress Notes (Addendum)
Patient is in the office today for a 4 month follow up, and Medicare Wellness VIsit.  Patient is requesting a refill on antibiotic and states per Oncologist he needs to stay on this for a few months.      1. Have you been to the ER, urgent care clinic since your last visit?  Hospitalized since your last visit?No    2. Have you seen or consulted any other health care providers outside of the [facility] since your last visit?  Include any pap smears or colon screening. yes, Dr. Karie Schwalbe at [place]. [place], [place].

## 2019-03-05 NOTE — Progress Notes (Signed)
Tell patient his/her blood work is good

## 2019-03-05 NOTE — Progress Notes (Signed)
Patient is aware blood work is good.  Patient verbalizes understanding.

## 2019-03-12 ENCOUNTER — Telehealth: Payer: Self-pay | Admitting: Hematology

## 2019-03-12 NOTE — Telephone Encounter (Signed)
FAXED RECORDS TO [facility] RELEASE ID [id number]

## 2019-04-29 ENCOUNTER — Encounter

## 2019-05-08 ENCOUNTER — Inpatient Hospital Stay: Admit: 2019-05-08 | Payer: MEDICARE | Attending: Hematology & Oncology | Primary: Internal Medicine

## 2019-05-08 DIAGNOSIS — C8449 Peripheral T-cell lymphoma, not classified, extranodal and solid organ sites: Secondary | ICD-10-CM

## 2019-05-08 LAB — CREATININE, POC
Creatinine, POC: 1.2 MG/DL (ref 0.6–1.3)
GFRAA, POC: >60 mL/min/{1.73_m2} (ref >60)
GFRNA, POC: >60 mL/min/{1.73_m2} (ref >60)

## 2019-05-08 LAB — AMB POC CREATININE
Creatinine, POC: 1.2 MG/DL (ref 0.6–1.3)
GFR African American: >60 mL/min/{1.73_m2} (ref >60)
GFR Non-African American: >60 mL/min/{1.73_m2} (ref >60)

## 2019-05-08 MED ORDER — IOPAMIDOL 61 % IV SOLN
30061 mg iodine /mL (61 %) | Freq: Once | INTRAVENOUS | Status: AC
Start: 2019-05-08 — End: 2019-05-08
  Administered 2019-05-08: 20:00:00 via INTRAVENOUS

## 2019-05-08 MED FILL — ISOVUE-300  61 % INTRAVENOUS SOLUTION: 300 mg iodine /mL (61 %) | INTRAVENOUS | Qty: 100

## 2019-05-26 MED ORDER — TIZANIDINE 4 MG TAB
4 mg | ORAL_TABLET | ORAL | 2 refills | Status: DC
Start: 2019-05-26 — End: 2019-06-25

## 2019-05-26 MED ORDER — CEPHALEXIN 500 MG CAP
500 mg | ORAL_CAPSULE | ORAL | 2 refills | Status: DC
Start: 2019-05-26 — End: 2019-06-25

## 2019-06-03 NOTE — Telephone Encounter (Signed)
Should be information on the Internet

## 2019-06-03 NOTE — Telephone Encounter (Signed)
Patient called wanting to get a medical marijuana card. I advised him Dr. Madelyn Brunner does not do that. Wants to know how he can go about getting one.

## 2019-06-03 NOTE — Telephone Encounter (Signed)
Please advise.

## 2019-06-04 NOTE — Telephone Encounter (Signed)
Patient aware he will have to look on the Internet for information on how to get a medical marijuana card.  Patient states he looked yesterday and said his doctor would have to write a note.  Patient was advised Dr. Madelyn Brunner does not write notes for medical marijuana cards and he will have to look on the Internet for information regarding this. Patient verbalizes understanding.

## 2019-06-25 MED ORDER — CEPHALEXIN 500 MG CAP
500 mg | ORAL_CAPSULE | ORAL | 2 refills | Status: DC
Start: 2019-06-25 — End: 2019-07-24

## 2019-06-25 MED ORDER — TIZANIDINE 4 MG TAB
4 mg | ORAL_TABLET | ORAL | 2 refills | Status: DC
Start: 2019-06-25 — End: 2019-07-24

## 2019-07-24 ENCOUNTER — Encounter

## 2019-07-24 MED ORDER — OMEPRAZOLE 40 MG CAP, DELAYED RELEASE
40 mg | ORAL_CAPSULE | Freq: Every day | ORAL | 1 refills | Status: DC
Start: 2019-07-24 — End: 2019-08-30

## 2019-07-24 MED ORDER — TIZANIDINE 4 MG TAB
4 mg | ORAL_TABLET | Freq: Four times a day (QID) | ORAL | 2 refills | Status: DC | PRN
Start: 2019-07-24 — End: 2019-09-20

## 2019-07-24 MED ORDER — CEPHALEXIN 500 MG CAP
500 mg | ORAL_CAPSULE | Freq: Every day | ORAL | 1 refills | Status: DC
Start: 2019-07-24 — End: 2020-01-01

## 2019-07-24 MED ORDER — IBUPROFEN 800 MG TAB
800 mg | ORAL_TABLET | Freq: Three times a day (TID) | ORAL | 4 refills | Status: DC | PRN
Start: 2019-07-24 — End: 2019-07-31

## 2019-07-24 NOTE — Telephone Encounter (Signed)
Previous Rx's were sent to [facility]    Last Visit: 03/04/19 with MD Hunte  Next Appointment: 09/02/19 with MD Hunte  Previous Refill Encounter(s): 06/25/19 Zanaflex #120 with 2 refills & Keflex #30 with 2 refills, 03/04/19 Motrin #90 with 4 refills & Prilosec #90 with 1 refill    Requested Prescriptions     Pending Prescriptions Disp Refills   ??? tiZANidine (ZANAFLEX) 4 mg tablet 120 Tab 2     Sig: Take 1 Tab by mouth every six (6) hours as needed for Muscle Spasm(s).   ??? ibuprofen (MOTRIN) 800 mg tablet 90 Tab 4     Sig: Take 1 Tab by mouth every eight (8) hours as needed for Pain.   ??? cephALEXin (KEFLEX) 500 mg capsule 90 Cap 1     Sig: Take 1 Cap by mouth daily.   ??? omeprazole (PRILOSEC) 40 mg capsule 90 Cap 1     Sig: Take 1 Cap by mouth daily.

## 2019-07-24 NOTE — Telephone Encounter (Signed)
Pt calling to transfer his meds to [facility] on [address] as new pharmacy.

## 2019-07-31 MED ORDER — IBUPROFEN 800 MG TAB
800 mg | ORAL_TABLET | ORAL | 4 refills | Status: DC
Start: 2019-07-31 — End: 2019-09-20

## 2019-08-14 ENCOUNTER — Encounter

## 2019-08-23 ENCOUNTER — Inpatient Hospital Stay: Admit: 2019-08-23 | Payer: MEDICARE | Attending: Physician Assistant | Primary: Internal Medicine

## 2019-08-23 DIAGNOSIS — C8449 Peripheral T-cell lymphoma, not classified, extranodal and solid organ sites: Secondary | ICD-10-CM

## 2019-08-23 LAB — CREATININE, POC
Creatinine, POC: 1.3 MG/DL (ref 0.6–1.3)
GFRAA, POC: >60 mL/min/{1.73_m2} (ref >60)
GFRNA, POC: 59 mL/min/{1.73_m2} — ABNORMAL LOW (ref >60)

## 2019-08-23 LAB — AMB POC CREATININE
Creatinine, POC: 1.3 MG/DL (ref 0.6–1.3)
GFR African American: >60 mL/min/{1.73_m2} (ref >60)
GFR Non-African American: 59 mL/min/{1.73_m2} — ABNORMAL LOW (ref >60)

## 2019-08-23 MED ORDER — IOPAMIDOL 61 % IV SOLN
61 % | Freq: Once | INTRAVENOUS | Status: AC
Start: 2019-08-23 — End: 2019-08-23
  Administered 2019-08-23: 13:00:00 via INTRAVENOUS

## 2019-08-23 MED FILL — ISOVUE-300  61 % INTRAVENOUS SOLUTION: 300 mg iodine /mL (61 %) | INTRAVENOUS | Qty: 100

## 2019-08-30 ENCOUNTER — Encounter

## 2019-08-30 MED ORDER — OMEPRAZOLE 40 MG CAP, DELAYED RELEASE
40 mg | ORAL_CAPSULE | ORAL | 1 refills | Status: DC
Start: 2019-08-30 — End: 2020-01-01

## 2019-09-02 ENCOUNTER — Encounter: Attending: Internal Medicine | Primary: Internal Medicine

## 2019-09-20 ENCOUNTER — Ambulatory Visit: Attending: Internal Medicine | Primary: Internal Medicine

## 2019-09-20 ENCOUNTER — Ambulatory Visit: Admit: 2019-09-20 | Discharge: 2019-09-20 | Payer: MEDICARE | Attending: Internal Medicine | Primary: Internal Medicine

## 2019-09-20 DIAGNOSIS — M5442 Lumbago with sciatica, left side: Secondary | ICD-10-CM

## 2019-09-20 DIAGNOSIS — G8929 Other chronic pain: Secondary | ICD-10-CM

## 2019-09-20 MED ORDER — TIZANIDINE 4 MG TAB
4 mg | ORAL_TABLET | Freq: Three times a day (TID) | ORAL | 1 refills | Status: DC | PRN
Start: 2019-09-20 — End: 2020-01-01

## 2019-09-20 MED ORDER — IBUPROFEN 800 MG TAB
800 mg | ORAL_TABLET | Freq: Three times a day (TID) | ORAL | 1 refills | Status: DC | PRN
Start: 2019-09-20 — End: 2020-01-01

## 2019-09-20 NOTE — Progress Notes (Signed)
Patient is in the office today for a 6 month follow up.        1. Have you been to the ER, urgent care clinic since your last visit?  Hospitalized since your last visit?No    2. Have you seen or consulted any other health care providers outside of the [facility] since your last visit?  Include any pap smears or colon screening. Yes, Dr. Carm[facility].

## 2019-09-20 NOTE — Progress Notes (Signed)
Dean Walker is a 49 y.o.  male and presents with Back Pain (chronic ), Non - Hodgkin's Disease (f/u), Skin Problem, and Lymphoma      SUBJECTIVE:  Osteoarthritis and Chronic Pain:  Patient has osteoarthritis, primarily affecting the back.   Symptoms onset: problem is longstanding.  Rheumatological ROS: using Motrin and Zanaflex on a regular basis for pain control.    Response to treatment plan: pt had ETOH and marijuana in UDS so stopped  Norco and will not prescribe narcotic meds due high risk of abuse.  Patient continues to use marijuana for pain control.    Pt's GERD is well controlled Prilosec     Pt has h/o recurrent boils in groin area and uses Keflex 500 mg daily for prophylaxis especially in light of recent treatment for NHL.  Mr. Kadrmas first noticed a cough around April/2019 and had a 20lb weight loss around the same time due to decreased appetite. He presented to the ED on 08/09/17 for worsening SOB and a CTA chest was performed and revealed innumerable pulmonary nodules highly concerning for metastatic disease. 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 Bone marrow biopsy was negative. He completed 6 cycles of Adcetris, Doxorubicin, Cytoxan, and Prednisone without incident in October/2019. Post treatment PET scan in November/2019 revealed complete remission. He was referred to [facility] for consideration of autologous stem cell transplant but since he was in remission plan was to monitor q 3 months for now for 2 years.  Patient received his treatment in [place] but is now back in [place] and is seeing Dr Meryl Crutch.    Pt's BP has been elevated in the past when he was drinking ETOH.  It is currently well controlled without medications.     Respiratory ROS: negative for - shortness of breath  Cardiovascular ROS: negative for - chest pain    Current Outpatient Medications   Medication Sig   ??? ibuprofen (MOTRIN) 800 mg  tablet Take 1 Tablet by mouth every eight (8) hours as needed for Pain.   ??? tiZANidine (ZANAFLEX) 4 mg tablet Take 1 Tablet by mouth three (3) times daily as needed for Muscle Spasm(s).   ??? omeprazole (PRILOSEC) 40 mg capsule TAKE 1 CAPSULE BY MOUTH DAILY   ??? cephALEXin (KEFLEX) 500 mg capsule Take 1 Cap by mouth daily.   ??? multivitamin capsule Take 1 Cap by mouth daily.     No current facility-administered medications for this visit.         OBJECTIVE:  alert, well appearing, and in no distress  Visit Vitals  BP 112/72 (BP 1 Location: Left arm, BP Patient Position: Sitting, BP Cuff Size: Adult)   Pulse 90   Temp 97.4 ??F (36.3 ??C) (Temporal)   Resp 16   Ht 5' 6"  (1.676 m)   Wt 151 lb (68.5 kg)   SpO2 99%   BMI 24.37 kg/m??      Chest - clear to auscultation, no wheezes, rales or rhonchi, symmetric air entry  Heart - normal rate, regular rhythm, normal S1, S2, no murmurs, rubs, clicks or gallops  Extremities - peripheral pulses normal, no pedal edema, no clubbing or cyanosis    Labs:   Lab Results   Component Value Date/Time    WBC 10.0 03/04/2019 11:20 AM    HGB 14.9 03/04/2019 11:20 AM    HCT 42.8 03/04/2019 11:20 AM    PLATELET 268 03/04/2019 11:20  AM    MCV 101.7 (H) 03/04/2019 11:20 AM     Lab Results   Component Value Date/Time    Cholesterol, total 157 01/08/2016 02:19 PM    HDL Cholesterol 36 (L) 01/08/2016 02:19 PM    LDL, calculated 80.4 01/08/2016 02:19 PM    Triglyceride 203 (H) 01/08/2016 02:19 PM    CHOL/HDL Ratio 4.4 01/08/2016 02:19 PM     Lab Results   Component Value Date/Time    Sodium 137 03/04/2019 11:20 AM    Potassium 4.2 03/04/2019 11:20 AM    Chloride 108 03/04/2019 11:20 AM    CO2 22 03/04/2019 11:20 AM    Anion gap 7 03/04/2019 11:20 AM    Glucose 87 03/04/2019 11:20 AM    BUN 19 (H) 03/04/2019 11:20 AM    Creatinine 1.18 03/04/2019 11:20 AM    BUN/Creatinine ratio 16 03/04/2019 11:20 AM    GFR est AA >60 03/04/2019 11:20 AM    GFR est non-AA >60 03/04/2019 11:20 AM    Calcium 9.4 03/04/2019  11:20 AM    Bilirubin, total 0.2 03/04/2019 11:20 AM    ALT (SGPT) 27 03/04/2019 11:20 AM    Alk. phosphatase 81 03/04/2019 11:20 AM    Protein, total 7.1 03/04/2019 11:20 AM    Albumin 4.1 03/04/2019 11:20 AM    Globulin 3.0 03/04/2019 11:20 AM    A-G Ratio 1.4 03/04/2019 11:20 AM        Labs:   Lab Results   Component Value Date/Time    Prostate Specific Ag 0.8 03/04/2019 11:20 AM    Prostate Specific Ag 0.8 01/23/2017 10:04 AM    Prostate Specific Ag 0.8 01/08/2016 02:19 PM               Assessment/Plan      ICD-10-CM ICD-9-CM    1. Chronic midline low back pain with left-sided sciatica  M54.42 724.2  fairly well-controlled on Motrin and Zanaflex.  Patient pursuing medical marijuana.    G89.29 724.3      338.29    2. Recurrent boils  L02.93 680.9  will continue on Keflex for now.   3. Gastroesophageal reflux disease without esophagitis  K21.9 530.81  controlled on Prilosec 40 mg daily   4. 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 Return in about 6 months (around 03/21/2020).         Reviewed plan of care. Patient has provided input and agrees with goals.
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
## 2019-10-14 IMAGING — DX DG CHEST 1V
1 series · 1 of 1 positions shown · non-contrast
Comparison: 08/09/2017 and chest CT 08/09/2017

CLINICAL DATA: Leukocytosis.

EXAM:
CHEST  1 VIEW

[chest]
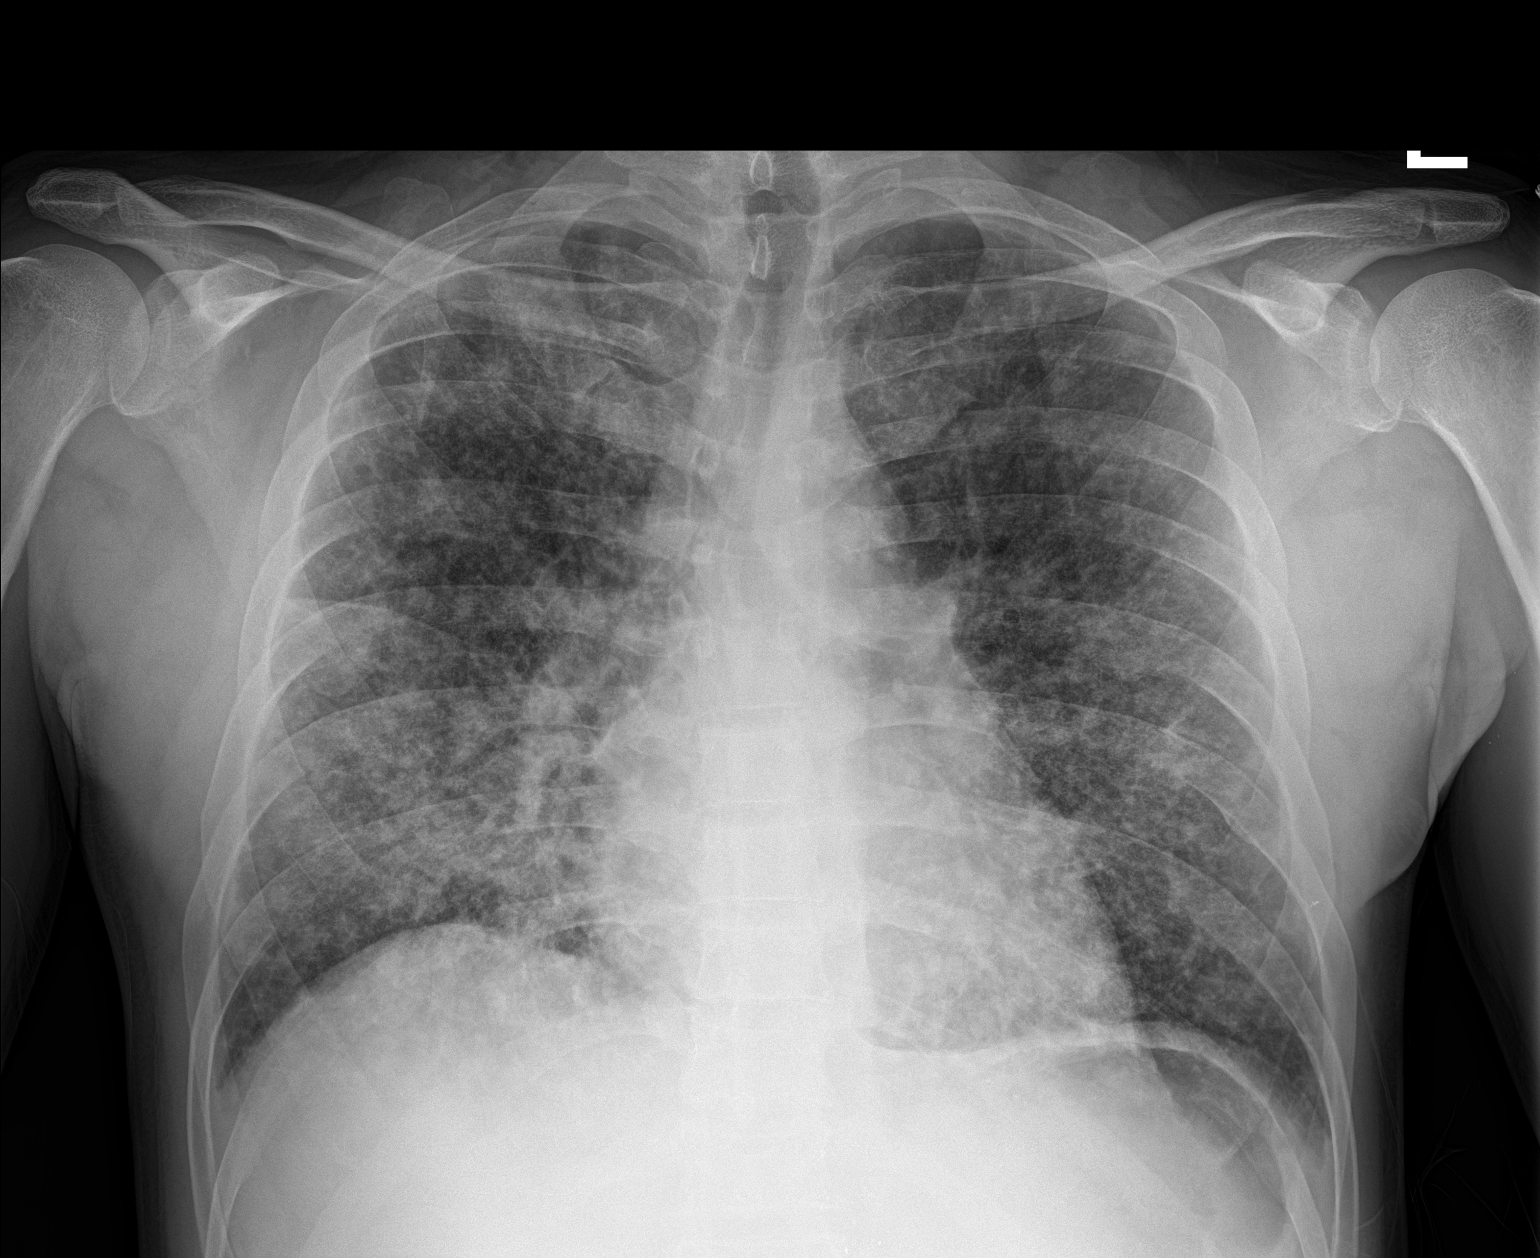

[1 of 1 positions shown; findings below may reference images not displayed]

FINDINGS: Lungs are adequately inflated demonstrate persistent bilateral
nodular airspace process as demonstrated on recent CT scan as this
appears slightly more confluent in certain areas particularly over
the right mid to lower lung. No evidence of effusion.
Cardiomediastinal silhouette and remainder the exam is unchanged.
IMPRESSION: Known diffuse bilateral nodular process as seen on recent CT scan
which appears more confluent over the right mid to lower lung. In
keeping with patient's history of leukocytosis, infectious or
atypical infectious etiology would be favored and less likely and
inflammatory or metastatic process.

## 2019-10-18 IMAGING — CR DG CHEST 1V PORT
1 series · 1 of 1 positions shown · non-contrast
Comparison: Single-view of the chest 08/13/2017. CT chest
08/09/2017.

CLINICAL DATA: Patient with innumerable pulmonary nodules on prior
examinations. Status post bronchoscopy today.

EXAM:
PORTABLE CHEST 1 VIEW

[AP]
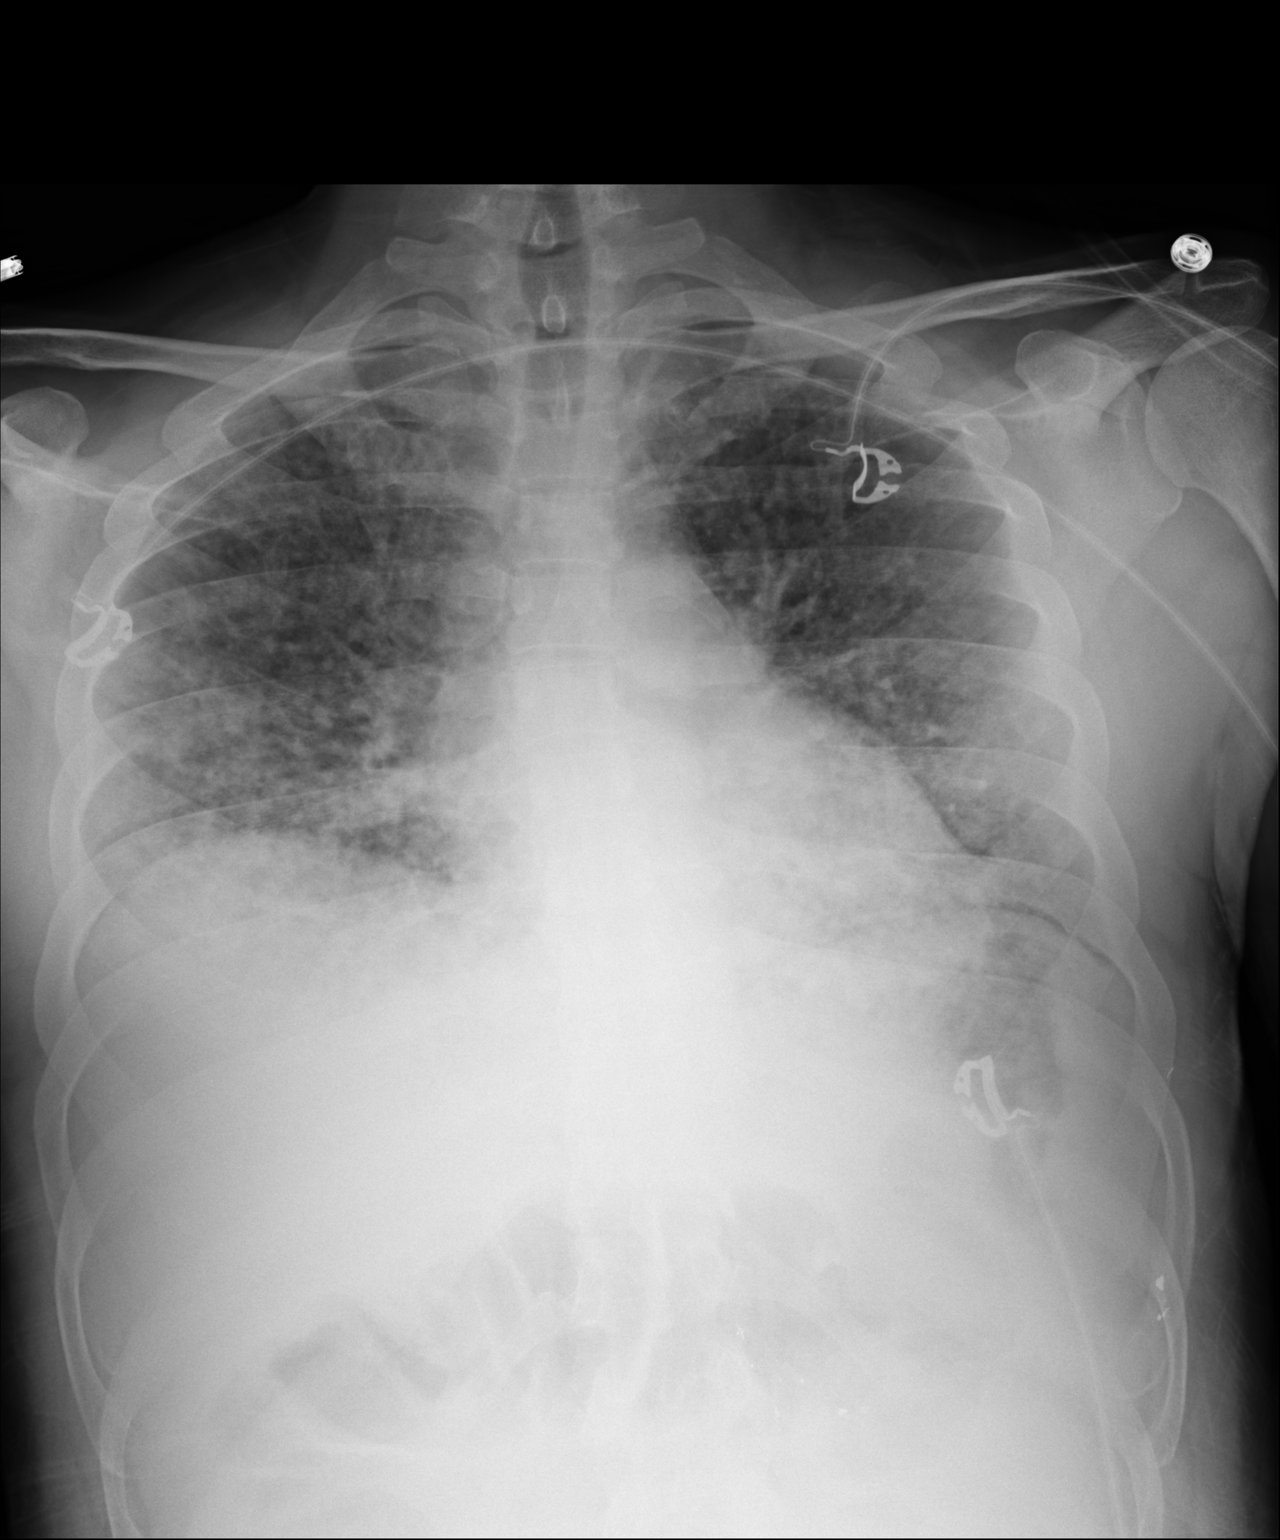

[1 of 1 positions shown; findings below may reference images not displayed]

FINDINGS: There is no pneumothorax. Lung volumes are low. Innumerable
bilateral pulmonary nodules are seen. Heart size is normal.
IMPRESSION: Negative for pneumothorax after bronchoscopy.

No change in innumerable bilateral pulmonary nodules.

## 2019-11-04 ENCOUNTER — Encounter

## 2019-11-14 ENCOUNTER — Inpatient Hospital Stay: Admit: 2019-11-14 | Payer: MEDICARE | Attending: Physician Assistant | Primary: Internal Medicine

## 2019-11-14 DIAGNOSIS — C8449 Peripheral T-cell lymphoma, not classified, extranodal and solid organ sites: Secondary | ICD-10-CM

## 2019-11-14 LAB — CREATININE, POC
Creatinine, POC: 1.3 MG/DL (ref 0.6–1.3)
GFRAA, POC: >60 mL/min/{1.73_m2} (ref >60)
GFRNA, POC: 59 mL/min/{1.73_m2} — ABNORMAL LOW (ref >60)

## 2019-11-14 LAB — AMB POC CREATININE
Creatinine, POC: 1.3 MG/DL (ref 0.6–1.3)
GFR African American: >60 mL/min/{1.73_m2} (ref >60)
GFR Non-African American: 59 mL/min/{1.73_m2} — ABNORMAL LOW (ref >60)

## 2019-11-14 MED ORDER — IOPAMIDOL 61 % IV SOLN
61 % | Freq: Once | INTRAVENOUS | Status: AC
Start: 2019-11-14 — End: 2019-11-14
  Administered 2019-11-14: 13:00:00 via INTRAVENOUS

## 2019-11-14 MED FILL — ISOVUE-300  61 % INTRAVENOUS SOLUTION: 300 mg iodine /mL (61 %) | INTRAVENOUS | Qty: 100

## 2020-01-01 ENCOUNTER — Encounter

## 2020-01-01 MED ORDER — IBUPROFEN 800 MG TAB
800 mg | ORAL_TABLET | Freq: Three times a day (TID) | ORAL | 1 refills | Status: DC | PRN
Start: 2020-01-01 — End: 2020-09-11

## 2020-01-01 MED ORDER — TIZANIDINE 4 MG TAB
4 mg | ORAL_TABLET | Freq: Three times a day (TID) | ORAL | 1 refills | Status: DC | PRN
Start: 2020-01-01 — End: 2020-05-26

## 2020-01-01 MED ORDER — OMEPRAZOLE 40 MG CAP, DELAYED RELEASE
40 mg | ORAL_CAPSULE | Freq: Every day | ORAL | 1 refills | Status: DC
Start: 2020-01-01 — End: 2020-09-11

## 2020-01-01 MED ORDER — CEPHALEXIN 500 MG CAP
500 mg | ORAL_CAPSULE | Freq: Every day | ORAL | 1 refills | Status: DC
Start: 2020-01-01 — End: 2020-03-13

## 2020-01-01 NOTE — Telephone Encounter (Signed)
 Last Visit: 09/20/19 with MD Hunte  Next Appointment: 03/13/20 with MD Hunte  Previous Refill Encounter(s): 08/30/19 Prilosec #90 with 1 refill, 09/20/19 Zanaflex  & Motrin  #270 with 1 refill, 07/24/19 Keflex #90 with 1 refill    Requested Prescriptions     Pending Prescriptions Disp Refills   . omeprazole  (PRILOSEC) 40 mg capsule 90 Capsule 1     Sig: Take 1 Capsule by mouth daily.   . tiZANidine  (ZANAFLEX ) 4 mg tablet 270 Tablet 1     Sig: Take 1 Tablet by mouth three (3) times daily as needed for Muscle Spasm(s).   . ibuprofen  (MOTRIN ) 800 mg tablet 270 Tablet 1     Sig: Take 1 Tablet by mouth every eight (8) hours as needed for Pain.   . cephALEXin (KEFLEX) 500 mg capsule 90 Capsule 1     Sig: Take 1 Capsule by mouth daily.

## 2020-01-01 NOTE — Telephone Encounter (Signed)
Pt asking for 90 days

## 2020-02-26 ENCOUNTER — Encounter

## 2020-03-13 ENCOUNTER — Ambulatory Visit: Attending: Internal Medicine | Primary: Internal Medicine

## 2020-03-13 ENCOUNTER — Inpatient Hospital Stay: Admit: 2020-03-13 | Payer: MEDICARE | Primary: Internal Medicine

## 2020-03-13 ENCOUNTER — Ambulatory Visit: Admit: 2020-03-13 | Discharge: 2020-03-13 | Payer: MEDICARE | Attending: Internal Medicine | Primary: Internal Medicine

## 2020-03-13 DIAGNOSIS — Z0000 Encounter for general adult medical examination without abnormal findings: Secondary | ICD-10-CM

## 2020-03-13 DIAGNOSIS — Z125 Encounter for screening for malignant neoplasm of prostate: Secondary | ICD-10-CM

## 2020-03-13 LAB — PSA SCREENING (SCREENING): Prostate Specific Ag: 1.1 ng/mL (ref 0.0–4.0)

## 2020-03-13 LAB — PSA SCREENING: Pros. Spec. Antigen: 1.1 ng/mL (ref 0.0–4.0)

## 2020-03-13 NOTE — Progress Notes (Signed)
Dean Walker is a 49 y.o.  male and presents with Back Pain (Chronic), GERD (f/u ), and Lymphoma      SUBJECTIVE:  Osteoarthritis and Chronic Pain:  Patient has osteoarthritis, primarily affecting the back.   Symptoms onset: problem is longstanding.  Rheumatological ROS: using Motrin and Zanaflex on a regular basis for pain control.    Response to treatment plan: pt had ETOH and marijuana in UDS so stopped  Norco and will not prescribe narcotic meds due high risk of abuse.  Patient continues to use marijuana for pain control.    Pt's GERD is well controlled Prilosec     Pt has h/o recurrent boils in groin area and was using Keflex 500 mg daily for prophylaxis for several years after  treatment for NHL.  Dean Walker first noticed a cough around April/2019 and had a 20lb weight loss around the same time due to decreased appetite. He presented to the ED on 08/09/17 for worsening SOB and a CTA chest was performed and revealed innumerable pulmonary nodules highly concerning for metastatic disease. 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 Bone marrow biopsy was negative. He completed 6 cycles of Adcetris, Doxorubicin, Cytoxan, and Prednisone without incident in October/2019. Post treatment PET scan in November/2019 revealed complete remission. He was referred to [facility] for consideration of autologous stem cell transplant but since he was in remission plan was to monitor q 3 months for now for 2 years.  Patient received his treatment in [place] but is now back in [place] and is seeing Dr Meryl Crutch.        Respiratory ROS: negative for - shortness of breath  Cardiovascular ROS: negative for - chest pain    Current Outpatient Medications   Medication Sig   ??? omeprazole (PRILOSEC) 40 mg capsule Take 1 Capsule by mouth daily.   ??? tiZANidine (ZANAFLEX) 4 mg tablet Take 1 Tablet by mouth three (3) times daily as needed for Muscle Spasm(s).    ??? ibuprofen (MOTRIN) 800 mg tablet Take 1 Tablet by mouth every eight (8) hours as needed for Pain.   ??? multivitamin capsule Take 1 Cap by mouth daily.     No current facility-administered medications for this visit.         OBJECTIVE:  alert, well appearing, and in no distress  Visit Vitals  BP 109/71 (BP 1 Location: Left arm, BP Patient Position: Sitting, BP Cuff Size: Adult)   Pulse 97   Temp 97.8 ??F (36.6 ??C) (Temporal)   Resp 16   Ht 5' 6"  (1.676 m)   Wt 165 lb (74.8 kg)   SpO2 100%   BMI 26.63 kg/m??      Chest - clear to auscultation, no wheezes, rales or rhonchi, symmetric air entry  Heart - normal rate, regular rhythm, normal S1, S2, no murmurs, rubs, clicks or gallops  Extremities - peripheral pulses normal, no pedal edema, no clubbing or cyanosis    Labs:   Lab Results   Component Value Date/Time    WBC 10.0 03/04/2019 11:20 AM    HGB 14.9 03/04/2019 11:20 AM    HCT 42.8 03/04/2019 11:20 AM    PLATELET 268 03/04/2019 11:20 AM    MCV 101.7 (H) 03/04/2019 11:20 AM     Lab Results   Component Value Date/Time    Cholesterol, total 157 01/08/2016 02:19 PM    HDL Cholesterol 36 (L) 01/08/2016 02:19  PM    LDL, calculated 80.4 01/08/2016 02:19 PM    Triglyceride 203 (H) 01/08/2016 02:19 PM    CHOL/HDL Ratio 4.4 01/08/2016 02:19 PM     Lab Results   Component Value Date/Time    Sodium 137 03/04/2019 11:20 AM    Potassium 4.2 03/04/2019 11:20 AM    Chloride 108 03/04/2019 11:20 AM    CO2 22 03/04/2019 11:20 AM    Anion gap 7 03/04/2019 11:20 AM    Glucose 87 03/04/2019 11:20 AM    BUN 19 (H) 03/04/2019 11:20 AM    Creatinine 1.18 03/04/2019 11:20 AM    BUN/Creatinine ratio 16 03/04/2019 11:20 AM    GFR est AA >60 03/04/2019 11:20 AM    GFR est non-AA >60 03/04/2019 11:20 AM    Calcium 9.4 03/04/2019 11:20 AM    Bilirubin, total 0.2 03/04/2019 11:20 AM    ALT (SGPT) 27 03/04/2019 11:20 AM    Alk. phosphatase 81 03/04/2019 11:20 AM    Protein, total 7.1 03/04/2019 11:20 AM    Albumin 4.1 03/04/2019 11:20 AM     Globulin 3.0 03/04/2019 11:20 AM    A-G Ratio 1.4 03/04/2019 11:20 AM        Labs:   Lab Results   Component Value Date/Time    Prostate Specific Ag 1.1 03/13/2020 11:50 AM    Prostate Specific Ag 0.8 03/04/2019 11:20 AM    Prostate Specific Ag 0.8 01/23/2017 10:04 AM               Assessment/Plan      ICD-10-CM ICD-9-CM    1. Medicare annual wellness visit, subsequent  Z00.00 V70.0    2. Non-Hodgkin's lymphoma, unspecified body region, unspecified non-Hodgkin lymphoma type ([place])  C85.90 202.80 Pt in Remission and is being followed closely by oncology.    3. Chronic midline low back pain with left-sided sciatica  M54.42 724.2 Stable on Motrin and Zanaflex     G89.29 724.3      338.29    4. Gastroesophageal reflux disease without esophagitis  K21.9 530.81 Well controlled on Prilosec    5. Prostate cancer screening  Z12.5 V76.44 PSA SCREENING (SCREENING)   6. Colon cancer screening  Z12.11 V76.51 COLOGUARD TEST (FECAL DNA COLORECTAL CANCER SCREENING)       Follow-up and Dispositions    ?? Return in about 6 months (around 09/11/2020).         Reviewed plan of care. Patient has provided input and agrees with goals.

## 2020-03-13 NOTE — Progress Notes (Signed)
Tell patient his/her PSA is normal

## 2020-03-13 NOTE — Progress Notes (Signed)
Patient is in the office today for a 6 month follow up.    1. "Have you been to the ER, urgent care clinic since your last visit?  Hospitalized since your last visit?" No    2. "Have you seen or consulted any other health care providers outside of the [facility] since your last visit?" yes, Dr. Retta Mac oncology.    3. For patients aged 50-75: Has the patient had a colonoscopy / FIT/ Cologuard? No     If the patient is male:    4. For patients aged 87-74: Has the patient had a mammogram within the past 2 years? NA based on age or sex    55. For patients aged 21-65: Has the patient had a pap smear? NA based on age or sex

## 2020-03-13 NOTE — Progress Notes (Signed)
Patient is aware PSA is normal.  Patient verbalizes understanding.

## 2020-03-13 NOTE — Progress Notes (Signed)
This is the Subsequent Medicare Annual Wellness Exam, performed 12 months or more after the Initial AWV or the last Subsequent AWV    I have reviewed the patient's medical history in detail and updated the computerized patient record.       Assessment/Plan   Education and counseling provided:  Are appropriate based on today's review and evaluation  End-of-Life planning (with patient's consent)    1. Medicare annual wellness visit, subsequent  2. Non-Hodgkin's lymphoma, unspecified body region, unspecified non-Hodgkin lymphoma type ([place])  3. Chronic midline low back pain with left-sided sciatica  4. Gastroesophageal reflux disease without esophagitis  5. Prostate cancer screening  -     PSA SCREENING (SCREENING); Future  6. Colon cancer screening  -     COLOGUARD TEST (FECAL DNA COLORECTAL CANCER SCREENING)       Depression Risk Factor Screening     3 most recent PHQ Screens 03/13/2020   Little interest or pleasure in doing things Not at all   Feeling down, depressed, irritable, or hopeless Not at all   Total Score PHQ 2 0       Alcohol Risk Screen    Do you average more than 2 drinks per night or 14 drinks a week: No    On any one occasion in the past three months have you have had more than 4 drinks containing alcohol:  No        Functional Ability and Level of Safety    Hearing: Hearing is good.      Activities of Daily Living:  The home contains: no safety equipment.  Patient does total self care      Ambulation: with no difficulty     Fall Risk:  No flowsheet data found.   Abuse Screen:  Patient is not abused       Cognitive Screening    Has your family/caregiver stated any concerns about your memory: no     Cognitive Screening: Normal - .    Health Maintenance Due     Health Maintenance Due   Topic Date Due   ??? Hepatitis C Screening  Never done   ??? COVID-19 Vaccine (1) Never done   ??? DTaP/Tdap/Td series (1 - Tdap) Never done   ??? Colorectal Cancer Screening Combo  Never done   ??? Pneumococcal 0-64 years (2 of 4  - PCV13) 08/16/2018   ??? Flu Vaccine (1) 11/27/2019   ??? Shingrix Vaccine Age 50> (1 of 2) Never done       Patient Care Team   Patient Care Team:  Thana Farr, MD as PCP - General (Internal Medicine)  Thana Farr, MD as PCP - [facility] Empaneled Provider  Damle, Dellia Beckwith, MD (Hematology and Oncology)  Glaucoma Screening- Pt encouraged to f/u   Pneumonia Vaccine-????Due at age 23??  Shingles Vaccine-??Pt aware of SHingrinx  Tdap Vaccine-??not UTD??  Colonoscopy-????Cologuard ordered??  PSA?? 02/2020  1.1  Advance Directive-????Pt given information    History     Patient Active Problem List   Diagnosis Code   ??? Migraine headache G43.909     Past Medical History:   Diagnosis Date   ??? Hypercholesterolemia    ??? Migraine headache       History reviewed. No pertinent surgical history.  Current Outpatient Medications   Medication Sig Dispense Refill   ??? omeprazole (PRILOSEC) 40 mg capsule Take 1 Capsule by mouth daily. 90 Capsule 1   ??? tiZANidine (ZANAFLEX) 4 mg tablet Take  1 Tablet by mouth three (3) times daily as needed for Muscle Spasm(s). 270 Tablet 1   ??? ibuprofen (MOTRIN) 800 mg tablet Take 1 Tablet by mouth every eight (8) hours as needed for Pain. 270 Tablet 1   ??? multivitamin capsule Take 1 Cap by mouth daily.       Allergies   Allergen Reactions   ??? Tramadol Nausea Only     SEVERE NAUSEA  SEVERE NAUSEA     ??? Gabapentin Other (comments)     Caused foot pain       Family History   Problem Relation Age of Onset   ??? Diabetes Mother    ??? Hypertension Father      Social History     Tobacco Use   ??? Smoking status: Former Smoker     Packs/day: 0.50     Years: 20.00     Pack years: 10.00     Types: Cigarettes     Quit date: 07/04/2017     Years since quitting: 2.7   ??? Smokeless tobacco: Never Used   Substance Use Topics   ??? Alcohol use: Yes     Alcohol/week: 12.0 standard drinks     Types: 12 Cans of beer per week     Comment: 12 pk a week     A comprehensive 5 year plan for medical care and screening exams was reviewed with pt and they  received a copy of it.        Thana Farr, MD

## 2020-03-23 ENCOUNTER — Inpatient Hospital Stay: Admit: 2020-03-23 | Payer: MEDICARE | Attending: Physician Assistant | Primary: Internal Medicine

## 2020-03-23 DIAGNOSIS — C8449 Peripheral T-cell lymphoma, not classified, extranodal and solid organ sites: Secondary | ICD-10-CM

## 2020-03-23 LAB — CREATININE, POC
Creatinine, POC: 1.2 MG/DL (ref 0.6–1.3)
GFRAA, POC: >60 mL/min/{1.73_m2} (ref >60)
GFRNA, POC: >60 mL/min/{1.73_m2} (ref >60)

## 2020-03-23 LAB — AMB POC CREATININE
Creatinine, POC: 1.2 MG/DL (ref 0.6–1.3)
GFR African American: >60 mL/min/{1.73_m2} (ref >60)
GFR Non-African American: >60 mL/min/{1.73_m2} (ref >60)

## 2020-03-23 MED ORDER — IOPAMIDOL 61 % IV SOLN
300 mg iodine /mL (61 %) | Freq: Once | INTRAVENOUS | Status: AC
Start: 2020-03-23 — End: 2020-03-23
  Administered 2020-03-23: 14:00:00 via INTRAVENOUS

## 2020-03-23 MED FILL — ISOVUE-300  61 % INTRAVENOUS SOLUTION: 300 mg iodine /mL (61 %) | INTRAVENOUS | Qty: 100

## 2020-04-15 IMAGING — US US ABDOMEN LIMITED
1 series · 14 of 25 positions shown · non-contrast
Comparison: None.

CLINICAL DATA: Abnormal LFTs

EXAM:
ULTRASOUND ABDOMEN LIMITED RIGHT UPPER QUADRANT

[Series 1: us abdomen limited · 0.22mm/px · 14 of 35 slices shown]
[im 1/35]
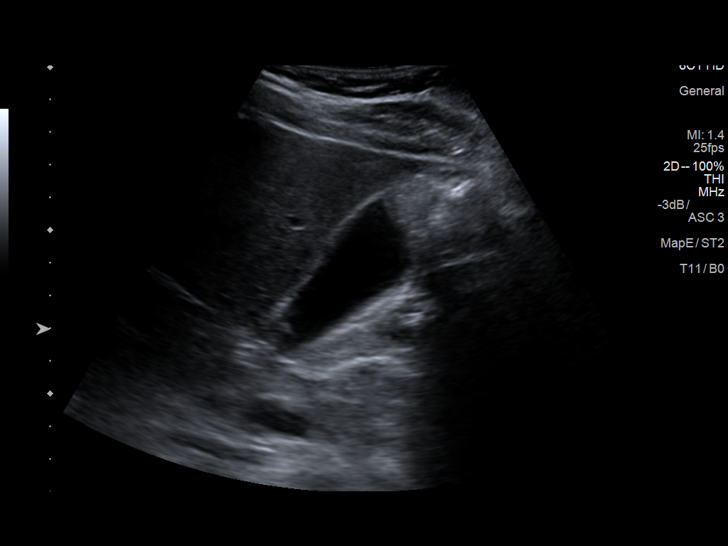
[im 3/35]
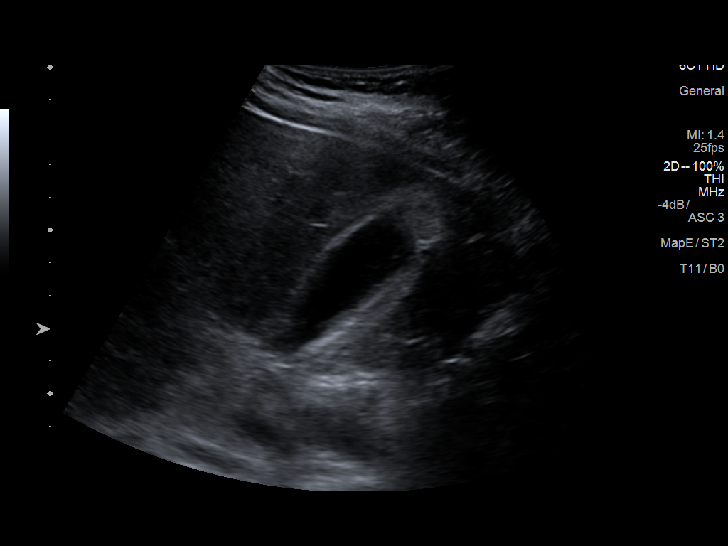
[im 6/35]
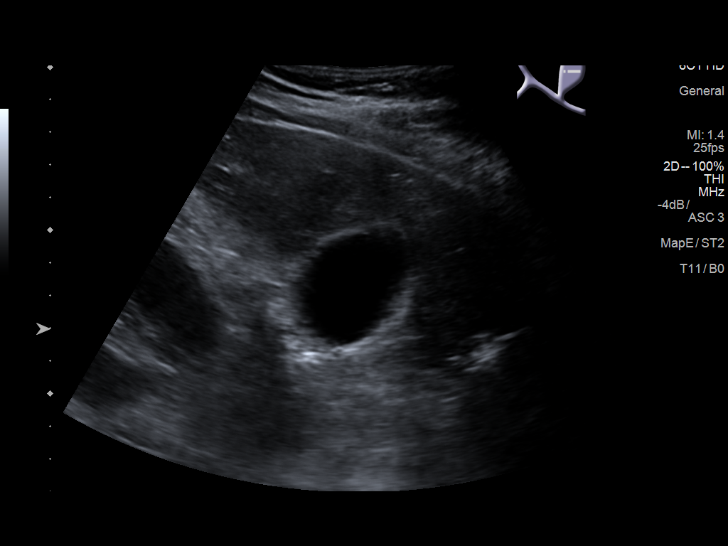
[im 9/35]
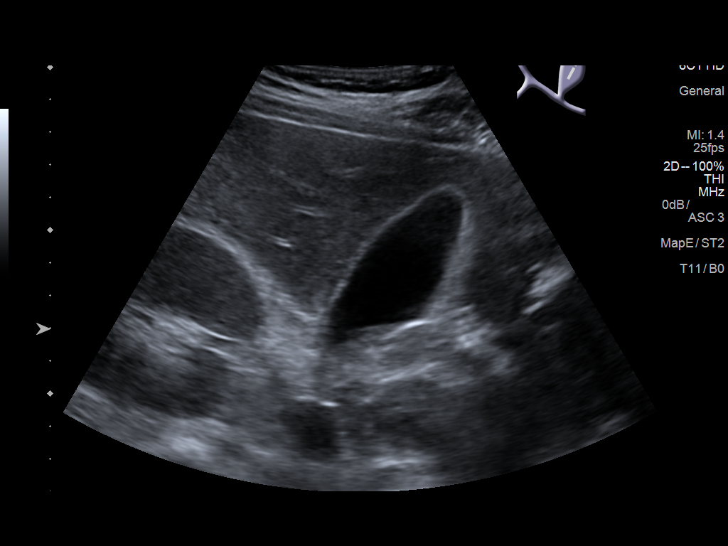
[im 12/35]
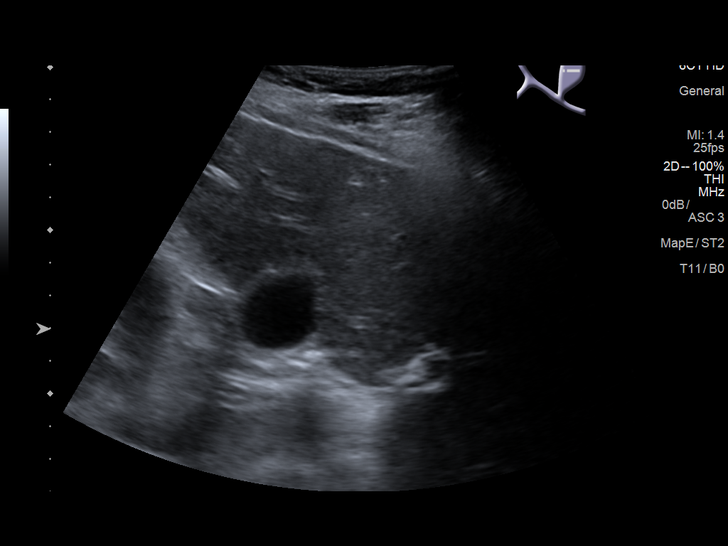
[im 13/35]
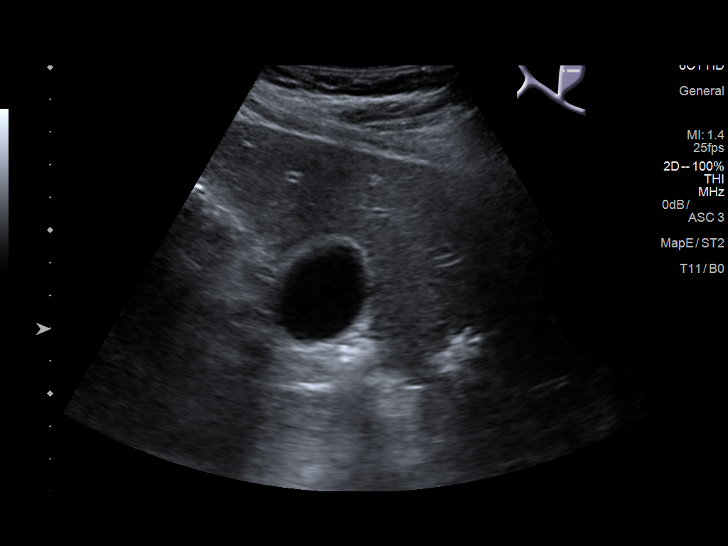
[im 16/35]
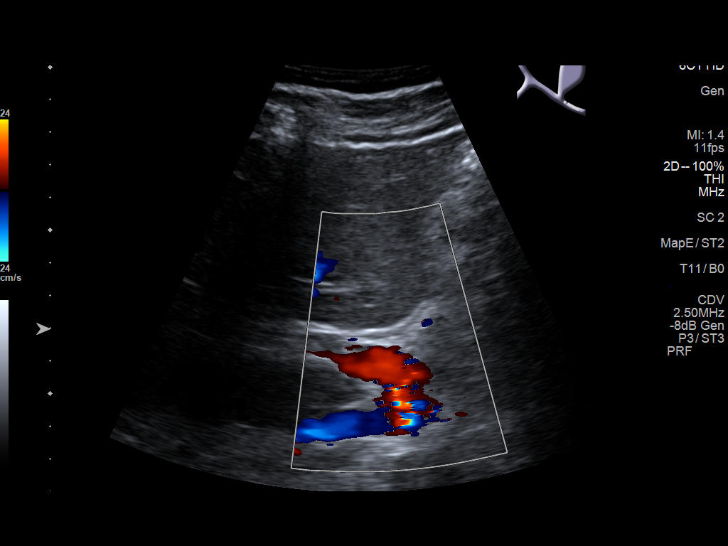
[im 19/35]
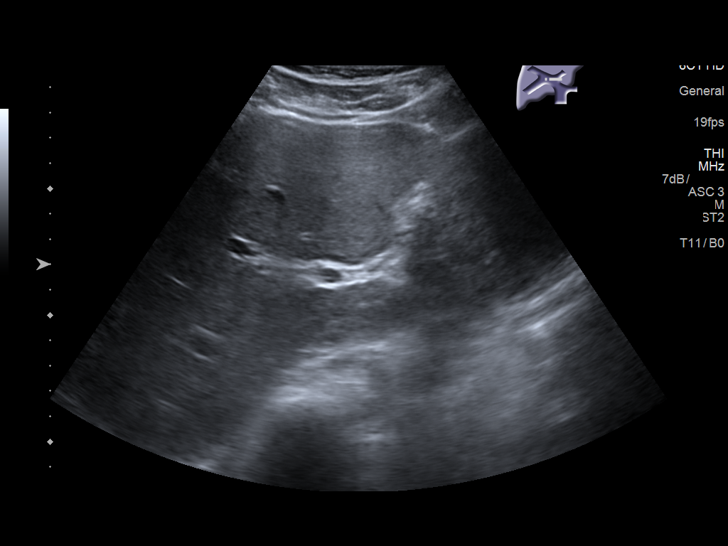
[im 22/35]
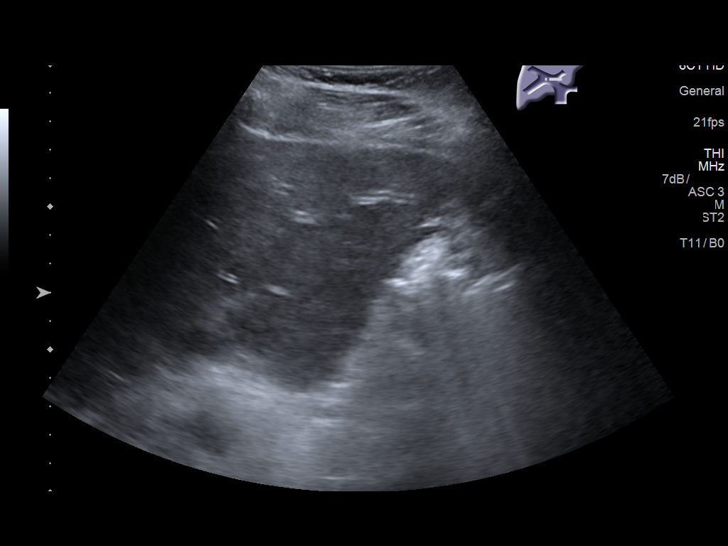
[im 23/35]
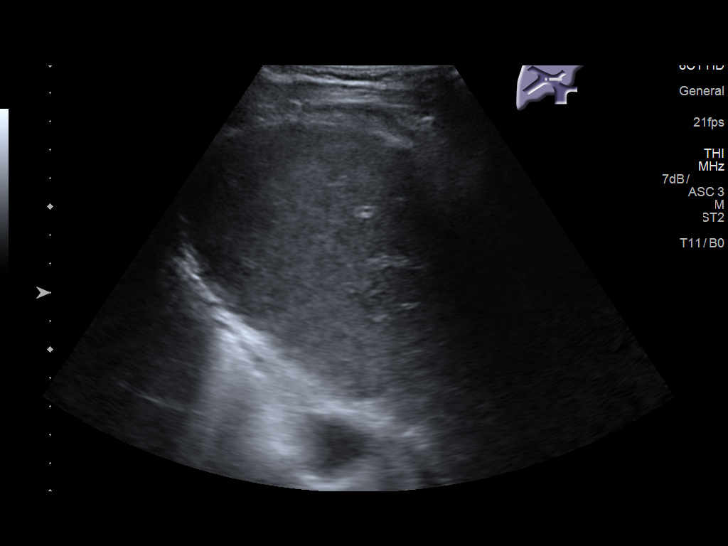
[im 26/35]
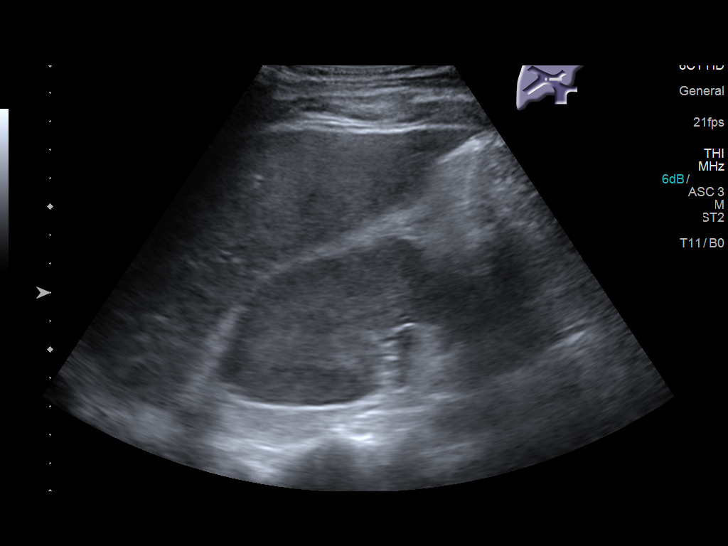
[im 29/35]
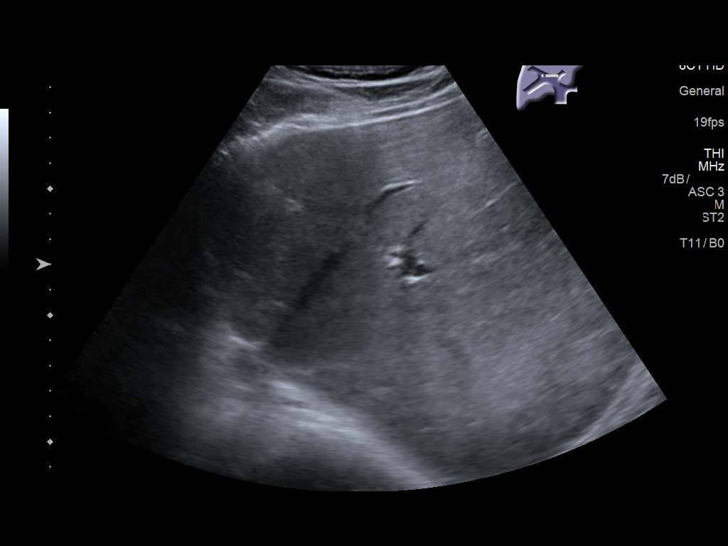
[im 32/35]
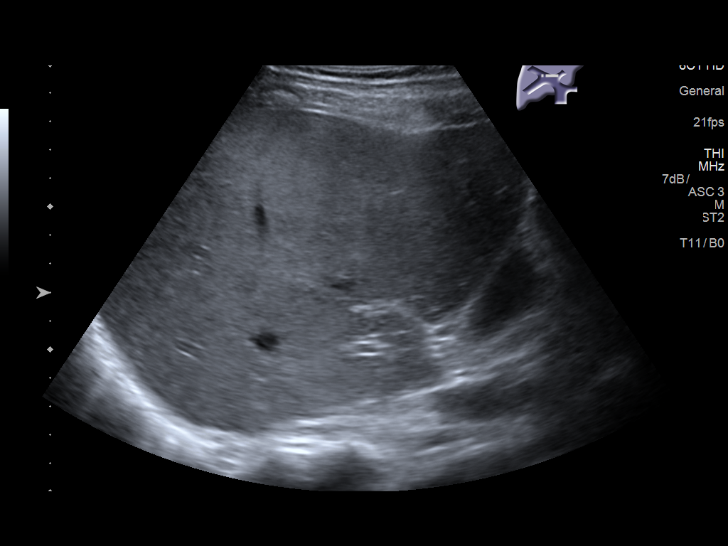
[im 35/35]
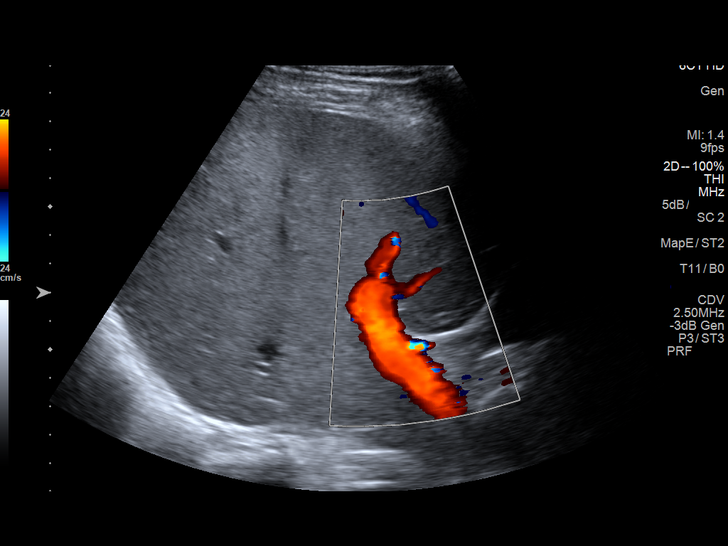

[14 of 25 positions shown; findings below may reference images not displayed]

FINDINGS: Gallbladder:

No gallstones or wall thickening visualized. No sonographic Murphy
sign noted by sonographer.

Common bile duct:

Diameter: 2 mm

Liver:

No focal lesion identified. Within normal limits in parenchymal
echogenicity. Portal vein is patent on color Doppler imaging with
normal direction of blood flow towards the liver.
IMPRESSION: Normal right upper quadrant ultrasound.

## 2020-05-26 MED ORDER — TIZANIDINE 4 MG TAB
4 mg | ORAL_TABLET | ORAL | 1 refills | Status: DC
Start: 2020-05-26 — End: 2020-07-25

## 2020-07-03 ENCOUNTER — Encounter

## 2020-07-14 ENCOUNTER — Inpatient Hospital Stay: Admit: 2020-07-14 | Payer: MEDICARE | Attending: Physician Assistant | Primary: Internal Medicine

## 2020-07-14 DIAGNOSIS — C8449 Peripheral T-cell lymphoma, not classified, extranodal and solid organ sites: Secondary | ICD-10-CM

## 2020-07-14 LAB — CREATININE, POC
Creatinine, POC: 1.2 MG/DL (ref 0.6–1.3)
GFRAA, POC: >60 mL/min/{1.73_m2} (ref >60)
GFRNA, POC: >60 mL/min/{1.73_m2} (ref >60)

## 2020-07-14 LAB — AMB POC CREATININE
Creatinine, POC: 1.2 MG/DL (ref 0.6–1.3)
GFR African American: >60 mL/min/{1.73_m2} (ref >60)
GFR Non-African American: >60 mL/min/{1.73_m2} (ref >60)

## 2020-07-14 MED ORDER — IOPAMIDOL 61 % IV SOLN
300 mg iodine /mL (61 %) | Freq: Once | INTRAVENOUS | Status: AC
Start: 2020-07-14 — End: 2020-07-14
  Administered 2020-07-14: 12:00:00 via INTRAVENOUS

## 2020-07-14 MED FILL — ISOVUE-300  61 % INTRAVENOUS SOLUTION: 300 mg iodine /mL (61 %) | INTRAVENOUS | Qty: 100

## 2020-07-25 MED ORDER — TIZANIDINE 4 MG TAB
4 mg | ORAL_TABLET | ORAL | 1 refills | Status: DC
Start: 2020-07-25 — End: 2020-09-11

## 2020-09-11 ENCOUNTER — Ambulatory Visit: Attending: Internal Medicine | Primary: Internal Medicine

## 2020-09-11 ENCOUNTER — Inpatient Hospital Stay: Admit: 2020-09-11 | Payer: MEDICARE | Primary: Internal Medicine

## 2020-09-11 ENCOUNTER — Ambulatory Visit: Admit: 2020-09-11 | Discharge: 2020-09-11 | Payer: MEDICARE | Attending: Internal Medicine | Primary: Internal Medicine

## 2020-09-11 DIAGNOSIS — E785 Hyperlipidemia, unspecified: Secondary | ICD-10-CM

## 2020-09-11 DIAGNOSIS — G8929 Other chronic pain: Secondary | ICD-10-CM

## 2020-09-11 LAB — LIPID PANEL
CHOL/HDL Ratio: 5.2 — ABNORMAL HIGH (ref 0–5.0)
Chol/HDL Ratio: 5.2 — ABNORMAL HIGH (ref 0–5.0)
Cholesterol, Total: 181 MG/DL (ref <200)
Cholesterol, total: 181 MG/DL (ref <200)
HDL Cholesterol: 35 MG/DL — ABNORMAL LOW (ref 40–60)
HDL: 35 MG/DL — ABNORMAL LOW (ref 40–60)
LDL Calculated: 86.4 MG/DL (ref 0–100)
LDL, calculated: 86.4 MG/DL (ref 0–100)
Triglyceride: 298 MG/DL — ABNORMAL HIGH (ref <150)
Triglycerides: 298 MG/DL — ABNORMAL HIGH (ref <150)
VLDL Cholesterol Calculated: 59.6 MG/DL
VLDL, calculated: 59.6 MG/DL

## 2020-09-11 LAB — METABOLIC PANEL, COMPREHENSIVE
A-G Ratio: 1.1 (ref 0.8–1.7)
ALT (SGPT): 29 U/L (ref 16–61)
AST (SGOT): 22 U/L (ref 10–38)
Albumin: 3.8 g/dL (ref 3.4–5.0)
Alk. phosphatase: 97 U/L (ref 45–117)
Anion gap: 7 mmol/L (ref 3.0–18)
BUN/Creatinine ratio: 10 — ABNORMAL LOW (ref 12–20)
BUN: 11 MG/DL (ref 7.0–18)
Bilirubin, total: 0.3 MG/DL (ref 0.2–1.0)
CO2: 22 mmol/L (ref 21–32)
Calcium: 9 MG/DL (ref 8.5–10.1)
Chloride: 111 mmol/L (ref 100–111)
Creatinine: 1.15 MG/DL (ref 0.6–1.3)
GFR est AA: >60 mL/min/{1.73_m2} (ref >60)
GFR est non-AA: >60 mL/min/{1.73_m2} (ref >60)
Globulin: 3.5 g/dL (ref 2.0–4.0)
Glucose: 90 mg/dL (ref 74–99)
Potassium: 4.3 mmol/L (ref 3.5–5.5)
Protein, total: 7.3 g/dL (ref 6.4–8.2)
Sodium: 140 mmol/L (ref 136–145)

## 2020-09-11 LAB — COMPREHENSIVE METABOLIC PANEL
ALT: 29 U/L (ref 16–61)
AST: 22 U/L (ref 10–38)
Albumin/Globulin Ratio: 1.1 (ref 0.8–1.7)
Albumin: 3.8 g/dL (ref 3.4–5.0)
Alkaline Phosphatase: 97 U/L (ref 45–117)
Anion Gap: 7 mmol/L (ref 3.0–18)
BUN: 11 MG/DL (ref 7.0–18)
Bun/Cre Ratio: 10 — ABNORMAL LOW (ref 12–20)
CO2: 22 mmol/L (ref 21–32)
Calcium: 9 MG/DL (ref 8.5–10.1)
Chloride: 111 mmol/L (ref 100–111)
Creatinine: 1.15 MG/DL (ref 0.6–1.3)
EGFR IF NonAfrican American: >60 mL/min/{1.73_m2} (ref >60)
GFR African American: >60 mL/min/{1.73_m2} (ref >60)
Globulin: 3.5 g/dL (ref 2.0–4.0)
Glucose: 90 mg/dL (ref 74–99)
Potassium: 4.3 mmol/L (ref 3.5–5.5)
Sodium: 140 mmol/L (ref 136–145)
Total Bilirubin: 0.3 MG/DL (ref 0.2–1.0)
Total Protein: 7.3 g/dL (ref 6.4–8.2)

## 2020-09-11 MED ORDER — TIZANIDINE 4 MG TAB
4 mg | ORAL_TABLET | Freq: Four times a day (QID) | ORAL | 1 refills | Status: DC | PRN
Start: 2020-09-11 — End: 2020-12-28

## 2020-09-11 MED ORDER — OMEPRAZOLE 40 MG CAP, DELAYED RELEASE
40 mg | ORAL_CAPSULE | Freq: Every day | ORAL | 1 refills | Status: DC
Start: 2020-09-11 — End: 2021-01-24

## 2020-09-11 MED ORDER — IBUPROFEN 800 MG TAB
800 mg | ORAL_TABLET | Freq: Three times a day (TID) | ORAL | 1 refills | Status: DC | PRN
Start: 2020-09-11 — End: 2021-01-28

## 2020-09-11 MED ORDER — LIDOCAINE 5 % (700 MG/PATCH) ADHESIVE PATCH
5 % | CUTANEOUS | 5 refills | Status: AC
Start: 2020-09-11 — End: ?

## 2020-09-11 NOTE — Progress Notes (Signed)
 Patient is in the office today for a 6 month follow up.  Patient states he tried a lidocaine patch and it seems to help with his back pain.  Patient is requesting a prescription for   Lidocaine patch.     1. Have you been to the ER, urgent care clinic since your last visit?  Hospitalized since your last visit? No    2. Have you seen or consulted any other health care providers outside of the [facility] since your last visit? [facility]     3. For patients aged 62-75: Has the patient had a colonoscopy / FIT/ Cologuard? No      If the patient is male:    4. For patients aged 35-74: Has the patient had a mammogram within the past 2 years? NA - based on age or sex      62. For patients aged 21-65: Has the patient had a pap smear? NA - based on age or sex

## 2020-09-11 NOTE — Progress Notes (Signed)
Dean Walker is a 50 y.o.  male and presents with Back Pain (chronic) and GERD (f/U)      SUBJECTIVE:  Osteoarthritis and Chronic Pain:  Patient has osteoarthritis, primarily affecting the back.   Symptoms onset: problem is longstanding.  Rheumatological ROS: using Motrin and Zanaflex on a regular basis for pain control.  Lidoderm patch has also been helpful for him and he would like a prescription for this medication.  Response to treatment plan: pt had ETOH and marijuana in UDS so stopped  Norco and will not prescribe narcotic meds due high risk of abuse.  Patient continues to use marijuana for pain control.    Pt's GERD is well controlled Prilosec     Patient has a history of NHL.  Dean Walker first noticed a cough around April/2019 and had a 20lb weight loss around the same time due to decreased appetite. He presented to the ED on 08/09/17 for worsening SOB and a CTA chest was performed and revealed innumerable pulmonary nodules highly concerning for metastatic disease. 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 Bone marrow biopsy was negative. He completed 6 cycles of Adcetris, Doxorubicin, Cytoxan, and Prednisone without incident in October/2019. Post treatment PET scan in November/2019 revealed complete remission. He was referred to [facility] for consideration of autologous stem cell transplant but since he was in remission plan was to monitor q 3 months for now for 2 years.  Patient received his treatment in [place] but is now back in [place] and is seeing Dr Meryl Crutch.    Patient has dyslipidemia which she will try and improved with diet and exercise.  He is limited with exercise because of his chronic low back pain.  Patient also will be a good candidate for heart scan.      Respiratory ROS: negative for - shortness of breath  Cardiovascular ROS: negative for - chest pain    Current Outpatient Medications   Medication Sig    ??? tiZANidine (ZANAFLEX) 4 mg tablet Take 1 Tablet by mouth every six (6) hours as needed for Pain.   ??? omeprazole (PRILOSEC) 40 mg capsule Take 1 Capsule by mouth daily.   ??? ibuprofen (MOTRIN) 800 mg tablet Take 1 Tablet by mouth every eight (8) hours as needed for Pain.   ??? lidocaine (LIDODERM) 5 % Apply patch to the affected area for 12 hours a day and remove for 12 hours a day.   ??? multivitamin capsule Take 1 Cap by mouth daily.     No current facility-administered medications for this visit.         OBJECTIVE:  alert, well appearing, and in no distress  Visit Vitals  BP 120/88 (BP 1 Location: Left arm, BP Patient Position: Sitting, BP Cuff Size: Adult)   Pulse 90   Temp 97.8 ??F (36.6 ??C) (Temporal)   Resp 16   Ht 5' 6"  (1.676 m)   Wt 169 lb (76.7 kg)   SpO2 97%   BMI 27.28 kg/m??      Chest - clear to auscultation, no wheezes, rales or rhonchi, symmetric air entry  Heart - normal rate, regular rhythm, normal S1, S2, no murmurs, rubs, clicks or gallops  Extremities - peripheral pulses normal, no pedal edema, no clubbing or cyanosis    Labs:   Lab Results   Component Value Date/Time    WBC 10.0 03/04/2019 11:20 AM    HGB 14.9  03/04/2019 11:20 AM    HCT 42.8 03/04/2019 11:20 AM    PLATELET 268 03/04/2019 11:20 AM    MCV 101.7 (H) 03/04/2019 11:20 AM     Lab Results   Component Value Date/Time    Cholesterol, total 181 09/11/2020 11:20 AM    HDL Cholesterol 35 (L) 09/11/2020 11:20 AM    LDL, calculated 86.4 09/11/2020 11:20 AM    Triglyceride 298 (H) 09/11/2020 11:20 AM    CHOL/HDL Ratio 5.2 (H) 09/11/2020 11:20 AM     Lab Results   Component Value Date/Time    Sodium 140 09/11/2020 11:20 AM    Potassium 4.3 09/11/2020 11:20 AM    Chloride 111 09/11/2020 11:20 AM    CO2 22 09/11/2020 11:20 AM    Anion gap 7 09/11/2020 11:20 AM    Glucose 90 09/11/2020 11:20 AM    BUN 11 09/11/2020 11:20 AM    Creatinine 1.15 09/11/2020 11:20 AM    BUN/Creatinine ratio 10 (L) 09/11/2020 11:20 AM    GFR est AA >60 09/11/2020 11:20 AM     GFR est non-AA >60 09/11/2020 11:20 AM    Calcium 9.0 09/11/2020 11:20 AM    Bilirubin, total 0.3 09/11/2020 11:20 AM    ALT (SGPT) 29 09/11/2020 11:20 AM    Alk. phosphatase 97 09/11/2020 11:20 AM    Protein, total 7.3 09/11/2020 11:20 AM    Albumin 3.8 09/11/2020 11:20 AM    Globulin 3.5 09/11/2020 11:20 AM    A-G Ratio 1.1 09/11/2020 11:20 AM        Labs:   Lab Results   Component Value Date/Time    Prostate Specific Ag 1.1 03/13/2020 11:50 AM    Prostate Specific Ag 0.8 03/04/2019 11:20 AM    Prostate Specific Ag 0.8 01/23/2017 10:04 AM               Assessment/Plan    ICD-10-CM ICD-9-CM    1. Dyslipidemia  E78.5 272.4  will try and improve with diet LIPID PANEL      METABOLIC PANEL, COMPREHENSIVE      METABOLIC PANEL, COMPREHENSIVE      LIPID PANEL   2. Chronic midline low back pain with left-sided sciatica  M54.42 724.2  stable on tiZANidine (ZANAFLEX) 4 mg tablet    G89.29 724.3 ibuprofen (MOTRIN) 800 mg tablet     338.29 lidocaine (LIDODERM) 5 %   3. Gastroesophageal reflux disease without esophagitis  K21.9 530.81  well-controlled on omeprazole (PRILOSEC) 40 mg capsule   4. Colon cancer screening  Z12.11 V76.51 REFERRAL TO GASTROENTEROLOGY   5. Non-Hodgkin's lymphoma, unspecified body region, unspecified non-Hodgkin lymphoma type ([place])  C85.90 202.80  patient currently remission and being followed by hematology.         Follow-up and Dispositions    ?? Return in about 6 months (around 03/13/2021).         Reviewed plan of care. Patient has provided input and agrees with goals.

## 2020-09-24 NOTE — Progress Notes (Signed)
LIDOCAINE PAD 5%, use as directed, is approved through 03/27/2021 under your Medicare Part D  benefit. Reviewed by: System

## 2020-11-16 ENCOUNTER — Encounter

## 2020-11-25 ENCOUNTER — Inpatient Hospital Stay: Admit: 2020-11-25 | Payer: MEDICARE | Attending: Physician Assistant | Primary: Internal Medicine

## 2020-11-25 DIAGNOSIS — C8449 Peripheral T-cell lymphoma, not classified, extranodal and solid organ sites: Secondary | ICD-10-CM

## 2020-11-25 MED ORDER — IOPAMIDOL 61 % IV SOLN
61 % | Freq: Once | INTRAVENOUS | Status: AC
Start: 2020-11-25 — End: 2020-11-25
  Administered 2020-11-25: 14:00:00 via INTRAVENOUS

## 2020-11-25 MED FILL — ISOVUE-300  61 % INTRAVENOUS SOLUTION: 300 mg iodine /mL (61 %) | INTRAVENOUS | Qty: 100

## 2020-11-25 NOTE — Progress Notes (Signed)
Approximate 30cc extravasation in right bicep, evaluated by RN Weldon Inches and extravasation discharge instructions provided to patient. Patient aware of what to monitor for.

## 2020-11-26 LAB — CREATININE, POC
Creatinine, POC: 1.3 MG/DL (ref 0.6–1.3)
GFRAA, POC: >60 mL/min/{1.73_m2} (ref >60)
GFRNA, POC: 58 mL/min/{1.73_m2} — ABNORMAL LOW (ref >60)

## 2020-11-26 LAB — AMB POC CREATININE
Creatinine, POC: 1.3 MG/DL (ref 0.6–1.3)
GFR African American: >60 mL/min/{1.73_m2} (ref >60)
GFR Non-African American: 58 mL/min/{1.73_m2} — ABNORMAL LOW (ref >60)

## 2020-12-28 ENCOUNTER — Encounter

## 2020-12-28 MED ORDER — TIZANIDINE 4 MG TAB
4 mg | ORAL_TABLET | ORAL | 1 refills | Status: DC
Start: 2020-12-28 — End: 2021-02-23

## 2020-12-30 ENCOUNTER — Inpatient Hospital Stay: Payer: MEDICARE

## 2020-12-30 MED ORDER — FENTANYL CITRATE (PF) 50 MCG/ML IJ SOLN
50 mcg/mL | INTRAMUSCULAR | Status: AC
Start: 2020-12-30 — End: ?

## 2020-12-30 MED ORDER — HYDRALAZINE 20 MG/ML IJ SOLN
20 mg/mL | Freq: Once | INTRAMUSCULAR | Status: AC
Start: 2020-12-30 — End: 2020-12-30
  Administered 2020-12-30: 15:00:00 via INTRAVENOUS

## 2020-12-30 MED ORDER — PROPOFOL 10 MG/ML IV EMUL
10 mg/mL | INTRAVENOUS | Status: DC | PRN
Start: 2020-12-30 — End: 2020-12-30
  Administered 2020-12-30 (×5): via INTRAVENOUS

## 2020-12-30 MED ORDER — SODIUM CHLORIDE 0.9 % IJ SYRG
Freq: Three times a day (TID) | INTRAMUSCULAR | Status: DC
Start: 2020-12-30 — End: 2020-12-30

## 2020-12-30 MED ORDER — MIDAZOLAM 1 MG/ML IJ SOLN
1 mg/mL | INTRAMUSCULAR | Status: DC | PRN
Start: 2020-12-30 — End: 2020-12-30
  Administered 2020-12-30: 13:00:00 via INTRAVENOUS

## 2020-12-30 MED ORDER — VERAPAMIL 2.5 MG/ML IV
2.5 mg/mL | INTRAVENOUS | Status: AC
Start: 2020-12-30 — End: ?

## 2020-12-30 MED ORDER — HEPARIN (PORCINE) 1,000 UNIT/ML IJ SOLN
1000 unit/mL | INTRAMUSCULAR | Status: AC
Start: 2020-12-30 — End: ?

## 2020-12-30 MED ORDER — CLOPIDOGREL 75 MG TAB
75 mg | ORAL_TABLET | Freq: Every day | ORAL | 3 refills | Status: AC
Start: 2020-12-30 — End: ?

## 2020-12-30 MED ORDER — CLOPIDOGREL 300 MG TAB
300 mg | Freq: Once | ORAL | Status: DC
Start: 2020-12-30 — End: 2020-12-30

## 2020-12-30 MED ORDER — MIDAZOLAM 1 MG/ML IJ SOLN
1 mg/mL | INTRAMUSCULAR | Status: AC
Start: 2020-12-30 — End: ?

## 2020-12-30 MED ORDER — PROPOFOL 10 MG/ML IV EMUL
10 mg/mL | INTRAVENOUS | Status: AC
Start: 2020-12-30 — End: ?

## 2020-12-30 MED ORDER — LIDOCAINE (PF) 10 MG/ML (1 %) IJ SOLN
10 mg/mL (1 %) | INTRAMUSCULAR | Status: DC | PRN
Start: 2020-12-30 — End: 2020-12-30
  Administered 2020-12-30 (×2): via SUBCUTANEOUS

## 2020-12-30 MED ORDER — SODIUM CHLORIDE 0.9 % IJ SYRG
INTRAMUSCULAR | Status: DC | PRN
Start: 2020-12-30 — End: 2020-12-30

## 2020-12-30 MED ORDER — LABETALOL 5 MG/ML IV SYRINGE
20 mg/4 mL (5 mg/mL) | INTRAVENOUS | Status: AC
Start: 2020-12-30 — End: ?

## 2020-12-30 MED ORDER — LIDOCAINE (PF) 20 MG/ML (2 %) IJ SOLN
20 mg/mL (2 %) | INTRAMUSCULAR | Status: DC | PRN
Start: 2020-12-30 — End: 2020-12-30
  Administered 2020-12-30: 13:00:00 via INTRAVENOUS

## 2020-12-30 MED ORDER — IODIXANOL 320 MG/ML IV SOLN
320 mg iodine/mL | INTRAVENOUS | Status: DC | PRN
Start: 2020-12-30 — End: 2020-12-30
  Administered 2020-12-30: 14:00:00 via INTRA_ARTERIAL

## 2020-12-30 MED ORDER — FENTANYL CITRATE (PF) 50 MCG/ML IJ SOLN
50 mcg/mL | INTRAMUSCULAR | Status: DC | PRN
Start: 2020-12-30 — End: 2020-12-30
  Administered 2020-12-30 (×4): via INTRAVENOUS

## 2020-12-30 MED ORDER — SODIUM CHLORIDE 0.9 % IV
INTRAVENOUS | Status: DC | PRN
Start: 2020-12-30 — End: 2020-12-30
  Administered 2020-12-30: 13:00:00 via INTRAVENOUS

## 2020-12-30 MED ORDER — HEPARIN (PORCINE) IN NS (PF) 1,000 UNIT/500 ML IV
1000 unit/500 mL | INTRAVENOUS | Status: AC
Start: 2020-12-30 — End: ?

## 2020-12-30 MED ORDER — LIDOCAINE (PF) 10 MG/ML (1 %) IJ SOLN
10 mg/mL (1 %) | INTRAMUSCULAR | Status: AC
Start: 2020-12-30 — End: ?

## 2020-12-30 MED ORDER — LIDOCAINE (PF) 20 MG/ML (2 %) IJ SOLN
20 mg/mL (2 %) | INTRAMUSCULAR | Status: AC
Start: 2020-12-30 — End: ?

## 2020-12-30 MED ORDER — HEPARIN (PORCINE) 1,000 UNIT/ML IJ SOLN
1000 unit/mL | INTRAMUSCULAR | Status: DC | PRN
Start: 2020-12-30 — End: 2020-12-30
  Administered 2020-12-30: 13:00:00 via INTRAVENOUS

## 2020-12-30 MED ORDER — LABETALOL 5 MG/ML IV SYRINGE
20 mg/4 mL (5 mg/mL) | Freq: Once | INTRAVENOUS | Status: AC
Start: 2020-12-30 — End: 2020-12-30
  Administered 2020-12-30: 14:00:00 via INTRAVENOUS

## 2020-12-30 MED ORDER — NITROGLYCERIN IN D5W 100 MG/250 ML (0.4 MG/ML) IV
100 mg/250 mL (400 mcg/mL) | INTRAVENOUS | Status: AC
Start: 2020-12-30 — End: ?

## 2020-12-30 MED FILL — VERAPAMIL 2.5 MG/ML IV: 2.5 mg/mL | INTRAVENOUS | Qty: 2

## 2020-12-30 MED FILL — NITROGLYCERIN IN D5W 100 MG/250 ML (0.4 MG/ML) IV: 100 mg/250 mL (400 mcg/mL) | INTRAVENOUS | Qty: 250

## 2020-12-30 MED FILL — LABETALOL 5 MG/ML IV SYRINGE: 20 mg/4 mL (5 mg/mL) | INTRAVENOUS | Qty: 4

## 2020-12-30 MED FILL — HEPARIN (PORCINE) IN NS (PF) 1,000 UNIT/500 ML IV: 1000 unit/500 mL | INTRAVENOUS | Qty: 1000

## 2020-12-30 MED FILL — BD POSIFLUSH NORMAL SALINE 0.9 % INJECTION SYRINGE: INTRAMUSCULAR | Qty: 40

## 2020-12-30 MED FILL — HEPARIN (PORCINE) 1,000 UNIT/ML IJ SOLN: 1000 unit/mL | INTRAMUSCULAR | Qty: 10

## 2020-12-30 MED FILL — XYLOCAINE-MPF 20 MG/ML (2 %) INJECTION SOLUTION: 20 mg/mL (2 %) | INTRAMUSCULAR | Qty: 5

## 2020-12-30 MED FILL — FENTANYL CITRATE (PF) 50 MCG/ML IJ SOLN: 50 mcg/mL | INTRAMUSCULAR | Qty: 2

## 2020-12-30 MED FILL — MIDAZOLAM 1 MG/ML IJ SOLN: 1 mg/mL | INTRAMUSCULAR | Qty: 2

## 2020-12-30 MED FILL — HYDRALAZINE 20 MG/ML IJ SOLN: 20 mg/mL | INTRAMUSCULAR | Qty: 1

## 2020-12-30 MED FILL — XYLOCAINE-MPF 10 MG/ML (1 %) INJECTION SOLUTION: 10 mg/mL (1 %) | INTRAMUSCULAR | Qty: 30

## 2020-12-30 MED FILL — DIPRIVAN 10 MG/ML INTRAVENOUS EMULSION: 10 mg/mL | INTRAVENOUS | Qty: 80

## 2020-12-30 NOTE — Anesthesia Post-Procedure Evaluation (Signed)
Procedure(s):  ANGIOGRAPHY LOWER EXT LEFT/poss bilat iliac stents.    MAC    Anesthesia Post Evaluation      Multimodal analgesia: multimodal analgesia used between 6 hours prior to anesthesia start to PACU discharge  Patient location during evaluation: PACU  Patient participation: complete - patient participated  Level of consciousness: sleepy but conscious  Pain management: adequate  Airway patency: patent  Anesthetic complications: no  Cardiovascular status: acceptable  Respiratory status: acceptable  Hydration status: acceptable  Post anesthesia nausea and vomiting:  controlled  Final Post Anesthesia Temperature Assessment:  Normothermia (36.0-37.5 degrees C)      INITIAL Post-op Vital signs:   Vitals Value Taken Time   BP 154/105 12/30/20 1003   Temp 36.4 ??C (97.5 ??F) 12/30/20 1003   Pulse 90 12/30/20 1005   Resp 17 12/30/20 1005   SpO2 97 % 12/30/20 1004   Vitals shown include unvalidated device data.

## 2020-12-30 NOTE — Op Note (Signed)
Operative Note    Patient: Dean Walker  Date of Birth: 1971-02-14  MRN: [id number]    Date of Procedure: 12/30/2020     Pre-Op Diagnosis: PVD (peripheral vascular disease) with claudication ([place]) [I73.9]    Post-Op Diagnosis:  Occlusion of left external iliac artery       Procedure(s):  Ultrasound of bilateral femoral arteries with interpretation  Placement of catheter to aorta, aortoiliac angiogram with interpretation  Use of IVUS initial vessel noncoronary left iliac arteries with interpretation  Balloon angioplasty and stent of left common and external iliac artery x3 with angiogram interpretation    Surgeon(s):  Raziyah Vanvleck, [place], MD    Surgical Assistant: None    Anesthesia: Con-Sed     Estimated Blood Loss (mL):  Minimal    Complications: None    Specimens: * No specimens in log *     Implants:   Implant Name Type Inv. Item Serial No. Manufacturer Lot No. LRB No. Used Action   STENT PERIPH EXP [id number] -- Charmaine Downs - [id number]  STENT PERIPH EXP [id number] -- OMNILINK ELITE  [employer]WD [id number] N/A 1 Implanted   STENT PERIPH AD L57MM DIA6MM CATH L75CM BLLN L60MM SHTH 6FR - [id number]  STENT PERIPH AD L57MM DIA6MM CATH L75CM BLLN L60MM SHTH 6FR  [employer]_WD [id number] N/A 1 Implanted   STENT BILI LD PMTD 6FR 6X30 75 -- EXPRESS [id number] - [id number]  STENT BILI LD PMTD 6FR 6X30 75 -- EXPRESS [id number]  [employer] CARDIOVASC_WD [id number] N/A 1 Implanted       Drains: * No LDAs found *    Findings: Aorta: Distal aorta is patent without stenosis.  Right common iliac artery is fully patent.  Is a moderately diseased external iliac artery on the right the internal is patent.  Right common femoral artery is patent with appropriate sheath access.    Left common iliac artery is patent and the internal is robust giving collaterals to the left common femoral artery.  The external iliac artery is 100% occluded.    Pioneer IVUS catheter was used to identify the flow lumen after  crossing the occluded external iliac with reentry into the common iliac artery.  Reconstruction of the external iliac was done with stenting see below    Detailed Description of Procedure:   Bilateral femorals groins were prepped draped usual standard fashion.  Patient had moderate sedation.  First it examined the right groin for the common femoral artery identified the mid common femoral localized in the direct puncture at 12:00 placing a wire all the way into the aorta seen on fluoroscopy.  Placed a 4 French sheath and exchanged the wire for a flush catheter.  Performed an aortogram demonstrating the completely occluded left external iliac artery.  Next performed a ultrasound of the left common femoral identified dislocation localized and accessed with a Bentson wire and placed a 6 French sheath into the common femoral.  Here exchanged for a Glidewire and gained access to the occluded external iliac artery and navigated the wire up to the level of the common iliac then aorta.  Exchange the wire with a directional catheter did make an attempt to reenter but could not and placed a 014 wire.  I brought the IVUS up over this wire and identified the common iliac flow lumen and deployed the pin and then an 014 wire into the lumen of the common iliac and then up into the aorta.  I pulled the IVUS device or Pioneer device out  and then placed the catheter over the 014 wire and exchanged for a Magic torque.  Now did focused angiography demonstrating wire presents in the occlusion.  I balloon angioplastied from the common iliac into the occluded external with a 7 x 59 balloon expandable stent.  Then extended with a 6 x 59 further into the external.  A follow-up angiogram there is still some occlusive disease so I deployed a third 6 x 29 balloon expandable stent now showing patency from the aorta all the way to the common femoral with no extravasation or dissection.  I was pleased with this result he had a nice pulse here  now.  I deployed a Vascade appropriately without complication on the left.  Wired and pulled the catheter from the right leaving the 4 [place] in place for manual pull.  He was heparinized for the procedure with 5000 of heparin.  No complications were encountered    Electronically Signed by [place] Martese Vanatta, MD on 12/30/2020 at 10:12 AM
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
## 2020-12-30 NOTE — Progress Notes (Signed)
AVS Discharge instructions reviewed with patient and copy given to patient.  All questions answered.  Patient verbalized understanding to all discharge instructions.  PIV removed. Procedural site within normal limits.  No hematoma or bleeding noted from procedural and PIV site. No pain noted at discharge.  Patient discharged with support person in stable condition.  Escorted out to vehicle for transport home.

## 2020-12-30 NOTE — Anesthesia Pre-Procedure Evaluation (Signed)
Relevant Problems   NEUROLOGY   (+) Migraine headache       Anesthetic History   No history of anesthetic complications            Review of Systems / Medical History  Patient summary reviewed, nursing notes reviewed and pertinent labs reviewed    Pulmonary  Within defined limits                 Neuro/Psych   Within defined limits           Cardiovascular  Within defined limits                     GI/Hepatic/Renal  Within defined limits              Endo/Other  Within defined limits           Other Findings   Comments: Prior nephrectomy, renal function wnl           Physical Exam    Airway  Mallampati: II  TM Distance: 4 - 6 cm  Neck ROM: normal range of motion   Mouth opening: Normal     Cardiovascular    Rhythm: regular  Rate: normal         Dental  No notable dental hx       Pulmonary  Breath sounds clear to auscultation               Abdominal  GI exam deferred       Other Findings            Anesthetic Plan    ASA: 2  Anesthesia type: MAC          Induction: Intravenous  Anesthetic plan and risks discussed with: Patient

## 2020-12-30 NOTE — Progress Notes (Signed)
 [id number]  TRANSFER - OUT REPORT:    Verbal report given to [facility] on Dean Walker  being transferred to Cath Lab for ordered procedure       Report consisted of patient's Situation, Background, Assessment and   Recommendations(SBAR).     Information from the following report(s) SBAR, Intake/Output, MAR, Accordion, Recent Results, Med Rec Status, Pre Procedure Checklist, Procedure Verification, Quality Measures, and Dual Neuro Assessment was reviewed with the receiving nurse.    Lines:   Peripheral IV 12/30/20 Right Antecubital (Active)   Site Assessment Clean, dry, & intact 12/30/20 0757   Phlebitis Assessment 0 12/30/20 0757   Dressing Status Clean, dry, & intact 12/30/20 0757   Dressing Type Transparent 12/30/20 0757        Opportunity for questions and clarification was provided.      Patient transported with:   Tech    [phone]  TRANSFER - IN REPORT:    Verbal report received from Amy(name) on Dean Walker  being received from Cath lab(unit) for routine post - op      Report consisted of patient's Situation, Background, Assessment and   Recommendations(SBAR).     Information from the following report(s) SBAR, Procedure Summary, Intake/Output, MAR, Accordion, Recent Results, Procedure Verification, Quality Measures, and Dual Neuro Assessment was reviewed with the receiving nurse.    Opportunity for questions and clarification was provided.      Assessment completed upon patient's arrival to unit and care assumed.

## 2020-12-30 NOTE — Progress Notes (Signed)
Cath holding summary     Patient escorted to cath holding from waiting area ambulatory, alert and oriented x 4, voicing no complaints.  Changed into gown and placed on monitor.   NPO since MN.  Lab results, med rec and H&P reviewed on chart.  PIV x 1 inserted without difficulty.  Family to bedside.

## 2021-01-24 ENCOUNTER — Encounter

## 2021-01-24 MED ORDER — OMEPRAZOLE 40 MG CAP, DELAYED RELEASE
40 mg | ORAL_CAPSULE | Freq: Every day | ORAL | 1 refills | Status: AC
Start: 2021-01-24 — End: ?

## 2021-01-28 ENCOUNTER — Encounter

## 2021-01-28 MED ORDER — IBUPROFEN 800 MG TAB
800 mg | ORAL_TABLET | ORAL | 2 refills | Status: AC
Start: 2021-01-28 — End: ?

## 2021-02-23 ENCOUNTER — Encounter

## 2021-02-23 MED ORDER — TIZANIDINE 4 MG TAB
4 mg | ORAL_TABLET | ORAL | 1 refills | Status: AC
Start: 2021-02-23 — End: ?

## 2021-03-12 ENCOUNTER — Ambulatory Visit: Admit: 2021-03-12 | Discharge: 2021-03-12 | Payer: MEDICARE | Attending: Internal Medicine | Primary: Internal Medicine

## 2021-03-12 DIAGNOSIS — Z0000 Encounter for general adult medical examination without abnormal findings: Secondary | ICD-10-CM

## 2021-03-12 MED ORDER — ROSUVASTATIN 5 MG TAB
5 mg | ORAL_TABLET | Freq: Every evening | ORAL | 1 refills | Status: AC
Start: 2021-03-12 — End: ?

## 2021-03-12 NOTE — Progress Notes (Signed)
 Patient is in the office today for a 6 month follow up and Medicare Wellness Visit.     1. Have you been to the ER, urgent care clinic since your last visit?  Hospitalized since your last visit? No    2. Have you seen or consulted any other health care providers outside of the [facility] since your last visit? No     3. For patients aged 51-75: Has the patient had a colonoscopy / FIT/ Cologuard? Yes - no Care Gap present      If the patient is male:    4. For patients aged 60-74: Has the patient had a mammogram within the past 2 years? NA - based on age or sex      20. For patients aged 21-65: Has the patient had a pap smear? NA - based on age or sex      Verbal order read back per Dr. Waldon flu vaccine.  Patient received flu vaccine in right deltoid. Patient was observed and no signs or symptoms of an allergic reaction noted at this time.  Patient tolerated well and left without complaints.  Patient received flu VIS.

## 2021-03-12 NOTE — Progress Notes (Signed)
Dean Walker is a 50 y.o.  male and presents with Cholesterol Problem (Start crestor ), Back Pain (Follow up), Annual Wellness Visit, Immunization/Injection (Flu vaccine/), and Colon Cancer Screening (For colonoscopy 12/29)      SUBJECTIVE:  Osteoarthritis and Chronic Pain:  Patient has osteoarthritis, primarily affecting the back.   Symptoms onset: problem is longstanding.  Rheumatological ROS: using Motrin and Zanaflex on a regular basis for pain control.  Lidoderm patch has also been helpful for him and he would like a prescription for this medication.  Response to treatment plan: pt had ETOH and marijuana in UDS so stopped  Norco and will not prescribe narcotic meds due high risk of abuse.  Patient continues to use marijuana for pain control.    Pt's GERD is well controlled Prilosec     Patient has a history of NHL.  Dean Walker first noticed a cough around April/2019 and had a 20lb weight loss around the same time due to decreased appetite. He presented to the ED on 08/09/17 for worsening SOB and a CTA chest was performed and revealed innumerable pulmonary nodules highly concerning for metastatic disease. 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 Bone marrow biopsy was negative. He completed 6 cycles of Adcetris, Doxorubicin, Cytoxan, and Prednisone without incident in October/2019. Post treatment PET scan in November/2019 revealed complete remission. He was referred to [facility] for consideration of autologous stem cell transplant but since he was in remission plan was to monitor q 3 months for now for 2 years.  Patient received his treatment in [place] but is now back in [place] and is seeing Dr Meryl Crutch.    Patient was having claudication and was referred to vascular surgery where he had intervention which showed occlusion of left external iliac artery.  He had angioplasty and stent of the left common and external iliac  artery x3.  He remains on Plavix.  Due to history of peripheral artery disease we will start patient on Crestor 5 mg daily for his dyslipidemia.  In his LDL <70.      Respiratory ROS: negative for - shortness of breath  Cardiovascular ROS: negative for - chest pain    Current Outpatient Medications   Medication Sig    rosuvastatin (CRESTOR) 5 mg tablet Take 1 Tablet by mouth nightly.    tiZANidine (ZANAFLEX) 4 mg tablet TAKE 1 TABLET BY MOUTH EVERY 6 HOURS AS NEEDED FOR PAIN    ibuprofen (MOTRIN) 800 mg tablet TAKE 1 TABLET BY MOUTH EVERY 8 HOURS AS NEEDED FOR PAIN    omeprazole (PRILOSEC) 40 mg capsule TAKE 1 CAPSULE BY MOUTH DAILY    clopidogreL (Plavix) 75 mg tab Take 1 Tablet by mouth daily.    multivitamin capsule Take 1 Cap by mouth daily.    lidocaine (LIDODERM) 5 % Apply patch to the affected area for 12 hours a day and remove for 12 hours a day. (Patient not taking: Reported on 03/12/2021)     No current facility-administered medications for this visit.         OBJECTIVE:  alert, well appearing, and in no distress  Visit Vitals  BP 125/82   Pulse 81   Temp 98.9 ??F (37.2 ??C) (Temporal)   Resp 18   Ht 5' 6"  (1.676 m)   Wt 174 lb (78.9 kg)   SpO2 98%   BMI 28.08 kg/m??      Chest - clear  to auscultation, no wheezes, rales or rhonchi, symmetric air entry  Heart - normal rate, regular rhythm, normal S1, S2, no murmurs, rubs, clicks or gallops  Extremities - peripheral pulses normal, no pedal edema, no clubbing or cyanosis    Labs:   Lab Results   Component Value Date/Time    WBC 10.0 03/04/2019 11:20 AM    HGB 14.9 03/04/2019 11:20 AM    HCT 42.8 03/04/2019 11:20 AM    PLATELET 268 03/04/2019 11:20 AM    MCV 101.7 (H) 03/04/2019 11:20 AM     Lab Results   Component Value Date/Time    Cholesterol, total 181 09/11/2020 11:20 AM    HDL Cholesterol 35 (L) 09/11/2020 11:20 AM    LDL, calculated 86.4 09/11/2020 11:20 AM    Triglyceride 298 (H) 09/11/2020 11:20 AM    CHOL/HDL Ratio 5.2 (H) 09/11/2020 11:20 AM     Lab  Results   Component Value Date/Time    Sodium 140 09/11/2020 11:20 AM    Potassium 4.3 09/11/2020 11:20 AM    Chloride 111 09/11/2020 11:20 AM    CO2 22 09/11/2020 11:20 AM    Anion gap 7 09/11/2020 11:20 AM    Glucose 90 09/11/2020 11:20 AM    BUN 11 09/11/2020 11:20 AM    Creatinine 1.15 09/11/2020 11:20 AM    BUN/Creatinine ratio 10 (L) 09/11/2020 11:20 AM    GFR est AA >60 09/11/2020 11:20 AM    GFR est non-AA >60 09/11/2020 11:20 AM    Calcium 9.0 09/11/2020 11:20 AM    Bilirubin, total 0.3 09/11/2020 11:20 AM    ALT (SGPT) 29 09/11/2020 11:20 AM    Alk. phosphatase 97 09/11/2020 11:20 AM    Protein, total 7.3 09/11/2020 11:20 AM    Albumin 3.8 09/11/2020 11:20 AM    Globulin 3.5 09/11/2020 11:20 AM    A-G Ratio 1.1 09/11/2020 11:20 AM        Labs:   Lab Results   Component Value Date/Time    Prostate Specific Ag 1.1 03/13/2020 11:50 AM    Prostate Specific Ag 0.8 03/04/2019 11:20 AM    Prostate Specific Ag 0.8 01/23/2017 10:04 AM               Assessment/Plan    ICD-10-CM ICD-9-CM    1. Medicare annual wellness visit, subsequent  Z00.00 V70.0       2. Dyslipidemia  E78.5 272.4 Uncontrolled we will start rosuvastatin (CRESTOR) 5 mg tablet      LIPID PANEL      3. Primary hypertension  I10 401.9 Controlled currently off of medication.  METABOLIC PANEL, COMPREHENSIVE      4. PVD (peripheral vascular disease) (HCC)  I73.9 443.9 Patient status post stent and continues on Plavix and will follow up with vascular surgery      5. Encounter for immunization  Z23 V03.89 ADMIN INFLUENZA VIRUS VAC      INFLUENZA, FLUARIX, FLULAVAL, FLUZONE (AGE 37 MO+), AFLURIA(AGE 3Y+) IM, PF, 0.5 ML            Follow-up and Dispositions    Return in about 3 months (around 06/10/2021) for labs 1 week before.           Reviewed plan of care. Patient has provided input and agrees with goals.

## 2021-03-12 NOTE — Progress Notes (Signed)
This is the Subsequent Medicare Annual Wellness Exam, performed 12 months or more after the Initial AWV or the last Subsequent AWV    I have reviewed the patient's medical history in detail and updated the computerized patient record.       Assessment/Plan   Education and counseling provided:  Are appropriate based on today's review and evaluation    1. Medicare annual wellness visit, subsequent  2. Dyslipidemia  -     rosuvastatin (CRESTOR) 5 mg tablet; Take 1 Tablet by mouth nightly., Normal, Disp-90 Tablet, R-1  -     LIPID PANEL; Future  3. Primary hypertension  -     METABOLIC PANEL, COMPREHENSIVE; Future  4. PVD (peripheral vascular disease) ([place])  5. Encounter for immunization  -     ADMIN INFLUENZA VIRUS VAC  -     INFLUENZA, FLUARIX, FLULAVAL, FLUZONE (AGE 51 MO+), AFLURIA(AGE 3Y+) IM, PF, 0.5 ML     Depression Risk Factor Screening     3 most recent PHQ Screens 03/12/2021   Little interest or pleasure in doing things Not at all   Feeling down, depressed, irritable, or hopeless Not at all   Total Score PHQ 2 0       Alcohol & Drug Abuse Risk Screen    Do you average more than 2 drinks per night or 14 drinks a week: No    On any one occasion in the past three months have you have had more than 4 drinks containing alcohol:  No          Functional Ability and Level of Safety    Hearing: Hearing is good.      Activities of Daily Living:  The home contains: no safety equipment.  Patient does total self care      Ambulation: with no difficulty     Fall Risk:  No flowsheet data found.   Abuse Screen:  Patient is not abused       Cognitive Screening    Has your family/caregiver stated any concerns about your memory: no     Cognitive Screening: Normal - .    Health Maintenance Due     Health Maintenance Due   Topic Date Due    Hepatitis C Screening  Never done    COVID-19 Vaccine (1) Never done    Shingrix Vaccine Age 45> (1 of 2) Never done    DTaP/Tdap/Td series (1 - Tdap) Never done    Colorectal Cancer Screening  Combo  Never done     Glaucoma Screening- Pt to f/up  Pneumonia Vaccine-  Due at age 32   Shingles Vaccine- Pt aware of [facility]   Tdap Vaccine- not UTD   Colonoscopy-  Pt scheduled for 03/19/21  PSA ordered  12/21/  1.1  Advance Directive-  Pt given information  Patient Care Team   Patient Care Team:  Thana Farr, MD as PCP - General (Internal Medicine Physician)  Thana Farr, MD as PCP - [facility] Empaneled Provider  Damle, Dellia Beckwith, MD (Hematology and Oncology)  McKenzie, [place], MD (Vascular Surgery)    History     Patient Active Problem List   Diagnosis Code    Migraine headache G43.909    Iliac artery occlusion, 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 No pertinent surgical history.  Current Outpatient Medications  Medication Sig Dispense Refill    rosuvastatin (CRESTOR) 5 mg tablet Take 1 Tablet by mouth nightly. 90 Tablet 1    tiZANidine (ZANAFLEX) 4 mg tablet TAKE 1 TABLET BY MOUTH EVERY 6 HOURS AS NEEDED FOR PAIN 120 Tablet 1    ibuprofen (MOTRIN) 800 mg tablet TAKE 1 TABLET BY MOUTH EVERY 8 HOURS AS NEEDED FOR PAIN 90 Tablet 2    omeprazole (PRILOSEC) 40 mg capsule TAKE 1 CAPSULE BY MOUTH DAILY 90 Capsule 1    clopidogreL (Plavix) 75 mg tab Take 1 Tablet by mouth daily. 30 Tablet 3    multivitamin capsule Take 1 Cap by mouth daily.      lidocaine (LIDODERM) 5 % Apply patch to the affected area for 12 hours a day and remove for 12 hours a day. (Patient not taking: Reported on 03/12/2021) 30 Each 5     Allergies   Allergen Reactions    Tramadol Nausea Only     SEVERE NAUSEA  SEVERE NAUSEA      Gabapentin Other (comments)     Caused foot pain       Family History   Problem Relation Age of Onset    Diabetes Mother     Hypertension Father      Social History     Tobacco Use    Smoking status: Former     Packs/day: 0.50     Years: 20.00     Pack years: 10.00     Types: Cigarettes     Quit date: 07/04/2017     Years since  quitting: 3.6    Smokeless tobacco: Never   Substance Use Topics    Alcohol use: Yes     Alcohol/week: 12.0 standard drinks     Types: 12 Cans of beer per week     Comment: 12 pk a week         A comprehensive 5 year plan for medical care and screening exams was reviewed with pt and they received a copy of it.

## 2021-05-01 ENCOUNTER — Encounter

## 2021-05-02 ENCOUNTER — Encounter

## 2021-05-24 MED ORDER — TIZANIDINE HCL 4 MG PO TABS
4 MG | ORAL_TABLET | ORAL | 2 refills | Status: DC
Start: 2021-05-24 — End: 2021-08-25

## 2021-05-24 NOTE — Telephone Encounter (Signed)
Last OV: 03/12/2021  Next OV: 06/10/2021

## 2021-06-03 ENCOUNTER — Inpatient Hospital Stay: Admit: 2021-06-03 | Payer: MEDICARE | Primary: Internal Medicine

## 2021-06-03 ENCOUNTER — Ambulatory Visit: Admit: 2021-06-03 | Discharge: 2021-06-03 | Payer: MEDICARE | Primary: Internal Medicine

## 2021-06-03 ENCOUNTER — Encounter: Primary: Internal Medicine

## 2021-06-03 DIAGNOSIS — E785 Hyperlipidemia, unspecified: Secondary | ICD-10-CM

## 2021-06-03 LAB — COMPREHENSIVE METABOLIC PANEL
ALT: 33 U/L (ref 16–61)
AST: 17 U/L (ref 10–38)
Albumin/Globulin Ratio: 1.1 (ref 0.8–1.7)
Albumin: 3.8 g/dL (ref 3.4–5.0)
Alk Phosphatase: 87 U/L (ref 45–117)
Anion Gap: 7 mmol/L (ref 3.0–18)
BUN: 13 MG/DL (ref 7.0–18)
Bun/Cre Ratio: 12 (ref 12–20)
CO2: 24 mmol/L (ref 21–32)
Calcium: 8.9 MG/DL (ref 8.5–10.1)
Chloride: 106 mmol/L (ref 100–111)
Creatinine: 1.11 MG/DL (ref 0.6–1.3)
Est, Glom Filt Rate: >60 mL/min/{1.73_m2} (ref >60)
Globulin: 3.4 g/dL (ref 2.0–4.0)
Glucose: 137 mg/dL — ABNORMAL HIGH (ref 74–99)
Potassium: 3.8 mmol/L (ref 3.5–5.5)
Sodium: 137 mmol/L (ref 136–145)
Total Bilirubin: 0.3 MG/DL (ref 0.2–1.0)
Total Protein: 7.2 g/dL (ref 6.4–8.2)

## 2021-06-03 LAB — LIPID PANEL
Chol/HDL Ratio: 3.8 (ref 0–5.0)
Cholesterol, Total: 136 MG/DL (ref <200)
HDL: 36 MG/DL — ABNORMAL LOW (ref 40–60)
LDL Calculated: 30 MG/DL (ref 0–100)
Triglycerides: 350 MG/DL — ABNORMAL HIGH (ref <150)
VLDL Cholesterol Calculated: 70 MG/DL

## 2021-06-10 ENCOUNTER — Ambulatory Visit: Admit: 2021-06-10 | Discharge: 2021-06-10 | Payer: MEDICARE | Attending: Internal Medicine | Primary: Internal Medicine

## 2021-06-10 ENCOUNTER — Encounter: Attending: Internal Medicine | Primary: Internal Medicine

## 2021-06-10 DIAGNOSIS — Z125 Encounter for screening for malignant neoplasm of prostate: Secondary | ICD-10-CM

## 2021-06-10 DIAGNOSIS — I10 Essential (primary) hypertension: Secondary | ICD-10-CM

## 2021-06-10 MED ORDER — OMEPRAZOLE 40 MG PO CPDR
40 MG | ORAL_CAPSULE | ORAL | 3 refills | Status: AC
Start: 2021-06-10 — End: 2021-12-23

## 2021-06-10 NOTE — Progress Notes (Signed)
Dean Walker is a 51 y.o.  male and presents with high, cholesterol, NHL, PVD      SUBJECTIVE:  Osteoarthritis and Chronic Pain:  Patient has osteoarthritis, primarily affecting the back.   Symptoms onset: problem is longstanding.  Rheumatological ROS: using Motrin and Zanaflex on a regular basis for pain control.  Lidoderm patch has also been helpful for him and he would like a prescription for this medication.  Response to treatment plan: pt had ETOH and marijuana in UDS so stopped  Norco and will not prescribe narcotic meds due high risk of abuse.  Patient continues to use marijuana for pain control.    Pt's GERD is well controlled Prilosec     Patient has a history of NHL.  Dean Walker first noticed a cough around April/2019 and had a 20lb weight loss around the same time due to decreased appetite. He presented to the ED on 08/09/17 for worsening SOB and a CTA chest was performed and revealed innumerable pulmonary nodules highly concerning for metastatic disease. 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 Bone marrow biopsy was negative. He completed 6 cycles of Adcetris, Doxorubicin, Cytoxan, and Prednisone without incident in October/2019. Post treatment PET scan in November/2019 revealed complete remission. He was referred to [facility] for consideration of autologous stem cell transplant but since he was in remission plan was to monitor q 3 months for now for 2 years.  Patient received his treatment in [place] but is now back in [place] and is seeing Dr Meryl Crutch.    Patient was having claudication and was referred to vascular surgery where he had intervention which showed occlusion of left external iliac artery.  He had angioplasty and stent of the left common and external iliac artery x3.  He remains on Plavix.  Due to history of peripheral artery disease we will start patient on Crestor 5 mg daily for his dyslipidemia.   Aim LDL <70.      Respiratory ROS: negative for - shortness of breath  Cardiovascular ROS: negative for - chest pain    Current Outpatient Medications   Medication Sig    omeprazole (PRILOSEC) 40 MG delayed release capsule TAKE 1 CAPSULE BY MOUTH DAILY    clopidogrel (PLAVIX) 75 MG tablet Take 75 mg by mouth daily    ibuprofen (ADVIL;MOTRIN) 800 MG tablet TAKE 1 TABLET BY MOUTH EVERY 8 HOURS AS NEEDED FOR PAIN    lidocaine (LIDODERM) 5 % Apply patch to the affected area for 12 hours a day and remove for 12 hours a day.    tiZANidine (ZANAFLEX) 4 MG tablet TAKE 1 TABLET BY MOUTH EVERY 6 HOURS AS NEEDED FOR PAIN    tiZANidine (ZANAFLEX) 4 MG tablet TAKE 1 TABLET BY MOUTH EVERY 6 HOURS AS NEEDED FOR PAIN    rosuvastatin (CRESTOR) 5 MG tablet Take 1 tablet by mouth daily     No current facility-administered medications for this visit.             OBJECTIVE:  alert, well appearing, and in no distress  Visit Vitals  BP 127/89 (Site: Right Upper Arm, Position: Sitting, Cuff Size: Small Adult)   Pulse 99   Temp 98.3 ??F (36.8 ??C) (Temporal)   Resp 20   Ht 5' 6"  (1.676 m)   Wt 170 lb (77.1 kg)   SpO2 99%   BMI 27.44 kg/m??       Chest -  clear to auscultation, no wheezes, rales or rhonchi, symmetric air entry  Heart - normal rate, regular rhythm, normal S1, S2, no murmurs, rubs, clicks or gallops  Extremities - peripheral pulses normal, no pedal edema, no clubbing or cyanosis    CMP:    Lab Results   Component Value Date/Time    NA 137 06/03/2021 11:54 AM    K 3.8 06/03/2021 11:54 AM    CL 106 06/03/2021 11:54 AM    CO2 24 06/03/2021 11:54 AM    BUN 13 06/03/2021 11:54 AM    CREATININE 1.11 06/03/2021 11:54 AM    GFRAA >60 11/25/2020 09:44 AM    AGRATIO 1.1 09/11/2020 11:20 AM    LABGLOM >60 06/03/2021 11:54 AM    GLUCOSE 137 06/03/2021 11:54 AM    PROT 7.2 06/03/2021 11:54 AM    LABALBU 3.8 06/03/2021 11:54 AM    CALCIUM 8.9 06/03/2021 11:54 AM    BILITOT 0.3 06/03/2021 11:54 AM    ALKPHOS 87 06/03/2021 11:54 AM    ALKPHOS 97  09/11/2020 11:20 AM    AST 17 06/03/2021 11:54 AM    ALT 33 06/03/2021 11:54 AM     FLP:    Lab Results   Component Value Date/Time    TRIG 350 06/03/2021 11:54 AM    HDL 36 06/03/2021 11:54 AM    LDLCALC 30 06/03/2021 11:54 AM    LABVLDL 70 06/03/2021 11:54 AM     PSA:   Lab Results   Component Value Date/Time    PSA 1.1 03/13/2020 11:50 AM               Assessment/Plan    ICD-10-CM    1. Essential (primary) hypertension  I10 Borderline controlled we will continue to monitor.  Comprehensive Metabolic Panel      2. Mixed hyperlipidemia  E78.2 well-controlled on Crestor 5 mg daily lipid Panel.  Patient to try and decrease triglycerides by cutting back sugar and starches in his diet      3. 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 Peripheral vascular disease, unspecified ([place])  I73.9 Management as per #3      5. 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      6. Prostate cancer screening  Z12.5 PSA Screening           Return in about 6 months (around 12/11/2021) for labs 1 week before.       Reviewed plan of care. Patient has provided input and agrees with goals.

## 2021-06-10 NOTE — Progress Notes (Signed)
Larey Brick presents today for   Chief Complaint   Patient presents with    Hypertension    Cholesterol Problem     3 month follow up           1. "Have you been to the ER, urgent care clinic since your last visit?  Hospitalized since your last visit?" no    2. "Have you seen or consulted any other health care providers outside of the [place] [facility] since your last visit?" Yes, Dr. Alyson Ingles Vascular, and Dr. Sula Rumple Hematology.     3. For patients aged 60-75: Has the patient had a colonoscopy / FIT/ Cologuard? Yes - no Care Gap present      If the patient is male:    4. For patients aged 66-74: Has the patient had a mammogram within the past 2 years? NA - based on age or sex      49. For patients aged 21-65: Has the patient had a pap smear? NA - based on age or sex

## 2021-06-11 MED ORDER — ROSUVASTATIN CALCIUM 5 MG PO TABS
5 MG | ORAL_TABLET | Freq: Every day | ORAL | 1 refills | Status: AC
Start: 2021-06-11 — End: 2021-12-23

## 2021-06-11 NOTE — Telephone Encounter (Addendum)
Dean Vear Clock, MD ,    Dean Walker has been flagged for adherence by [place]. I have pended refills for your approval.  Patient filled rosuvastatin 06/09/2021- but has 0 refills remaining and will need refills to get to next office visit.  2 refills remain on clopidogrel    LOV:06/10/2021  NOV:12/18/21    Thank you,  Norval Morton, PharmD, [place]   Department, toll free: [phone] Option 2    Requested Prescriptions     Pending Prescriptions Disp Refills    rosuvastatin (CRESTOR) 5 MG tablet 90 tablet 1     Sig: Take 1 tablet by mouth daily      ================================================================================    POPULATION HEALTH CLINICAL PHARMACY: STATIN THERAPY REVIEW  Identified statin use in persons with diabetes care gap per [place]. Records dated: 06/11/2021.     Last Visit: 06/10/21  Next Visit: 12/18/21    Per insurer report, LIS-0 - co-pays are based on tiers and patient is subject to coverage gap.      ASSESSMENT of Statin in Persons with Cardiovascular Disease Care Gap    Dean Walker  has been identified as having a diagnosis for clinical ASCVD or event (e.g., inpatient hospitalization for MI, CABG, PCI or other revascularization procedures) in the measurement year and not currently filling a moderate or high intensity statin.   Patients included in this care gap are males age 65-75 and females age 9-75.     ASCVD: Coronary or other Arterial Revascularization  Currently Prescribed a statin: yes - Rosuvastatin 5 mg  Per Insurer Portal: last filled on 03/23/21 for 90 day supply.   Per [facility]: last filled on 06/09/2021 for 90 day supply and READY to pick up  Billed through [place]     Per chart review: Gap Closed: Rosuvastatin 5 mg  filled 06/09/2021. Report data before patient filled rx.  Confirmed through precheck my script and with [facility]- not yet in Insurance portal       OV 06/10/2021 Dr Madelyn Brunner:  "Patient  was having claudication and was referred to vascular surgery where he had intervention which showed occlusion of left external iliac artery.  He had angioplasty and stent of the left common and external iliac artery x3.  He remains on Plavix.  Due to history of peripheral artery disease we will start patient on Crestor 5 mg daily for his dyslipidemia.  In his LDL <70."  Allergies   Allergen Reactions    Tramadol Nausea Only     SEVERE NAUSEA  SEVERE NAUSEA    Gabapentin Other (See Comments)     Caused foot pain       Lab Results   Component Value Date    CHOL 136 06/03/2021    TRIG 350 (H) 06/03/2021    HDL 36 (L) 06/03/2021    LDLCALC 30 06/03/2021     ALT   Date Value Ref Range Status   06/03/2021 33 16 - 61 U/L Final     AST   Date Value Ref Range Status   06/03/2021 17 10 - 38 U/L Final       The 10-year ASCVD risk score (Arnett DK, et al., 2019) is: 5.4%    Values used to calculate the score:      Age: 51 years      Sex: Male      Is Non-Hispanic African American: Yes      Diabetic: No  Tobacco smoker: No      Systolic Blood Pressure: 176 mmHg      Is BP treated: No      HDL Cholesterol: 36 MG/DL      Total Cholesterol: 136 MG/DL     Per 2018 ACC/AHA Guidelines  the patient is a candidate for a moderate-intensity statin.    2018 ACC/AHA Guidelines:  28-52 yo Male Patient [id number] ASCVD  LDL Target goal: <55 mg/dL      Patient also appears to be prescribed: none    Assessment of Adherence Metrics    [facility] Records claims through  06/11/2021  (Prior Year [place] = not reported; YTD [place] = NA):   Rosuvastatin 5 mg  last filled on 06/09/2021 for 90 day supply. Next refill due: 09/07/2021    Prescribed sig: 1 tablet/capsule daily    Per Reconcile Dispense History: last filled on 06/09/2021 for 90 day supply.     Per [facility]: last filled on 06/09/2021 for 90 day supply and READY to pick up  Billed through [employer]   0 refills remaining    Lab Results   Component Value Date    CHOL 136  06/03/2021    TRIG 350 (H) 06/03/2021    HDL 36 (L) 06/03/2021    LDLCALC 30 06/03/2021     ALT   Date Value Ref Range Status   06/03/2021 33 16 - 61 U/L Final     AST   Date Value Ref Range Status   06/03/2021 17 10 - 38 U/L Final     The 10-year ASCVD risk score (Arnett DK, et al., 2019) is: 5.4%    Values used to calculate the score:      Age: 51 years      Sex: Male      Is Non-Hispanic African American: Yes      Diabetic: No      Tobacco smoker: No      Systolic Blood Pressure: 948 mmHg      Is BP treated: No      HDL Cholesterol: 36 MG/DL      Total Cholesterol: 136 MG/DL     PLAN  The following are interventions that have been identified:  Patient Gap closed for SPC- filled rosuvastatin Mod/High   - Rosuvastatin will need refills for next time- confirmed billed through bin [id number] and ID [id number]    No patient out reach planned at this time.    Norval Morton  PharmD, [place]  Department, toll free: [phone], option 2       For Pharmacy Admin Tracking Only    Program: [place] in place:  No  Recommendation Provided To: Provider: 1 via Note to Provider  Intervention Detail: Adherence Monitoring: 1 and New Rx: 1, reason: Improve Adherence  Intervention Accepted By: Provider: 1  Gap Closed?: Yes   Time Spent (min): 15

## 2021-07-02 NOTE — Progress Notes (Signed)
PRE-SURGICAL INSTRUCTIONS        Patient's Name:  Dean Walker      PJASN'K Date:  07/02/2021                Surgery Date:07/13/2021                Do NOT eat or drink anything, including candy, gum, or ice chips after midnight on 07/13/2021, unless you have specific instructions from your surgeon or anesthesia provider to do so.  You may brush your teeth before coming to the hospital.  No smoking 24 hours prior to the day of surgery.  No alcohol 24 hours prior to the day of surgery.  No recreational drugs for one week prior to the day of surgery.  Leave all valuables, including money/purse, at home.  Remove all jewelry, nail polish, acrylic nails, and makeup (including mascara); no lotions powders, deodorant, or perfume/cologne/after shave on the skin.  Follow instruction for Hibiclens washes and CHG wipes from surgeon's office.   Glasses/contact lenses and dentures may be worn to the hospital.  They will be removed prior to surgery.  Call your doctor if symptoms of a cold or illness develop within 24-48 hours prior to your surgery.  11.  If you are having an outpatient procedure, please make arrangements for a responsible ADULT TO [place] and stay with you for 24 hours after your surgery.  12.  ONE VISITOR in the hospital at this time for outpatient procedures.  Exceptions may be made for surgical admissions, per nursing unit guidelines      Special Instructions:      Bring list of CURRENT medications.    Bring any pertinent legal medical records.  Take these medications the morning of surgery with a sip of water as instructed by office.  Follow physician instructions about stopping anticoagulants.  Complete bowel prep per MD instructions.       On the day of surgery, come in the main entrance of [facility].  Let the security guard at the desk know you are there for surgery.  A staff member will come escort you to the surgical area on the second floor.    If you have any  questions or concerns, please do not hesitate to call:     (Prior to the day of surgery) PAT department:  [phone]   (Day of surgery) Pre-Op department:  [phone]    These surgical instructions were reviewed with [name] during the PAT phone call.

## 2021-07-29 ENCOUNTER — Ambulatory Visit: Admit: 2021-07-29 | Discharge: 2021-07-29 | Payer: MEDICARE | Attending: Internal Medicine | Primary: Internal Medicine

## 2021-07-29 DIAGNOSIS — L989 Disorder of the skin and subcutaneous tissue, unspecified: Secondary | ICD-10-CM

## 2021-07-29 NOTE — Progress Notes (Signed)
Dean Walker presents today for   Chief Complaint   Patient presents with    Skin Problem     On neck and back     1. "Have you been to the ER, urgent care clinic since your last visit?  Hospitalized since your last visit?" no    2. "Have you seen or consulted any other health care providers outside of the [place] since your last visit?" no     3. For patients aged 52-75: Has the patient had a colonoscopy / FIT/ Cologuard? Yes - no Care Gap present      If the patient is male:    4. For patients aged 50-74: Has the patient had a mammogram within the past 2 years? NA - based on age or sex      75. For patients aged 52-65: Has the patient had a pap smear? NA - based on age or sex

## 2021-08-01 NOTE — Progress Notes (Signed)
Dean Walker (DOB:  1970-11-24) is a 51 y.o. male,Established patient, here for evaluation of the following chief complaint(s):  Skin Problem (On right neck and right lowerback f/u pariesr dermatol)         ASSESSMENT/PLAN:    ICD-10-CM    1. Skin lesion of neck  L98.9 Refer to dermatology for further evaluation.  We will refer to plastic surgery if patient cannot be managed by dermatology      2. Skin lesion of back  L98.9 Same as above             Return if symptoms worsen or fail to improve.         Subjective   SUBJECTIVE/OBJECTIVE:  Patient over the last month has developed a large about 2 cm circular thick skin lesion on his right lower jaw which he thought was an infected hair bump however he was treated with antibiotics by his oncologist and has not noticed any improvement in the lesion.  He also has similar lesions larger in size on his right lower back.  Lesions currently now do not show any erythema and he denies any pain with the lesions.  Patient does have a history of lymphoma and will be concerned if this was a cutaneous manifestation of it.  Either way patient needs a biopsy and we will go ahead and refer to dermatology for further evaluation of this.    Skin Problem  Pertinent negatives include no fever.     Review of Systems   Constitutional:  Negative for chills and fever.        Objective   Physical Exam  Skin:     Findings: Rash present. Rash is nodular.             Comments: Sick hyperkeratotic circular skin lesions      Visit Vitals  BP (!) 135/91 (Site: Right Upper Arm, Position: Sitting, Cuff Size: Small Adult)   Pulse 87   Temp 98.7 F (37.1 C) (Temporal)   Resp 20   Ht '5\' 6"'$  (1.676 m)   Wt 171 lb (77.6 kg)   SpO2 100%   BMI 27.60 kg/m               An electronic signature was used to authenticate this note.    --Dajohn Ellender Vear Clock, MD

## 2021-08-25 MED ORDER — TIZANIDINE HCL 4 MG PO TABS
4 MG | ORAL_TABLET | ORAL | 2 refills | Status: AC
Start: 2021-08-25 — End: 2021-12-23

## 2021-08-25 NOTE — Telephone Encounter (Signed)
Last OV: 07/29/2021  Next OV: 12/23/2021  Last refill: 05/24/2021

## 2021-12-16 ENCOUNTER — Ambulatory Visit: Payer: MEDICARE | Primary: Internal Medicine

## 2021-12-23 ENCOUNTER — Ambulatory Visit: Admit: 2021-12-23 | Discharge: 2021-12-23 | Payer: MEDICARE | Attending: Internal Medicine | Primary: Internal Medicine

## 2021-12-23 DIAGNOSIS — I10 Essential (primary) hypertension: Secondary | ICD-10-CM

## 2021-12-23 MED ORDER — TIZANIDINE HCL 4 MG PO TABS
4 MG | ORAL_TABLET | ORAL | 2 refills | Status: AC
Start: 2021-12-23 — End: 2022-08-22

## 2021-12-23 MED ORDER — ROSUVASTATIN CALCIUM 5 MG PO TABS
5 MG | ORAL_TABLET | Freq: Every day | ORAL | 2 refills | Status: DC
Start: 2021-12-23 — End: 2022-09-15

## 2021-12-23 MED ORDER — CLOPIDOGREL BISULFATE 75 MG PO TABS
75 MG | ORAL_TABLET | Freq: Every day | ORAL | 5 refills | Status: DC
Start: 2021-12-23 — End: 2022-06-22

## 2021-12-23 MED ORDER — OMEPRAZOLE 40 MG PO CPDR
40 MG | ORAL_CAPSULE | Freq: Every day | ORAL | 2 refills | Status: DC
Start: 2021-12-23 — End: 2022-09-15

## 2021-12-23 NOTE — Telephone Encounter (Signed)
-----   Message from Thana Farr, MD sent at 12/23/2021 10:57 AM EDT -----  Pt was referred to [facility] for colonoscopy and had consult. Please get copy if done. If not scheduled will refer back to them

## 2021-12-23 NOTE — Progress Notes (Signed)
Dean Walker presents today for   Chief Complaint   Patient presents with    Hypertension    Cholesterol Problem     6 month follow up          1. "Have you been to the ER, urgent care clinic since your last visit?  Hospitalized since your last visit?" no    2. "Have you seen or consulted any other health care providers outside of the [place] since your last visit?" no     3. For patients aged 52-75: Has the patient had a colonoscopy / FIT/ Cologuard? Yes - no Care Gap present      If the patient is male:    4. For patients aged 29-74: Has the patient had a mammogram within the past 2 years? NA - based on age or sex      20. For patients aged 21-65: Has the patient had a pap smear? NA - based on age or sex

## 2021-12-23 NOTE — Progress Notes (Signed)
Dean Walker is a 51 y.o.  male and presents with high, cholesterol, NHL, PVD      SUBJECTIVE:  Osteoarthritis and Chronic Pain:  Patient has osteoarthritis, primarily affecting the back.   Symptoms onset: problem is longstanding.  Rheumatological ROS: using Motrin and Zanaflex on a regular basis for pain control.  Lidoderm patches also help.  Response to treatment plan: pt had ETOH and marijuana in [place] so stopped  Norco and will not prescribe narcotic meds due high risk of abuse.  Patient continues to use marijuana for pain control.    Pt's GERD is well controlled Prilosec     Patient has a history of NHL.  Dean Walker first noticed a cough around April/2019 and had a 20lb weight loss around the same time due to decreased appetite. He presented to the ED on 08/09/17 for worsening SOB and a CTA chest was performed and revealed innumerable pulmonary nodules highly concerning for metastatic disease. 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 Bone marrow biopsy was negative. He completed 6 cycles of Adcetris, Doxorubicin, Cytoxan, and Prednisone without incident in October/2019. Post treatment PET scan in November/2019 revealed complete remission. He was referred to [facility] for consideration of autologous stem cell transplant but since he was in remission plan was to monitor q 3 months for now for 2 years.  Patient received his treatment in [place] but is now back in [place] and is seeing Dr Meryl Crutch.    Patient was having claudication and was referred to vascular surgery where he had intervention which showed occlusion of left external iliac artery.  He had angioplasty and stent of the left common and external iliac artery x3.  He remains on Plavix.  Due to history of peripheral artery disease he is  on Crestor 5 mg daily for his dyslipidemia.  Aim LDL <70.  Patient to have lab work done in the next week at  [facility].      Respiratory ROS: negative for - shortness of breath  Cardiovascular ROS: negative for - chest pain    Current Outpatient Medications   Medication Sig    tiZANidine (ZANAFLEX) 4 MG tablet TAKE 1 TABLET BY MOUTH EVERY 6 HOURS AS NEEDED FOR PAIN    clopidogrel (PLAVIX) 75 MG tablet Take 1 tablet by mouth daily    omeprazole (PRILOSEC) 40 MG delayed release capsule Take 1 capsule by mouth daily    rosuvastatin (CRESTOR) 5 MG tablet Take 1 tablet by mouth daily    ibuprofen (ADVIL;MOTRIN) 800 MG tablet TAKE 1 TABLET BY MOUTH EVERY 8 HOURS AS NEEDED FOR PAIN    lidocaine (LIDODERM) 5 % Apply patch to the affected area for 12 hours a day and remove for 12 hours a day. (Patient not taking: Reported on 12/23/2021)     No current facility-administered medications for this visit.             OBJECTIVE:  alert, well appearing, and in no distress  Visit Vitals  BP 121/77 (Site: Left Upper Arm, Position: Sitting, Cuff Size: Small Adult)   Pulse 96   Temp 97.9 F (36.6 C) (Temporal)   Resp 20   Ht 5' 6"  (1.676 m)   Wt 154 lb (69.9 kg)   SpO2 99%   BMI 24.86 kg/m       Chest - clear to auscultation, no wheezes, rales or rhonchi, symmetric air entry  Heart - normal  rate, regular rhythm, normal S1, S2, no murmurs, rubs, clicks or gallops  Extremities - peripheral pulses normal, no pedal edema, no clubbing or cyanosis    CMP:    Lab Results   Component Value Date/Time    NA 137 06/03/2021 11:54 AM    K 3.8 06/03/2021 11:54 AM    CL 106 06/03/2021 11:54 AM    CO2 24 06/03/2021 11:54 AM    BUN 13 06/03/2021 11:54 AM    CREATININE 1.11 06/03/2021 11:54 AM    GFRAA >60 11/25/2020 09:44 AM    AGRATIO 1.1 09/11/2020 11:20 AM    LABGLOM >60 06/03/2021 11:54 AM    GLUCOSE 137 06/03/2021 11:54 AM    PROT 7.2 06/03/2021 11:54 AM    LABALBU 3.8 06/03/2021 11:54 AM    CALCIUM 8.9 06/03/2021 11:54 AM    BILITOT 0.3 06/03/2021 11:54 AM    ALKPHOS 87 06/03/2021 11:54 AM    ALKPHOS 97 09/11/2020 11:20 AM    AST 17 06/03/2021 11:54  AM    ALT 33 06/03/2021 11:54 AM     FLP:    Lab Results   Component Value Date/Time    TRIG 350 06/03/2021 11:54 AM    HDL 36 06/03/2021 11:54 AM    LDLCALC 30 06/03/2021 11:54 AM    LABVLDL 70 06/03/2021 11:54 AM     PSA:   Lab Results   Component Value Date/Time    PSA 1.1 03/13/2020 11:50 AM               Assessment/Plan    ICD-10-CM    1. Essential (primary) hypertension  I10 Fairly well-controlled with diet we will continue to monitor Comprehensive Metabolic Panel      2. Mixed hyperlipidemia  E78.2 Status unknown.  We will continue Crestor 5 mg and we will check lipid Panel      3. 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 Peripheral vascular disease, unspecified ([place])  I73.9 We will treat with statin and Plavix and patient follow-up with vascular surgery      5. 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 Prostate cancer screening  Z12.5 PSA Screening         HM-patient was referred for colonoscopy and actually had a consultation.  Do not see that he had x-rays test will follow-up with [facility]    Return in about 6 months (around 06/23/2022) for labs to be done at [facility].       Reviewed plan of care. Patient has provided input and agrees with goals.

## 2021-12-23 NOTE — Telephone Encounter (Signed)
Patient has colonoscopy scheduled for November 16,2023.

## 2022-01-05 ENCOUNTER — Inpatient Hospital Stay: Admit: 2022-01-05 | Payer: MEDICARE | Primary: Internal Medicine

## 2022-01-05 DIAGNOSIS — Z125 Encounter for screening for malignant neoplasm of prostate: Secondary | ICD-10-CM

## 2022-01-05 DIAGNOSIS — E782 Mixed hyperlipidemia: Secondary | ICD-10-CM

## 2022-01-05 LAB — COMPREHENSIVE METABOLIC PANEL
ALT: 34 U/L (ref 16–61)
AST: 23 U/L (ref 10–38)
Albumin/Globulin Ratio: 0.9 (ref 0.8–1.7)
Albumin: 3.4 g/dL (ref 3.4–5.0)
Alk Phosphatase: 98 U/L (ref 45–117)
Anion Gap: 4 mmol/L (ref 3.0–18)
BUN: 11 MG/DL (ref 7.0–18)
Bun/Cre Ratio: 9 — ABNORMAL LOW (ref 12–20)
CO2: 26 mmol/L (ref 21–32)
Calcium: 9.1 MG/DL (ref 8.5–10.1)
Chloride: 106 mmol/L (ref 100–111)
Creatinine: 1.21 MG/DL (ref 0.6–1.3)
Est, Glom Filt Rate: >60 mL/min/{1.73_m2} (ref >60)
Globulin: 3.7 g/dL (ref 2.0–4.0)
Glucose: 96 mg/dL (ref 74–99)
Potassium: 4.4 mmol/L (ref 3.5–5.5)
Sodium: 136 mmol/L (ref 136–145)
Total Bilirubin: 0.4 MG/DL (ref 0.2–1.0)
Total Protein: 7.1 g/dL (ref 6.4–8.2)

## 2022-01-05 LAB — LIPID PANEL
Chol/HDL Ratio: 2.4 (ref 0–5.0)
Cholesterol, Total: 142 MG/DL (ref <200)
HDL: 60 MG/DL (ref 40–60)
LDL Calculated: 66.4 MG/DL (ref 0–100)
Triglycerides: 78 MG/DL (ref <150)
VLDL Cholesterol Calculated: 15.6 MG/DL

## 2022-01-05 LAB — PSA SCREENING: PSA: 1.5 ng/mL (ref 0.0–4.0)

## 2022-01-11 MED ORDER — IBUPROFEN 800 MG PO TABS
800 MG | ORAL_TABLET | ORAL | 1 refills | Status: DC
Start: 2022-01-11 — End: 2022-05-13

## 2022-01-11 NOTE — Telephone Encounter (Signed)
LOV:12/23/21    NOV: 06/23/22    Last Refill: 01/28/21

## 2022-04-06 NOTE — Progress Notes (Signed)
Email for [facility] left 4 messages and 2 attempts patient was unable to speak on the phone.

## 2022-05-13 MED ORDER — IBUPROFEN 800 MG PO TABS
800 MG | ORAL_TABLET | ORAL | 1 refills | Status: DC
Start: 2022-05-13 — End: 2023-02-01

## 2022-05-13 NOTE — Telephone Encounter (Signed)
Last ov:12/23/2021  Next ov:06/23/2022  Last refill:01/11/2022

## 2022-06-22 MED ORDER — CLOPIDOGREL BISULFATE 75 MG PO TABS
75 MG | ORAL_TABLET | Freq: Every day | ORAL | 5 refills | Status: DC
Start: 2022-06-22 — End: 2023-07-12

## 2022-06-22 NOTE — Telephone Encounter (Signed)
Last OV:12/23/2021  Next OV: 06/23/2022  Last refill: 12/23/2021

## 2022-06-23 ENCOUNTER — Ambulatory Visit: Payer: MEDICARE | Attending: Internal Medicine | Primary: Internal Medicine

## 2022-07-19 ENCOUNTER — Ambulatory Visit
Admit: 2022-07-19 | Discharge: 2022-07-19 | Payer: PRIVATE HEALTH INSURANCE | Attending: Internal Medicine | Primary: Internal Medicine

## 2022-07-19 DIAGNOSIS — I10 Essential (primary) hypertension: Secondary | ICD-10-CM

## 2022-07-19 NOTE — Progress Notes (Signed)
Dean Walker presents today for   Chief Complaint   Patient presents with    Hypertension    Cholesterol Problem     Follow up           "Have you been to the ER, urgent care clinic since your last visit?  Hospitalized since your last visit?"    NO    "Have you seen or consulted any other health care providers outside of [facility] since your last visit?"    NO    "Have you had a colorectal cancer screening such as a colonoscopy/FIT/Cologuard?    NO-- patient not ready to have procedure at this time.     No colonoscopy on file  No cologuard on file  No FIT/FOBT on file   No flexible sigmoidoscopy on file

## 2022-07-19 NOTE — Progress Notes (Signed)
Dean Walker is a 52 y.o.  male and presents with high, cholesterol, NHL, PVD      SUBJECTIVE:  Osteoarthritis and Chronic Pain:  Patient has osteoarthritis, primarily affecting the back.   Symptoms onset: problem is longstanding.  Rheumatological ROS: using Motrin and Zanaflex on a regular basis for pain control.  Lidoderm patches also help.  Response to treatment plan: pt had ETOH and marijuana in [place] so stopped  Norco and will not prescribe narcotic meds due high risk of abuse.  Patient continues to use marijuana for pain control.    Pt's GERD is well controlled Prilosec     Patient has a history of NHL.  Mr. Dean Walker first noticed a cough around April/2019 and had a 20lb weight loss around the same time due to decreased appetite. He presented to the ED on 08/09/17 for worsening SOB and a CTA chest was performed and revealed innumerable pulmonary nodules highly concerning for metastatic disease. 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 Bone marrow biopsy was negative. He completed 6 cycles of Adcetris, Doxorubicin, Cytoxan, and Prednisone without incident in October/2019. Post treatment PET scan in November/2019 revealed complete remission. He was referred to [facility] for consideration of autologous stem cell transplant but since he was in remission plan was to monitor q 3 months for now for 2 years.  Patient received his treatment in [facility] [place] but is now back in [place] and is seeing Dr Fidela Juneau.    Patient was having claudication and was referred to vascular surgery where he had intervention which showed occlusion of left external iliac artery.  He had angioplasty and stent of the left common and external iliac artery x3.  He remains on Plavix.  Due to history of peripheral artery disease he is  on Crestor 5 mg daily for his dyslipidemia.  Aim LDL <70.  Patient to have lab work done before next OV      Respiratory ROS:  negative for - shortness of breath  Cardiovascular ROS: negative for - chest pain    Current Outpatient Medications   Medication Sig    clopidogrel (PLAVIX) 75 MG tablet TAKE 1 TABLET BY MOUTH DAILY    ibuprofen (ADVIL;MOTRIN) 800 MG tablet TAKE 1 TABLET BY MOUTH EVERY 8 HOURS AS NEEDED FOR PAIN    tiZANidine (ZANAFLEX) 4 MG tablet TAKE 1 TABLET BY MOUTH EVERY 6 HOURS AS NEEDED FOR PAIN    omeprazole (PRILOSEC) 40 MG delayed release capsule Take 1 capsule by mouth daily    rosuvastatin (CRESTOR) 5 MG tablet Take 1 tablet by mouth daily    lidocaine (LIDODERM) 5 % Apply patch to the affected area for 12 hours a day and remove for 12 hours a day. (Patient not taking: Reported on 12/23/2021)     No current facility-administered medications for this visit.             OBJECTIVE:  alert, well appearing, and in no distress  Visit Vitals  BP 126/86 (Site: Left Upper Arm, Position: Sitting, Cuff Size: Large Adult)   Pulse 100   Temp 97.6 F (36.4 C) (Oral)   Resp 16   Ht 1.676 m ( )   Wt 68.5 kg (151 lb)   SpO2 100%   BMI 24.37 kg/m       Chest - clear to auscultation, no wheezes, rales or rhonchi, symmetric air entry  MYLIK PROrate, regular rhythm,  normal S1, S2, no murmurs, rubs, clicks or gallops  Extremities - peripheral pulses normal, no pedal edema, no clubbing or cyanosis    CMP:    Lab Results   Component Value Date/Time    NA 136 01/05/2022 09:20 AM    K 4.4 01/05/2022 09:20 AM    CL 106 01/05/2022 09:20 AM    CO2 26 01/05/2022 09:20 AM    BUN 11 01/05/2022 09:20 AM    CREATININE 1.21 01/05/2022 09:20 AM    GFRAA >60 11/25/2020 09:44 AM    AGRATIO 0.9 01/05/2022 09:20 AM    AGRATIO 1.1 09/11/2020 11:20 AM    LABGLOM >60 01/05/2022 09:20 AM    GLUCOSE 96 01/05/2022 09:20 AM    PROT 7.1 01/05/2022 09:20 AM    CALCIUM 9.1 01/05/2022 09:20 AM    BILITOT 0.4 01/05/2022 09:20 AM    ALKPHOS 98 01/05/2022 09:20 AM    ALKPHOS 97 09/11/2020 11:20 AM    AST 23 01/05/2022 09:20 AM    ALT 34 01/05/2022 09:20 AM      FLP:    Lab Results   Component Value Date/Time    TRIG 78 01/05/2022 09:20 AM    HDL 60 01/05/2022 09:20 AM    LDLCALC 66.4 01/05/2022 09:20 AM     PSA:   Lab Results   Component Value Date/Time    PSA 1.5 01/05/2022 09:20 AM    PSA 1.1 03/13/2020 11:50 AM               Assessment/Plan    ICD-10-CM    1. Essential (primary) hypertension  I10 Well-controlled currently off of medications Comprehensive Metabolic Panel      2. Mixed hyperlipidemia  E78.2 Has been controlled on Crestor 5 mg daily.  Get updated blood work before next office visit lipid Panel      3. 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 Peripheral vascular disease, unspecified (HCC)  I73.9 Patient managed by vascular surgery  Plavix      5. 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 Colon cancer screening  Z12.11 Cologuard (Fecal DNA Colorectal Cancer Screening)      7. Vitamin D deficiency  E55.9 Patient advised we will check Vitamin D 25 Hydroxy         HM-patient is declining to do colonoscopy he said he did Cologuard several years ago.  He is willing to try Cologuard again    Return in about 6 months (around 01/18/2023) for labs 1 week before.       Reviewed plan of care. Patient has provided input and agrees with goals.

## 2022-08-22 MED ORDER — TIZANIDINE HCL 4 MG PO TABS
4 MG | ORAL_TABLET | ORAL | 2 refills | Status: DC
Start: 2022-08-22 — End: 2023-01-08

## 2022-09-15 ENCOUNTER — Telehealth

## 2022-09-15 MED ORDER — ROSUVASTATIN CALCIUM 5 MG PO TABS
5 MG | ORAL_TABLET | Freq: Every day | ORAL | 2 refills | Status: AC
Start: 2022-09-15 — End: 2023-01-08

## 2022-09-15 MED ORDER — OMEPRAZOLE 40 MG PO CPDR
40 MG | ORAL_CAPSULE | Freq: Every day | ORAL | 2 refills | Status: AC
Start: 2022-09-15 — End: 2023-01-08

## 2022-09-15 NOTE — Telephone Encounter (Signed)
Patient scheduled.

## 2022-09-15 NOTE — Telephone Encounter (Signed)
Please schedule appointment for labs.

## 2022-09-15 NOTE — Telephone Encounter (Signed)
Pt would like to be tested for STD's. Please advise if patient needs an OV or lab only?

## 2022-09-15 NOTE — Telephone Encounter (Signed)
Labs ordered.

## 2022-09-16 NOTE — Telephone Encounter (Signed)
Per [facility].  All labs are no longer doing T.pallidum Ab.

## 2022-09-20 ENCOUNTER — Encounter: Admit: 2022-09-20 | Discharge: 2022-09-20 | Payer: PRIVATE HEALTH INSURANCE | Primary: Internal Medicine

## 2022-09-20 ENCOUNTER — Inpatient Hospital Stay: Admit: 2022-09-20 | Payer: PRIVATE HEALTH INSURANCE | Primary: Internal Medicine

## 2022-09-20 DIAGNOSIS — Z113 Encounter for screening for infections with a predominantly sexual mode of transmission: Secondary | ICD-10-CM

## 2022-09-20 NOTE — Other (Signed)
Please notify patient that their Lab tests for STDs are all normal

## 2022-09-21 LAB — HEPATITIS C ANTIBODY
Hep C Ab Interp: NEGATIVE
Hepatitis C Ab: 0.03 Index (ref <0.80)

## 2022-09-21 LAB — HIV 1/2 AG/AB, 4TH GENERATION,W RFLX CONFIRM: HIV 1/2 Interp: NONREACTIVE

## 2022-09-21 LAB — CHLAMYDIA, GONORRHEA, TRICHOMONIASIS
Chlamydia trachomatis, NAA: NEGATIVE
Neisseria Gonorrhoeae, NAA: NEGATIVE
Trichomonas Vaginalis by NAA: NEGATIVE

## 2022-09-22 LAB — RPR: RPR: NONREACTIVE

## 2022-09-22 NOTE — Telephone Encounter (Signed)
-----   Message from Jenel Lucks Amor sent at 09/22/2022 11:51 AM EDT -----  Regarding: ECC Results Request  ECC Results Request    Which lab or imaging result is the patient calling about: lab test    Which provider ordered the test?Dr. Marvia Pickles      Was this a Non-[facility] Provider: No    Date the test was preformed (MM/DD/YYYY): 09/20/2022  --------------------------------------------------------------------------------------------------------------------------    Relationship to Patient: Self     Call Back Info: OK to leave message on voicemail  Preferred Call Back Number: Phone [phone]

## 2022-09-22 NOTE — Telephone Encounter (Signed)
Pt notified of results

## 2022-09-22 NOTE — Telephone Encounter (Signed)
    Patient calling for lab results 

## 2022-09-22 NOTE — Telephone Encounter (Signed)
All STD results are negative

## 2022-09-26 NOTE — Telephone Encounter (Signed)
Patient is aware 

## 2022-09-26 NOTE — Telephone Encounter (Signed)
-----   Message from Marvia Pickles, MD sent at 09/24/2022  4:09 PM EDT -----  Please notify patient that their Lab tests for STDs are all normal

## 2023-01-08 MED ORDER — OMEPRAZOLE 40 MG PO CPDR
40 | ORAL_CAPSULE | Freq: Every day | ORAL | 2 refills | Status: DC
Start: 2023-01-08 — End: 2023-10-04

## 2023-01-08 MED ORDER — ROSUVASTATIN CALCIUM 5 MG PO TABS
5 | ORAL_TABLET | Freq: Every day | ORAL | 2 refills | Status: AC
Start: 2023-01-08 — End: ?

## 2023-01-08 MED ORDER — TIZANIDINE HCL 4 MG PO TABS
4 MG | ORAL_TABLET | ORAL | 2 refills | Status: DC
Start: 2023-01-08 — End: 2023-02-01

## 2023-01-09 ENCOUNTER — Encounter: Payer: Self-pay | Admitting: Hematology

## 2023-01-09 NOTE — Telephone Encounter (Signed)
error 

## 2023-01-18 ENCOUNTER — Encounter: Payer: MEDICARE | Primary: Internal Medicine

## 2023-01-25 ENCOUNTER — Encounter: Payer: MEDICARE | Attending: Internal Medicine | Primary: Internal Medicine

## 2023-02-02 MED ORDER — IBUPROFEN 800 MG PO TABS
800 MG | ORAL_TABLET | ORAL | 3 refills | Status: DC
Start: 2023-02-02 — End: 2023-07-12

## 2023-02-02 MED ORDER — TIZANIDINE HCL 4 MG PO TABS
4 MG | ORAL_TABLET | ORAL | 3 refills | Status: DC
Start: 2023-02-02 — End: 2023-07-12

## 2023-02-15 ENCOUNTER — Encounter: Payer: Self-pay | Admitting: Hematology

## 2023-02-15 NOTE — Telephone Encounter (Signed)
Telephone call  

## 2023-03-06 ENCOUNTER — Encounter: Payer: MEDICARE | Primary: Internal Medicine

## 2023-03-13 ENCOUNTER — Encounter: Payer: MEDICARE | Attending: Internal Medicine | Primary: Internal Medicine

## 2023-04-05 ENCOUNTER — Encounter: Payer: MEDICARE | Attending: Internal Medicine | Primary: Internal Medicine

## 2023-04-19 ENCOUNTER — Encounter: Payer: MEDICARE | Attending: Internal Medicine | Primary: Internal Medicine

## 2023-04-25 ENCOUNTER — Encounter: Payer: MEDICARE | Attending: Internal Medicine | Primary: Internal Medicine

## 2023-05-09 ENCOUNTER — Encounter: Payer: PRIVATE HEALTH INSURANCE | Attending: Internal Medicine | Primary: Internal Medicine

## 2023-05-14 ENCOUNTER — Inpatient Hospital Stay: Admit: 2023-05-14 | Discharge: 2023-05-14 | Disposition: A | Discharge status: Home / Self Care | Payer: MEDICARE

## 2023-05-14 DIAGNOSIS — R7989 Other specified abnormal findings of blood chemistry: Secondary | ICD-10-CM

## 2023-05-14 LAB — COMPREHENSIVE METABOLIC PANEL
ALT: 28 U/L (ref 16–61)
AST: 14 U/L (ref 10–38)
Albumin/Globulin Ratio: 0.4 — ABNORMAL LOW (ref 0.8–1.7)
Albumin: 2.1 g/dL — ABNORMAL LOW (ref 3.4–5.0)
Alk Phosphatase: 167 U/L — ABNORMAL HIGH (ref 45–117)
Anion Gap: 8 mmol/L (ref 3.0–18)
BUN/Creatinine Ratio: 8 — ABNORMAL LOW (ref 12–20)
BUN: 9 mg/dL (ref 7.0–18)
CO2: 21 mmol/L (ref 21–32)
Calcium: 9.3 mg/dL (ref 8.5–10.1)
Chloride: 104 mmol/L (ref 100–111)
Creatinine: 1.08 mg/dL (ref 0.6–1.3)
Est, Glom Filt Rate: 82 mL/min/{1.73_m2} (ref >60)
Globulin: 5.4 g/dL — ABNORMAL HIGH (ref 2.0–4.0)
Glucose: 104 mg/dL — ABNORMAL HIGH (ref 74–99)
Potassium: 3.7 mmol/L (ref 3.5–5.5)
Sodium: 133 mmol/L — ABNORMAL LOW (ref 136–145)
Total Bilirubin: 0.2 mg/dL (ref 0.2–1.0)
Total Protein: 7.5 g/dL (ref 6.4–8.2)

## 2023-05-14 LAB — CBC WITH AUTO DIFFERENTIAL
Basophils %: 0 % (ref 0.0–2.0)
Basophils Absolute: 0 10*3/uL (ref 0.00–0.10)
Eosinophils %: 7 % — ABNORMAL HIGH (ref 0.0–5.0)
Eosinophils Absolute: 1.02 10*3/uL — ABNORMAL HIGH (ref 0.00–0.40)
Hematocrit: 28.6 % — ABNORMAL LOW (ref 36.0–48.0)
Hemoglobin: 8.8 g/dL — ABNORMAL LOW (ref 13.0–16.0)
Immature Granulocytes %: 0 % (ref 0.0–0.5)
Immature Granulocytes Absolute: 0 10*3/uL (ref 0.00–0.04)
Lymphocytes %: 6 % — ABNORMAL LOW (ref 21.0–52.0)
Lymphocytes Absolute: 0.87 10*3/uL — ABNORMAL LOW (ref 0.90–3.60)
MCH: 26.5 pg (ref 24.0–34.0)
MCHC: 30.8 g/dL — ABNORMAL LOW (ref 31.0–37.0)
MCV: 86.1 fL (ref 78.0–100.0)
MPV: 8.9 fL — ABNORMAL LOW (ref 9.2–11.8)
Monocytes %: 12 % — ABNORMAL HIGH (ref 3.0–10.0)
Monocytes Absolute: 1.74 10*3/uL — ABNORMAL HIGH (ref 0.05–1.20)
Neutrophils %: 75 % — ABNORMAL HIGH (ref 40.0–73.0)
Neutrophils Absolute: 10.87 10*3/uL — ABNORMAL HIGH (ref 1.80–8.00)
Nucleated RBCs: 0 /100{WBCs}
Platelet Comment: INCREASED
Platelets: 574 10*3/uL — ABNORMAL HIGH (ref 135–420)
RBC: 3.32 M/uL — ABNORMAL LOW (ref 4.35–5.65)
RDW: 15.7 % — ABNORMAL HIGH (ref 11.6–14.5)
WBC: 14.5 10*3/uL — ABNORMAL HIGH (ref 4.6–13.2)
nRBC: 0 10*3/uL (ref 0.00–0.01)

## 2023-05-14 MED ORDER — SODIUM CHLORIDE 0.9 % IV BOLUS
0.9 | Freq: Once | INTRAVENOUS | Status: AC
Start: 2023-05-14 — End: 2023-05-14
  Administered 2023-05-14: 23:00:00 1000 mL via INTRAVENOUS

## 2023-05-14 MED FILL — SODIUM CHLORIDE 0.9 % IV SOLN: 0.9 % | INTRAVENOUS | Qty: 1000

## 2023-05-14 NOTE — ED Triage Notes (Addendum)
Pt came ambulatory to triage c/o bilateral hand pain and bilateral foot pain. Pt states, "I think that my cancer is back. 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 They said their going to do surgery but I can't wait." Noted dry and peeling skin on palms.    Pt is also requesting for flu and pneumonia shot.

## 2023-05-14 NOTE — ED Provider Notes (Signed)
EMERGENCY DEPARTMENT HISTORY AND PHYSICAL EXAM      Date: 05/14/2023  Patient Name: Dean Walker    History of Presenting Illness     Chief Complaint   Patient presents with    Hand Pain    Foot Pain       History (Context): Dean Walker is a 53 y.o. male with significant PMHx for migraine, hypercholesterolemia, PVD presents ambulatory to the ED today. Patient reports dry hands and feet with skin peeling and weight loss over the past few months. Patient reports previous history of non-Hodgkin's lymphoma which was treated in [place]. Patient reports this was a symptom of his non-Hodgkin's previously so he is concerned it may have returned.  Denies CP, SOB, dizziness, lower leg weakness.  Patient has not taken anything for symptoms.  Patient has upcoming appointment with oncologist on 2/20.       PCP: Marvia Pickles, MD    No current facility-administered medications for this encounter.     Current Outpatient Medications   Medication Sig Dispense Refill    ibuprofen (ADVIL;MOTRIN) 800 MG tablet TAKE 1 TABLET BY MOUTH EVERY 8 HOURS AS NEEDED FOR PAIN 90 tablet 3    tiZANidine (ZANAFLEX) 4 MG tablet TAKE 1 TABLET BY MOUTH EVERY 6 HOURS AS NEEDED FOR PAIN 120 tablet 3    omeprazole (PRILOSEC) 40 MG delayed release capsule TAKE 1 CAPSULE BY MOUTH DAILY 90 capsule 2    rosuvastatin (CRESTOR) 5 MG tablet TAKE 1 TABLET BY MOUTH DAILY 90 tablet 2    clopidogrel (PLAVIX) 75 MG tablet TAKE 1 TABLET BY MOUTH DAILY 30 tablet 5    lidocaine (LIDODERM) 5 % Apply patch to the affected area for 12 hours a day and remove for 12 hours a day. (Patient not taking: Reported on 12/23/2021)         Past History     Past Medical History:   Past Medical History:   Diagnosis Date    Hx of blood clots     Hypercholesterolemia     Migraine headache     PVD (peripheral vascular disease) (HCC) 12/30/2020    bilateral groin stent       Past Surgical History:  No past surgical history on file.    Family History:  Family History    Problem Relation Age of Onset    Hypertension Father     Diabetes Mother        Social History:   Social History     Tobacco Use    Smoking status: Former     Current packs/day: 0.00     Average packs/day: 0.5 packs/day for 15.0 years (7.5 ttl pk-yrs)     Types: Cigarettes     Start date: 07/05/2002     Quit date: 07/04/2017     Years since quitting: 5.8     Passive exposure: Never    Smokeless tobacco: Never   Vaping Use    Vaping status: Never Used   Substance Use Topics    Alcohol use: Yes     Alcohol/week: 2.0 standard drinks of alcohol     Types: 2 Cans of beer per week    Drug use: Yes     Frequency: 2.0 times per week     Types: Marijuana Sheran Fava)       Allergies:  Allergies   Allergen Reactions    Tramadol Nausea Only     SEVERE NAUSEA  SEVERE NAUSEA    Gabapentin  Other (See Comments)     Caused foot pain         Physical Exam     Vitals:    05/14/23 1403 05/14/23 1720   BP: 105/71    Pulse: (!) 112 (!) 105   Resp: 16    Temp: 98.7 F (37.1 C)    TempSrc: Oral    SpO2: 100%    Weight: 61.2 kg (135 lb)    Height: 1.676 m (5\' 6" )        Physical Exam  Vitals and nursing note reviewed.   Constitutional:       Appearance: Normal appearance.      Comments: Pt in NAD   HENT:      Head: Normocephalic and atraumatic.   Eyes:      Conjunctiva/sclera: Conjunctivae normal.   Cardiovascular:      Rate and Rhythm: Normal rate and regular rhythm.   Pulmonary:      Effort: Pulmonary effort is normal.      Breath sounds: Normal breath sounds.   Abdominal:      General: Bowel sounds are normal.      Palpations: Abdomen is soft.   Musculoskeletal:      Cervical back: Full passive range of motion without pain.   Skin:     General: Skin is warm and moist.      Comments: Superficial skin peeling to hands. No swelling, erythema, warmth, or drainage.    Neurological:      Mental Status: He is alert.   Psychiatric:         Thought Content: Thought content normal.         Diagnostic Study Results     Labs -     Recent Results (from the  past 12 hour(s))   CBC with Auto Differential    Collection Time: 05/14/23  2:36 PM   Result Value Ref Range    WBC 14.5 (H) 4.6 - 13.2 K/uL    RBC 3.32 (L) 4.35 - 5.65 M/uL    Hemoglobin 8.8 (L) 13.0 - 16.0 g/dL    Hematocrit 40.9 (L) 36.0 - 48.0 %    MCV 86.1 78.0 - 100.0 FL    MCH 26.5 24.0 - 34.0 PG    MCHC 30.8 (L) 31.0 - 37.0 g/dL    RDW 81.1 (H) 91.4 - 14.5 %    Platelets 574 (H) 135 - 420 K/uL    MPV 8.9 (L) 9.2 - 11.8 FL    Nucleated RBCs 0.0 0 PER 100 WBC    nRBC 0.00 0.00 - 0.01 K/uL    Neutrophils % 75.0 (H) 40.0 - 73.0 %    Lymphocytes % 6.0 (L) 21.0 - 52.0 %    Monocytes % 12.0 (H) 3.0 - 10.0 %    Eosinophils % 7.0 (H) 0.0 - 5.0 %    Basophils % 0.0 0.0 - 2.0 %    Immature Granulocytes % 0.0 0.0 - 0.5 %    Neutrophils Absolute 10.87 (H) 1.80 - 8.00 K/UL    Lymphocytes Absolute 0.87 (L) 0.90 - 3.60 K/UL    Monocytes Absolute 1.74 (H) 0.05 - 1.20 K/UL    Eosinophils Absolute 1.02 (H) 0.00 - 0.40 K/UL    Basophils Absolute 0.00 0.00 - 0.10 K/UL    Immature Granulocytes Absolute 0.00 0.00 - 0.04 K/UL    Differential Type MANUAL      Platelet Comment Increased Platelets      RBC Comment ANISOCYTOSIS  1+       Comprehensive Metabolic Panel    Collection Time: 05/14/23  2:36 PM   Result Value Ref Range    Sodium 133 (L) 136 - 145 mmol/L    Potassium 3.7 3.5 - 5.5 mmol/L    Chloride 104 100 - 111 mmol/L    CO2 21 21 - 32 mmol/L    Anion Gap 8 3.0 - 18 mmol/L    Glucose 104 (H) 74 - 99 mg/dL    BUN 9 7.0 - 18 MG/DL    Creatinine 1.61 0.6 - 1.3 MG/DL    BUN/Creatinine Ratio 8 (L) 12 - 20      Est, Glom Filt Rate 82 >60 ml/min/1.2m2    Calcium 9.3 8.5 - 10.1 MG/DL    Total Bilirubin 0.2 0.2 - 1.0 MG/DL    ALT 28 16 - 61 U/L    AST 14 10 - 38 U/L    Alk Phosphatase 167 (H) 45 - 117 U/L    Total Protein 7.5 6.4 - 8.2 g/dL    Albumin 2.1 (L) 3.4 - 5.0 g/dL    Globulin 5.4 (H) 2.0 - 4.0 g/dL    Albumin/Globulin Ratio 0.4 (L) 0.8 - 1.7        Labs Reviewed   CBC WITH AUTO DIFFERENTIAL - Abnormal; Notable for the  following components:       Result Value    WBC 14.5 (*)     RBC 3.32 (*)     Hemoglobin 8.8 (*)     Hematocrit 28.6 (*)     MCHC 30.8 (*)     RDW 15.7 (*)     Platelets 574 (*)     MPV 8.9 (*)     Neutrophils % 75.0 (*)     Lymphocytes % 6.0 (*)     Monocytes % 12.0 (*)     Eosinophils % 7.0 (*)     Neutrophils Absolute 10.87 (*)     Lymphocytes Absolute 0.87 (*)     Monocytes Absolute 1.74 (*)     Eosinophils Absolute 1.02 (*)     All other components within normal limits   COMPREHENSIVE METABOLIC PANEL - Abnormal; Notable for the following components:    Sodium 133 (*)     Glucose 104 (*)     BUN/Creatinine Ratio 8 (*)     Alk Phosphatase 167 (*)     Albumin 2.1 (*)     Globulin 5.4 (*)     Albumin/Globulin Ratio 0.4 (*)     All other components within normal limits       Radiologic Studies -   No orders to display         The laboratory results, imaging results, and other diagnostic exams were reviewed in the EMR.    Medical Decision Making   I am the first provider for this patient.    I reviewed the vital signs, available nursing notes, past medical history, past surgical history, family history and social history.    Vital Signs-Reviewed the patient's vital signs.         Records Reviewed: Personally, on initial evaluation    MDM:   DDX includes but is not limited to: Non-Hodgkin's lymphoma, Dehydration, Electrolyte abnormality       53 y.o. male who presents to the ED with skin peeling and dry skin to hands and feet bilaterally which he states was indicative of non-Hodgkin's lymphoma previously.  Patient also reports he has been  losing weight.  Patient initially tachycardic.  On exam scaling dry hands with no signs of secondary infection.          Orders as below:  Orders Placed This Encounter   Procedures    CBC with Auto Differential    Comprehensive Metabolic Panel    Insert peripheral IV          ED Course:   ED Course as of 05/14/23 1903   Sun May 14, 2023   1432 Initial evaluation performed.  Labs  ordered.  Will continue to reevaluate [ET]   1715 Reviewed lab results. CBC shows leukocytosis without left shift. Low RBCs. Hemoglobin of 8.8.  Elevated platelets.  Low lymphocytes.  Glucose mildly elevated.  Normal creatinine.  Mildly low sodium. [ET]   1718 Discussed lab results with patient and discussed cannot confirm lymphoma with these lab results. Patient states he has upcoming appt with oncologist on 2/20.  Stressed importance of continued management given strict return precautions [ET]   1826 Patient reassessed at bedside after fluids.  Again discussed results and patient requesting discharge.  Discussed need for continued outpatient oncology follow-up. Will discharge patient home with strict return precautions and follow up recommendations. Patient verbalized understanding and is without further questions. At time of discharge, pt non-toxic appearing in NAD. Pt stable for prompt outpatient follow-up with PCP 1 to 2 days.     [ET]      ED Course User Index  [ET] Joaquim Lai, PA           Procedures:  Procedures        Social Determinants of Health: none       Supplemental Historians include:       Documentation/Prior Results Review:  Old medical records.  Nursing notes.      Discussion of Mangement with other Physicians, QHP or Appropriate Source:          Diagnosis and Disposition     CLINICAL IMPRESSION:  1. Abnormal CBC         Medication List        ASK your doctor about these medications      clopidogrel 75 MG tablet  Commonly known as: PLAVIX  TAKE 1 TABLET BY MOUTH DAILY     ibuprofen 800 MG tablet  Commonly known as: ADVIL;MOTRIN  TAKE 1 TABLET BY MOUTH EVERY 8 HOURS AS NEEDED FOR PAIN     lidocaine 5 %  Commonly known as: LIDODERM     omeprazole 40 MG delayed release capsule  Commonly known as: PRILOSEC  TAKE 1 CAPSULE BY MOUTH DAILY     rosuvastatin 5 MG tablet  Commonly known as: CRESTOR  TAKE 1 TABLET BY MOUTH DAILY     tiZANidine 4 MG tablet  Commonly known as: ZANAFLEX  TAKE 1 TABLET  BY MOUTH EVERY 6 HOURS AS NEEDED FOR PAIN              Disposition: Discharged    Patient condition at time of disposition: Stable    DISCHARGE NOTE:   Pt has been reexamined. Patient has no new complaints, changes, or physical findings.  Care plan outlined and precautions discussed.  Results were reviewed with the patient. All medications were reviewed with the patient. All of pt's questions and concerns were addressed.  Alarm symptoms and return precautions associated with chief complaint and evaluation were reviewed with the patient in detail.  The patient demonstrated adequate understanding.  Patient was instructed to follow  up with PCP, as well as given strict return precautions to the ED upon further deterioration. Patient is ready for discharge home.        Dragon Disclaimer     Please note that this dictation was completed with Dragon, the [employer] account planner.  Quite often unanticipated grammatical, syntax, homophones, and other interpretive errors are inadvertently transcribed by the computer software.  Please disregard these errors.  Please excuse any errors that have escaped final proofreading.      [address] PA-C       Joaquim Lai, [place]  05/14/23 1905

## 2023-05-30 ENCOUNTER — Encounter: Payer: PRIVATE HEALTH INSURANCE | Attending: Internal Medicine | Primary: Internal Medicine

## 2023-06-21 ENCOUNTER — Encounter: Payer: MEDICARE | Attending: Internal Medicine | Primary: Internal Medicine

## 2023-06-27 ENCOUNTER — Encounter: Payer: MEDICARE | Attending: Internal Medicine | Primary: Internal Medicine
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
## 2023-07-10 ENCOUNTER — Ambulatory Visit: Admit: 2023-07-10 | Discharge: 2023-07-10 | Payer: MEDICARE | Attending: Internal Medicine | Primary: Internal Medicine

## 2023-07-10 DIAGNOSIS — C866 Primary cutaneous CD30-positive T-cell proliferations not having achieved remission: Secondary | ICD-10-CM

## 2023-07-10 NOTE — Progress Notes (Signed)
 Dean Walker presents today for   Chief Complaint   Patient presents with    Follow-up     [facility]     Referral - General     Podiatry to cut toe nails             "Have you been to the ER, urgent care clinic since your last visit?  Hospitalized since your last visit?"    Yes, [facility]     "Have you seen or consulted any other health care providers outside of [facility] since your last visit?"    Yes, Dr. Wray Heady, and Dr. Filibirti.

## 2023-07-10 NOTE — Progress Notes (Signed)
 Post-Discharge Transitional Care Management Progress Note      Dean Walker   Date of Birth:  [birth year]    Date of Office Visit:  07/10/2023  Date of Hospital Admission: 05/14/23  Date of Hospital Discharge: 06/29/23    Care management risk score Rising risk (score 2-5) and Complex Care (Scores >=6): No Risk Score On File     Non face to face  following discharge, date last encounter closed (first attempt may have been earlier): *No documented post hospital discharge outreach found in the last 14 days *No documented post hospital discharge outreach found in the last 14 days    Call initiated 2 business days of discharge: *No response recorded in the last 14 days    ASSESSMENT/PLAN:     ICD-10-CM    1. 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 He continues treatment and f/u with oncology       2. Mixed hyperlipidemia  E78.2 Controlled on Crestor 5 mg       3. Peripheral vascular disease, unspecified  I73.9 Currently stable. Pt to f/u with Vascular surgery       4. Fungal toenail infection  B35.1 Amb External Referral To Podiatry      5. Hospital discharge follow-up  Z09 PR DISCHARGE MEDS RECONCILED W/ CURRENT OUTPATIENT MED LIST           Medical Decision Making: moderate complexity  Return in about 3 months (around 10/09/2023) for OV, Medicare Wellness, labs 1 week before.         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 and/or related to lymphoma   Acute nonhemolytic transfusion reaction   Acute bacterial conjunctivitis involving B  eyes, improved   Cutaneous candidiasis in bilateral groin, scrotum and perineum, natal cleft   Oral candidiasis, improved   Dryness of the nose   Leukocytosis   Anemia suspect chemotherapy induced   Reactive thrombocytosis   Metabolic acidosis secondary to diarrhea  Acute kidney injury, resolved  Suspect prerenal etiology due to hypovolemia, GI losses   Chronic low back pain, controlled   Peripheral arterial disease   Dyslipidemia   GERD   Severe Protein Calorie Malnutrition  Etiology: Chronic illness   Vitamin D deficiency   Vitamin A deficiency  Acute Hyponatremia   Hypophosphatemia: acute   Acute hypomagnesemia   Acute hypokalemia   History of left nephrectomy    Inpatient course: Discharge summary reviewed- see chart.    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 Treated in [place] . On 03/01/23, biopsy of buttocks showed recurrence of primary T-cell lymphoma CD30+.  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  -Skin biopsy from right buttock lesion positive for CD-30 lymphoproliferative disorder/ T cell lymphoma - recurrence  - Mediport in place  -S/p excisional LN biopsy on 05/30/2023  Received single dose brentuximab 05/27/2023 and again on 06/27/2023  CT chest, abdomen and pelvis with contrast on 06/28/2023 showed interval right inguinal lymphadenectomy with small  lymphocele/seroma, decreased size/number of right inguinal lymph nodes, no gross change in extent of left pelvic sidewall and inguinal lymphadenopathy and lymph nodes are slightly smaller and no chest adenopathy  Oncology will follow-up as outpatient     Sepsis  with Proteus bacteremia  CXR: Mild bilateral infrahilar opacity likely representing infection/inflammation or atelectasis  Blood culture 06/06/2023 growing Proteus  Surveillance blood culture 06/09/2023 and  06/12/2023 without growth  Respiratory pathogen panel unrevealing  - discontinued: cefazolin 1000 mg iv (05/30/23 - 05/30/23), cefepime 2 g iv (06/07/23 - 06/08/23), ceftriaxone 2 g iv (06/12/23 - 06/12/23), linezolid 600 mg po (06/09/23 - 06/12/23), phenylephrine iv, piperacillin-tazobactam 4500 mg (06/08/23 - 06/12/23), epinephrine 0.005 mg/mL, vancomycin (06/06/23 - 06/06/23)  Infectious disease following. Reviewed recommendations to continue IV antibiotic therapy thru 06/20/2023  -Treated with: ampicillin-sulbactam 3 g iv q6h (06/13/23 -06/16/2023)  -Treated with Bactrim DS 1 tablet p.o. twice daily,06/16/2023-06/21/2023     Cellulitis due to MRSA involving L UE, improved  - MRSA screen positive  Off linezolid     Chronic Diarrhea suspect due to exocrine pancreatic insufficiency and/or related to lymphoma  - Pancreatic elastase <10  - CT abdomen/pelvis 2/18: Fluid-filled loops of small and large bowel. Nonspecific, may be seen in enterocolitis/diarrheal illness  GI Profile negative  C.dif negative  alpha1 antitrypsin negative  Giardia negative, microsporidia, strongyloides negative  Negative Syphilis IgM/IgG 03/1  Enteric culture from 02/25 negative for Salmonella, Shigella, uricemia, vibrio, Aeromonas, Plesiomonas  Stool for ova and parasite negative on 05/23/2023 and on 05/31/2023  CMV DNA PCR not detected  Calprotectin normal  S/p EGD 2/20/20245" Normal esophagus. Gastritis. Biopsied. Normal duodenal bulb, first portion of the duodenum and second portion of the duodenum. Biopsied."  S/p colonoscopy " One 4 mm polyp in the sigmoid colon, removed with a cold biopsy forceps. Resected and retrieved.- Normal mucosa in the cecum. Biopsied. Non-bleeding internal hemorrhoids. Mild chronic duodenitis. Erosive/reactive gastropathy. Colon, right, biopsy:- Focal active colitis - No evidence of microscopic colitis- No granuloma identified- Negative for dysplasia. Colon, left, biopsy:- Benign colonic mucosa with mild focal acute  inflammation- No evidence of microscopic colitis- No granuloma identified- Negative for dysplasia  Colon, rectum, biopsy:- Benign colonic mucosa. Colon, sigmoid, polyp, polypectomy:- Tubular adenoma"  Received octreotide without relief of diarrhea  Continued Creon 6 capsules orally 3 times daily with meals  Treated with cholestyramine 1 packet every day and discontinued on 06/18/2023  Continue Lomotil 2 tablets p.o. 4 times daily  Metronidazole 250 mg p.o. every 8 hours started on 06/15/2023 and completed on 06/21/2023  Received Entocort 9 mg p.o. daily, 06/21/2023 - 06/22/2023  CRP peaked at 16 on 06/02/2023 and down to 8.4 on 06/18/2023  beta D- glucan <31 and LDH 217  Pancreatic elastase 1 repeated and was pending at the time of discharge  Fecal sodium 31 and potassium 70 on 06/19/2023  GI evaluated and appreciate assistance     Acute nonhemolytic transfusion reaction  Developed fever and chills after 1 unit pRBC  Screen for DIC unrevealing  Direct antiglobulin test negative with no evidence of hemolytic transfusion reaction  Blood culture without growth  Premedicate with acetaminophen and Benadryl prior to transfusion in the future     Acute bacterial conjunctivitis involving B eyes, improving  CT of the orbits showed "No appreciable mass lesion at the area of the orbits; no suggestion of deep soft tissue involvement if there is cutaneous lesion. "  Ophthalmology recommended Polysporin ointment twice daily and symptoms improved  Cutaneous candidiasis in bilateral groin, scrotum and perineum, natal cleft  Per wound care " Treat skin affected with cutaneous candidiasis with Nystatin/Balmex mixture BID and PRN for fecal/urinary incontinence.  Moisturize bilateral feet with Eucerin cream BID.  Continue to moisturize bilateral hands with Aquaphor ointment and white petrolatum ointment daily."     Oral candidiasis, improving  -Treated with oral nystatin swish and spit and held on 06/18/2023     Dryness of the nose  -Treated  with Ocean spray and topical Vaseline     Leukocytosis  Suspect due to underlying lymphoma  UA (05/23/2023) without evidence of infection  CXR (05/24/2023) Bibasilar patchy opacities could represent atelectasis or developing infiltrate.  Procalcitonin 0.2.  Blood cultures negative  Monitored closely     Anemia suspect chemotherapy induced  Received pRBC transfusions  Per heme/onc, " Etiology of anemia is suppression of erythropoiesis by current systemic inflammatory process. Whether that is solely from his T cell NHL (or whether there's a concurrent systemic inflammatory process that is driving his diarrhea) remains unclear, but will follow up on pending stool studies to clarify."  - total iron: 19, ferritin: 248 (05/26/23)  -B12 681, folate 4.9 (05/16/23)  -LDH 650 (05/16/23)  - reticulocyte index: 0.5 %, mean corpuscular volume: 82 (06/14/23)  - treated with ferrous sulfate 60 mg/mL 300 mg po q2d, held on 06/18/2023     Reactive thrombocytosis  - Platelets:high 782 1000/uL (05/27/23)     Metabolic acidosis  - Suspect due to GI losses  - calculated anion-gap: 15 (06/14/23)  -Held: Sodium bicarbonate 325 mg po QID on 06/18/2023 and discontinued on discharge     Acute kidney injury, resolved  Suspect prerenal etiology due to hypovolemia, GI losses  - creatinine: 0.9 (06/29/23) up from 0.8 (06/28/23), high 1.4 (05/27/23)  -Treated with: Lactated Ringers infusion 50 mL/hr iv continuous in the hospital     Chronic low back pain, controlled  Treated with as needed morphine and tizanidine and discharged on Percocet as needed "    Pt's chronic diarrhea is controlled on Creon and Lomotil. He will try to stay off the Lomotil which he has been off for about 5 days. He continues to get treatments for his T cell Lymphoma with oncology (Dr Wray Heady).  He continues on Crestor 5 mg for his high cholesterol and h/o PVD.     Patient Active Problem List   Diagnosis    Migraine headache    Iliac artery occlusion, bilateral (HCC)    Primary cutaneous  CD30 antigen positive large T-cell lymphoma       Medications listed as ordered at the time of discharge from hospital     Medication List            Accurate as of July 10, 2023 11:59 PM. If you have any questions, ask your nurse or doctor.                CONTINUE taking these medications      diphenoxylate-atropine 2.5-0.025 MG per tablet  Commonly known as: LOMOTIL     lipase-protease-amylase 24000-76000 units delayed release capsule  Commonly known as: CREON     omeprazole 40 MG delayed release capsule  Commonly known as: PRILOSEC  TAKE 1 CAPSULE BY MOUTH DAILY     rosuvastatin 5 MG tablet  Commonly known as: CRESTOR  TAKE 1 TABLET BY MOUTH DAILY            STOP taking these medications  clopidogrel 75 MG tablet  Commonly known as: PLAVIX  Stopped by: Dr. Kelvin Pattee, MD     ibuprofen 800 MG tablet  Commonly known as: ADVIL;MOTRIN  Stopped by: Dr. Kelvin Pattee, MD     lidocaine 5 %  Commonly known as: LIDODERM  Stopped by: Dr. Kelvin Pattee, MD     tiZANidine 4 MG tablet  Commonly known as: ZANAFLEX  Stopped by: Dr. Kelvin Pattee, MD                Medications marked "taking" at this time  Outpatient Medications Marked as Taking for the 07/10/23 encounter (Office Visit) with Lorra Rosella, MD   Medication Sig Dispense Refill    lipase-protease-amylase (CREON) 24000-76000 units delayed release capsule Take 6 capsules by mouth 3 times daily (with meals)      diphenoxylate-atropine (LOMOTIL) 2.5-0.025 MG per tablet Take 2 tablets by mouth 4 times daily.      omeprazole (PRILOSEC) 40 MG delayed release capsule TAKE 1 CAPSULE BY MOUTH DAILY 90 capsule 2    rosuvastatin (CRESTOR) 5 MG tablet TAKE 1 TABLET BY MOUTH DAILY 90 tablet 2        Medications patient taking as of now reconciled against medications ordered at time of hospital discharge: Yes    A comprehensive review of systems was negative except for what was noted in the HPI.    Objective:    BP 105/72 (BP Site: Right Upper Arm, Patient Position: Sitting, BP Cuff  Size: Small Adult)   Pulse (!) 108   Temp 97.2 F (36.2 C) (Temporal)   Resp 16   Ht 1.676 m (5\' 6" )   Wt 62.1 kg (137 lb)   BMI 22.11 kg/m   Skin: pt has gloves on both hands  Pulmonary/Chest: clear to auscultation bilaterally- no wheezes, rales or rhonchi, normal air movement, no respiratory distress  Cardiovascular: normal rate, normal S1 and S2, no gallops, intact distal pulses, and no carotid bruits  Abdomen: soft, non-tender, non-distended, normal bowel sounds, no masses or organomegaly  Extremities: no cyanosis, no clubbing, and no edema    An electronic signature was used to authenticate this note.  --Tanith Dagostino Farris Hong, MD

## 2023-07-14 MED ORDER — EUCERIN ORIGINAL HEALING EX CREA
CUTANEOUS | 1 refills | 7.00000 days | Status: DC
Start: 2023-07-14 — End: 2024-04-22

## 2023-07-14 MED ORDER — ROSUVASTATIN CALCIUM 5 MG PO TABS
5 | ORAL_TABLET | Freq: Every day | ORAL | 2 refills | 90.00000 days | Status: DC
Start: 2023-07-14 — End: 2024-04-24

## 2023-07-14 NOTE — Telephone Encounter (Signed)
 Patient is a requesting a refill on Eucerin Cream to be sent to the pharmacy.  Patient states he got this while he was in the hospital.    Please advise.

## 2023-08-23 ENCOUNTER — Encounter

## 2023-09-04 ENCOUNTER — Inpatient Hospital Stay: Admit: 2023-09-04 | Payer: Medicare (Managed Care) | Primary: Internal Medicine

## 2023-09-04 DIAGNOSIS — C8469 Anaplastic large cell lymphoma, ALK-positive, extranodal and solid organ sites: Secondary | ICD-10-CM

## 2023-09-04 MED ORDER — BARIUM SULFATE 2 % PO SUSP
2 % | Freq: Once | ORAL | Status: DC | PRN
Start: 2023-09-04 — End: 2023-09-08
  Administered 2023-09-04: 13:00:00 900 mL via ORAL

## 2023-09-04 MED ORDER — IOPAMIDOL 61 % IV SOLN
61 | Freq: Once | INTRAVENOUS | Status: AC | PRN
Start: 2023-09-04 — End: 2023-09-04
  Administered 2023-09-04: 15:00:00 100 mL via INTRAVENOUS

## 2023-09-04 MED FILL — ISOVUE-300 61 % IV SOLN: 61 % | INTRAVENOUS | Qty: 100 | Fill #0

## 2023-09-04 MED FILL — READI-CAT 2 2 % PO SUSP: 2 % | ORAL | Qty: 450 | Fill #0

## 2023-10-04 MED ORDER — OMEPRAZOLE 40 MG PO CPDR
40 | ORAL_CAPSULE | Freq: Every day | ORAL | 2 refills | 90.00000 days | Status: DC
Start: 2023-10-04 — End: 2024-02-15

## 2023-10-09 ENCOUNTER — Encounter: Payer: Medicare (Managed Care) | Attending: Internal Medicine | Primary: Internal Medicine

## 2023-10-16 ENCOUNTER — Encounter: Payer: Medicare (Managed Care) | Attending: Internal Medicine | Primary: Internal Medicine

## 2023-11-07 ENCOUNTER — Encounter: Payer: Medicare (Managed Care) | Attending: Internal Medicine | Primary: Internal Medicine

## 2023-12-11 NOTE — Telephone Encounter (Signed)
 Patient contacted the office asking for a refill of Hydroxyzine 10mg , unable to locate in chart.     [facility] [address]

## 2023-12-11 NOTE — Telephone Encounter (Signed)
 Last OV: 07/10/2023  Next OV: 01/01/2024  Last refill: not on current med list

## 2023-12-11 NOTE — Telephone Encounter (Signed)
 What does he need the medication for?

## 2023-12-11 NOTE — Telephone Encounter (Signed)
 Noted. Medication he called in was no where in his chart so tried to get clarification from him so we could order it for him or advise him which specialist to ask for it.

## 2023-12-11 NOTE — Telephone Encounter (Signed)
 Patient states he was prescribed this for a stent.  Patient was very upset because he could not tell me the reason it was prescribed.  Patient just kept saying over and over you need to call and ask why I was prescribed this medication .  Patient was informed it is not my responsibility to know what medications he is on and for what reason.  Patient stated this nurse needs to go some where patient was advised do not talk to me like that and hung up the phone.

## 2024-01-01 ENCOUNTER — Encounter: Payer: Medicare (Managed Care) | Attending: Internal Medicine | Primary: Internal Medicine

## 2024-01-16 ENCOUNTER — Encounter: Payer: Medicare (Managed Care) | Attending: Internal Medicine | Primary: Internal Medicine

## 2024-02-14 ENCOUNTER — Encounter

## 2024-02-15 MED ORDER — OMEPRAZOLE 40 MG PO CPDR
40 | ORAL_CAPSULE | Freq: Every day | ORAL | 2 refills | 90.00000 days | Status: DC
Start: 2024-02-15 — End: 2024-04-22

## 2024-02-19 ENCOUNTER — Inpatient Hospital Stay: Admit: 2024-02-19 | Payer: Medicare (Managed Care) | Primary: Internal Medicine

## 2024-02-19 DIAGNOSIS — C8475 Anaplastic large cell lymphoma, ALK-negative, lymph nodes of inguinal region and lower limb: Principal | ICD-10-CM

## 2024-02-19 MED ORDER — BARIUM SULFATE 2 % PO SUSP
2 | Freq: Once | ORAL | Status: AC | PRN
Start: 2024-02-19 — End: 2024-02-19
  Administered 2024-02-19: 17:00:00 900 mL via ORAL

## 2024-02-19 MED ORDER — IOPAMIDOL 61 % IV SOLN
61 | Freq: Once | INTRAVENOUS | Status: AC | PRN
Start: 2024-02-19 — End: 2024-02-19
  Administered 2024-02-19: 20:00:00 80 mL via INTRAVENOUS

## 2024-02-19 MED FILL — ISOVUE-300 61 % IV SOLN: 61 % | INTRAVENOUS | Qty: 80 | Fill #0

## 2024-02-19 MED FILL — READI-CAT 2 2 % PO SUSP: 2 % | ORAL | Qty: 900 | Fill #0

## 2024-04-01 ENCOUNTER — Encounter: Payer: Medicare (Managed Care) | Attending: Internal Medicine | Primary: Internal Medicine

## 2024-04-04 ENCOUNTER — Ambulatory Visit: Payer: Medicare (Managed Care) | Attending: Internal Medicine | Primary: Internal Medicine

## 2024-04-22 ENCOUNTER — Ambulatory Visit
Admit: 2024-04-22 | Discharge: 2024-04-22 | Payer: Medicare (Managed Care) | Attending: Internal Medicine | Primary: Internal Medicine

## 2024-04-22 VITALS — BP 124/86 | HR 87 | Temp 98.10000°F | Resp 20 | Ht 66.0 in | Wt 156.0 lb

## 2024-04-22 DIAGNOSIS — Z0000 Encounter for general adult medical examination without abnormal findings: Principal | ICD-10-CM

## 2024-04-22 MED ORDER — OMEPRAZOLE 40 MG PO CPDR
40 | ORAL_CAPSULE | Freq: Every day | ORAL | 2 refills | Status: AC
Start: 2024-04-22 — End: ?

## 2024-04-22 MED ORDER — TIZANIDINE HCL 4 MG PO TABS
4 | ORAL_TABLET | Freq: Three times a day (TID) | ORAL | 5 refills | Status: AC | PRN
Start: 2024-04-22 — End: ?

## 2024-04-22 NOTE — Progress Notes (Signed)
 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     Subjective       Patient's complete Health Risk Assessment and screening values have been reviewed and are found in Flowsheets. The following problems were reviewed today and where indicated follow up appointments were made and/or referrals ordered.    Positive Risk Factor Screenings with Interventions:        Drug Use:   Substance and Sexual Activity   Drug Use Yes    Frequency: 2.0 times per week    Types: Marijuana (Weed)     Interventions:  Patient declined any further intervention or treatment         Inactivity:  On average, how many days per week do you engage in moderate to strenuous exercise (like a brisk walk)?: 3 days  On average, how many minutes do you engage in exercise at this level?: 30 min  These responses indicate insufficient exercise. Goal is > 149 minutes/week.    Interventions:  Patient declined any further interventions or treatment     Dentist Screen:  Have you seen the dentist within the past year?: (!) No  Intervention:  Patient declines any further evaluation or treatment    Vision Screen:  Do you have difficulty driving, watching TV, or doing any of your daily activities because of your eyesight?: No  Have you had an eye exam within the past year?: (!) No  Interventions:  Patient encouraged to make appointment with their eye specialist     Safety:  Do you have non-slip mats or non-slip surfaces or shower bars or grab bars in your shower or bathtub?: (!)  No  Interventions:  Patient declined any further interventions or treatment      Advanced Directives:  Do you have a Living Will?: (!) No  Intervention:  has NO advanced directive - not interested in additional information                      Objective   Vitals:    04/22/24 1056   BP: 124/86   BP Site: Left Upper Arm   Patient Position: Sitting   BP Cuff Size: Large Adult   Pulse: 87   Resp: 20   Temp: 98.1 F (36.7 C)   TempSrc: Temporal   SpO2: 98%   Weight: 70.8 kg (156 lb)   Height: 1.676 m (5' 6)      Body mass index is 25.18 kg/m.                  Allergies   Allergen Reactions    Tramadol Nausea Only     SEVERE NAUSEA  SEVERE NAUSEA    Gabapentin Other (See Comments)     Caused foot pain     Prior to Visit Medications   Medication Sig Taking? Authorizing Provider   tiZANidine  (ZANAFLEX ) 4 MG tablet  Take 1 tablet by mouth every 8 hours as needed (back pain) Yes Shawniece Oyola, Marcus CROME, MD   omeprazole  (PRILOSEC) 40 MG delayed release capsule Take 1 capsule by mouth daily Yes Presley Gora, Marcus CROME, MD   rosuvastatin  (CRESTOR ) 5 MG tablet TAKE 1 TABLET BY MOUTH DAILY Yes Zaccheaus Storlie, Marcus CROME, MD   lipase-protease-amylase (CREON) 24000-76000 units delayed release capsule Take 6 capsules by mouth 3 times daily (with meals) Yes [provider]   diphenoxylate-atropine (LOMOTIL) 2.5-0.025 MG per tablet Take 2 tablets by mouth 4 times daily. Yes [provider]   Skin Protectants, Misc. (EUCERIN ORIGINAL HEALING) CREA Apply BID as needed  Oluwatamilore Starnes, Marcus CROME, MD       CareTeam (Including outside providers/suppliers regularly involved in providing care):   Patient Care Team:  Waldon Marcus CROME, MD as PCP - General  Waldon, Marcus CROME, MD as PCP - Empaneled Provider  Damle, Roseanna LABOR, MD (Hematology and Oncology)     Recommendations for Preventive Services Due: see orders and patient instructions/AVS.  Recommended screening schedule for the next 5-10 years is provided to the patient in written form: see Patient Instructions/AVS.      Reviewed and updated this visit:  Tobacco  Allergies  Meds  Problems  Med Hx  Surg Hx  Fam Hx

## 2024-04-22 NOTE — Progress Notes (Signed)
 Dean Walker presents today for   Chief Complaint   Patient presents with    Medicare AWV           Have you been to the ER, urgent care clinic since your last visit?  Hospitalized since your last visit?    NO    Have you seen or consulted any other health care providers outside of [facility] since your last visit?    Yes,  [facility]

## 2024-04-22 NOTE — Progress Notes (Signed)
 Dean Walker is a 54 y.o.  male and presents with high, cholesterol, NHL, PVD      SUBJECTIVE:  Osteoarthritis and Chronic Pain:  Patient has osteoarthritis, primarily affecting the back.   Symptoms onset: problem is longstanding.  Rheumatological ROS: using Motrin  and Zanaflex  on a regular basis for pain control.  Lidoderm patches also help.  Response to treatment plan: pt had ETOH and marijuana in [place] so stopped  Norco and will not prescribe narcotic meds due high risk of abuse.  Patient continues to use marijuana for pain control.    Pt's GERD is well controlled Prilosec     Patient has a history of NHL.  Dean Walker first noticed a cough around April/2019 and had a 20lb weight loss around the same time due to decreased appetite. He presented to the ED on 08/09/17 for worsening SOB and a CTA chest was performed and revealed innumerable pulmonary nodules highly concerning for metastatic disease. 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 Bone marrow biopsy was negative. He completed 6 cycles of Adcetris, Doxorubicin, Cytoxan, and Prednisone without incident in October/2019. Post treatment PET scan in November/2019 revealed complete remission. He was referred to [facility] for consideration of autologous stem cell transplant but since he was in remission plan was to monitor q 3 months for now for 2 years.  Patient received his treatment in [place]  but is now back in [place] and is seeing Dr GORMAN Spray. He actually had recurrence T cell lymphoma and is undergoing chemotherapy with oncology.     Patient was having claudication and was referred to vascular surgery where he had intervention which showed occlusion of left external iliac artery.  He had angioplasty and stent of the left common and external iliac artery x3.  He has not followed up with Vascular.  Due to history of peripheral artery disease he is  on Crestor  5 mg daily for  his dyslipidemia.  Aim LDL <70.  Patient has had labs ordered but has not been compliant with having them done due to chemotherapy treatments.     Respiratory ROS: negative for - shortness of breath  Cardiovascular ROS: negative for - chest pain    Current Outpatient Medications   Medication Sig    tiZANidine  (ZANAFLEX ) 4 MG tablet Take 1 tablet by mouth every 8 hours as needed (back pain)    omeprazole  (PRILOSEC) 40 MG delayed release capsule Take 1 capsule by mouth daily    rosuvastatin  (CRESTOR ) 5 MG tablet TAKE 1 TABLET BY MOUTH DAILY    lipase-protease-amylase (CREON) 24000-76000 units delayed release capsule Take 6 capsules by mouth 3 times daily (with meals)    diphenoxylate-atropine (LOMOTIL) 2.5-0.025 MG per tablet Take 2 tablets by mouth 4 times daily.     No current facility-administered medications for this visit.             OBJECTIVE:  alert, well appearing, and in no distress  Visit Vitals  BP 124/86 (BP Site: Left Upper Arm, Patient Position: Sitting, BP Cuff Size: Large Adult)   Pulse 87   Temp 98.1 F (36.7 C) (Temporal)   Resp 20   Ht 1.676 m (5' 6)   Wt 70.8 kg (156 lb)   SpO2 98%   BMI 25.18 kg/m       Chest - clear to auscultation, no wheezes, rales or rhonchi, symmetric air entry  Heart - normal rate, regular rhythm,  normal S1, S2, no murmurs, rubs, clicks or gallops  Extremities - peripheral pulses normal, no pedal edema, no clubbing or cyanosis    CMP:    Lab Results   Component Value Date/Time    NA 133 05/14/2023 02:36 PM    K 3.7 05/14/2023 02:36 PM    CL 104 05/14/2023 02:36 PM    CO2 21 05/14/2023 02:36 PM    BUN 9 05/14/2023 02:36 PM    CREATININE 1.08 05/14/2023 02:36 PM    GFRAA >60 11/25/2020 09:44 AM    AGRATIO 1.1 09/11/2020 11:20 AM    LABGLOM 82 05/14/2023 02:36 PM    LABGLOM >60 01/05/2022 09:20 AM    GLUCOSE 104 05/14/2023 02:36 PM    CALCIUM  9.3 05/14/2023 02:36 PM    BILITOT 0.2 05/14/2023 02:36 PM    ALKPHOS 167 05/14/2023 02:36 PM    ALKPHOS 97 09/11/2020 11:20 AM     AST 14 05/14/2023 02:36 PM    ALT 28 05/14/2023 02:36 PM     FLP:    Lab Results   Component Value Date/Time    TRIG 78 01/05/2022 09:20 AM    HDL 60 01/05/2022 09:20 AM     PSA:   Lab Results   Component Value Date/Time    PSA 1.5 01/05/2022 09:20 AM    PSA 1.1 03/13/2020 11:50 AM               Assessment/Plan    ICD-10-CM    1. Initial Medicare annual wellness visit  Z00.00       2. Mixed hyperlipidemia  E78.2 Control uncertain on Crestor  5 mg daily.  Patient has not been compliant with following up to have his lipid profile done lipid Panel     Comprehensive Metabolic Panel      3. Peripheral vascular disease, unspecified  I73.9 Clinically stable.  Patient has not followed up with vascular surgery due to his focus and being treated for his lymphoma.      4. 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      5. Vitamin D deficiency  E55.9 Status unknown patient to follow-up to have Vitamin D 25 Hydroxy      6. Prostate cancer screening  Z12.5 PSA Screening           HM-patient is due for 1 year follow-up colonoscopy in March 2026.  May be delayed with him being treated for lymphoma.    Return in about 6 months (around 10/20/2024) for labs 1 week before.       Reviewed plan of care. Patient has provided input and agrees with goals.

## 2024-04-22 NOTE — Patient Instructions (Addendum)
 Substance Use Disorder: Care Instructions  Overview     You can improve your life and health by stopping your use of alcohol or drugs. When you don't drink or use drugs, you may feel and sleep better. You may get along better with your family, friends, and coworkers. There are medicines and programs that can help with substance use disorder.  How can you care for yourself at home?  Here are some ways to help you stay sober and prevent relapse.  If you have been given medicine to help keep you sober or reduce your cravings, be sure to take it exactly as prescribed.  Talk to your doctor about programs that can help you stop using drugs or drinking alcohol.  Do not keep alcohol or drugs in your home.  Plan ahead. Think about what you'll say if other people ask you to drink or use drugs. Try not to spend time with people who drink or use drugs.  Use the time and money spent on drinking or drugs to do something that's important to you.  Preventing a relapse  Have a plan to deal with relapse. Learn to recognize changes in your thinking that lead you to drink or use drugs. Get help before you start to drink or use drugs again.  Try to stay away from situations, friends, or places that may lead you to drink or use drugs.  If you feel the need to drink alcohol or use drugs again, seek help right away. Call a trusted friend or family member. Some people get support from organizations such as Narcotics Anonymous or SMART Recovery or from treatment facilities.  If you relapse, get help as soon as you can. Some people make a plan with another person that outlines what they want that person to do for them if they relapse. The plan usually includes how to handle the relapse and who to notify in case of relapse.  Don't give up. Remember that a relapse doesn't mean that you have failed. Use the experience to learn the triggers that lead you to drink or use drugs. Then quit again. Recovery is a lifelong process. Many people have  several relapses before they are able to quit for good.  Follow-up care is a key part of your treatment and safety. Be sure to make and go to all appointments, and call your doctor if you are having problems. It's also a good idea to know your test results and keep a list of the medicines you take.  When should you call for help?   Call 911  anytime you think you may need emergency care. For example, call if you or someone else:    Has overdosed or has withdrawal signs. Be sure to tell the emergency workers that you are or someone else is using or trying to quit using drugs. Overdose or withdrawal signs may include:  Losing consciousness.  Seizure.  Seeing or hearing things that aren't there (hallucinations).     Is thinking or talking about suicide or harming others.   Where to get help 24 hours a day, 7 days a week   If you or someone you know talks about suicide, self-harm, a mental health crisis, a substance use crisis, or any other kind of emotional distress, get help right away. You can:    Call the Suicide and Crisis Lifeline at 57.     Call 1-800-273-TALK (220 525 5781).     Text HOME to 708-061-5496 to access the Crisis Text  Line.   Consider saving these numbers in your phone.  Go to 988lifeline.org for more information or to chat online.  Call your doctor now or seek immediate medical care if:    You are having withdrawal symptoms. These may include nausea or vomiting, sweating, shakiness, and anxiety.   Watch closely for changes in your health, and be sure to contact your doctor if:    You have a relapse.     You need more help or support to stop.   Where can you learn more?  Go to [employer] and enter H573 to learn more about Substance Use Disorder: Care Instructions.  Current as of: November 15, 2022  Content Version: 14.6   2024-2025 [place], [place].   Care instructions adapted under license by [facility]. If you have questions about a medical condition or this  instruction, always ask your healthcare professional. Romayne Alderman, [facility], disclaims any warranty or liability for your use of this information.         Learning About Vision Tests  What are vision tests?     The four most common vision tests are visual acuity tests, refraction, visual field tests, and color vision tests.  Visual acuity (sharpness) tests  These tests are used:  To see if you need glasses or contact lenses.  To monitor an eye problem.  To check an eye injury.  Visual acuity tests are done as part of routine exams. You may also have this test when you get your driver's license or apply for some types of jobs.  Visual field tests  These tests are used:  To check for vision loss in any area of your range of vision.  To screen for certain eye diseases.  To look for nerve damage after a stroke, head injury, or other problem that could reduce blood flow to the brain.  Refraction and color tests  A refraction test is done to find the right prescription for glasses and contact lenses.  A color vision test is done to check for color blindness.  Color vision is often tested as part of a routine exam. You may also have this test when you apply for a job where recognizing different colors is important, such as truck driving, optician, dispensing, or the [employer].  How are vision tests done?  Visual acuity test   You cover one eye at a time.  You read aloud from a wall chart across the room.  You read aloud from a small card that you hold in your hand.  Refraction   You look into a special device.  The device puts lenses of different strengths in front of each eye to see how strong your glasses or contact lenses need to be.  Visual field tests   Your doctor may have you look through special machines.  Or your doctor may simply have you stare straight ahead while they move a finger into and out of your field of vision.  Color vision test   You look at pieces of printed test patterns in various colors. You say what  number or symbol you see.  Your doctor may have you trace the number or symbol using a pointer.  How do these tests feel?  There is very little chance of having a problem from this test. If dilating drops are used for a vision test, they may make the eyes sting and cause a medicine taste in the mouth.  Follow-up care is a key part of your  treatment and safety. Be sure to make and go to all appointments, and call your doctor if you are having problems. It's also a good idea to know your test results and keep a list of the medicines you take.  Where can you learn more?  Go to [employer] and enter G551 to learn more about Learning About Vision Tests.  Current as of: October 26, 2022  Content Version: 14.6   2024-2025 [place], [place].   Care instructions adapted under license by [facility]. If you have questions about a medical condition or this instruction, always ask your healthcare professional. Romayne Alderman, [facility], disclaims any warranty or liability for your use of this information.         Learning About Getting In and Out of a Bathtub Safely  Introduction  Many falls happen during bathing. All that water makes the bathroom a slippery place.  You may no longer be able to step over the tub wall comfortably and safely.  You might lean on things that aren't meant to support your weight, like a towel bar or the shower curtain.  There are several types of aids that will help keep you safe. You can buy them at hardware or home improvement stores or online.  What tools can help you in a bathtub?  Tub rail and grab bar    There are many types of bars and rails that can be installed on the walls next to your tub or on the edge of the tub. They can help keep you safe while you're getting in or out of the tub. It's important to have them permanently installed, rather than attached with suction.  Transfer bench    There are several kinds of benches or seats to use in the bathtub. One type  sits in the tub and extends out over the side. You sit on the bench. Then you lift one leg at a time into the tub, sliding your body over as you do so. Another common tub bench sits inside the tub. To use this type, you need to be able to safely step into the tub before you sit down.  Nonskid mats    Water makes both the floor of the tub and the bathroom floor slippery. You can buy adhesive strips that stick to the floor of the tub. Or you can use a nonskid tub mat. Outside the tub, make sure you use a rug with a nonskid bottom. Don't use a plain towel.  Hand-held shower head    With a hand-held shower head, you can get clean without having to lower yourself into the tub. If your tub doesn't have a shower curtain, hanging one can keep water from spilling out onto the floor.  Follow-up care is a key part of your treatment and safety. Be sure to make and go to all appointments, and call your doctor if you are having problems. It's also a good idea to know your test results and keep a list of the medicines you take.  Current as of: October 26, 2022  Content Version: 14.6   2024-2025 [employer], [place].   Care instructions adapted under license by [facility]. If you have questions about a medical condition or this instruction, always ask your healthcare professional. Romayne Alderman, [facility], disclaims any warranty or liability for your use of this information.         Advance Directives: Care Instructions  Overview  An advance directive is a legal way to state  your wishes at the end of your life. It tells your loved ones and doctor what to do if you can't say what you want.  There are two main types of advance directives. You can change them any time your wishes change.  Living will. This form tells your loved ones and doctor your wishes about life support and other treatment. The form is also called a declaration.  Medical power of attorney. This form lets you name a person to make treatment decisions for you when  you can't speak for yourself. This person is called a health care agent (health care proxy, health care surrogate). The form is also called a durable power of attorney for health care.  If you do not have an advance directive, decisions about your medical care may be made by a family member or doctor who doesn't know you or by a judge.  It may help to think of an advance directive as a gift to the people who care for you. If you have one, they won't have to make tough decisions by themselves.  For more information, including forms for your state, see the [employer] website (plumberbiz.com.cy).  Follow-up care is a key part of your treatment and safety. Be sure to make and go to all appointments, and call your doctor if you are having problems. It's also a good idea to know your test results and keep a list of the medicines you take.  What should you include in an advance directive?  Many states have a unique advance directive form. (It may ask you to address specific issues.) Or you might use a universal form that's approved by many states.  If your form doesn't tell you what to address, it may be hard to know what to include in your advance directive. Use the questions below to help you get started.  Who do you want to make decisions about your medical care if you are not able to?  What life-support measures do you want if you have a serious illness that gets worse over time or can't be cured?  What are you most afraid of that might happen? (Maybe you're afraid of having pain, losing your independence, or being kept alive by machines.)  Where would you prefer to die? (Your home? A hospital? A nursing home?)  Do you want to donate your organs when you die?  Do you want certain religious practices performed before you die?  When should you call for help?  Be sure to contact your doctor if you have any questions.  Where can you learn more?  Go to [employer] and  enter R264 to learn more about Advance Directives: Care Instructions.  Current as of: September 26, 2023  Content Version: 14.6   2024-2025 [place], [place].   Care instructions adapted under license by [facility]. If you have questions about a medical condition or this instruction, always ask your healthcare professional. Romayne Alderman, [facility], disclaims any warranty or liability for your use of this information.         A Healthy Heart: Care Instructions  Overview    Coronary artery disease, also called heart disease, occurs when a substance called plaque builds up in the vessels that supply oxygen-rich blood to your heart muscle. This can narrow the blood vessels and reduce blood flow. A heart attack happens when blood flow is completely blocked. A high-fat diet, smoking, and other factors increase the risk of heart disease.  Your doctor has found that  you have a chance of having heart disease. A heart-healthy lifestyle can help keep your heart healthy and prevent heart disease. This lifestyle includes eating healthy, being active, staying at a weight that's healthy for you, and not smoking, vaping, or using other tobacco or nicotine products. It also includes taking medicines as directed, managing other health conditions, and trying to get a healthy amount of sleep.  Follow-up care is a key part of your treatment and safety. Be sure to make and go to all appointments, and contact your doctor if you are having problems. It's also a good idea to know your test results and keep a list of the medicines you take.  How can you care for yourself at home?  Diet  Use less salt when you cook and eat. This helps lower your blood pressure. Taste food before salting. Add only a little salt when you think you need it. With time, your taste buds will adjust to less salt.  Eat fewer snack items, fast foods, canned soups, and other high-salt, high-fat, processed foods.  Read food labels and try to avoid saturated and trans  fats. They increase your risk of heart disease by raising cholesterol levels.  Limit the amount of solid fat--butter, margarine, and shortening--you eat. Use olive, peanut, or canola oil when you cook. Bake, broil, and steam foods instead of frying them.  Eat a variety of fruit and vegetables every day. Dark green, deep orange, red, or yellow fruits and vegetables are especially good for you. Examples include spinach, carrots, peaches, and berries.  Foods high in fiber can reduce your cholesterol and provide important vitamins and minerals. High-fiber foods include whole-grain cereals and breads, oatmeal, beans, brown rice, citrus fruits, and apples.  Eat lean proteins. Heart-healthy proteins include seafood, lean meats and poultry, eggs, beans, peas, nuts, seeds, and soy products.  Limit drinks and foods with added sugar. These include candy, desserts, and soda pop.  Heart-healthy lifestyle  If your doctor recommends it, get more exercise. For many people, walking is a good choice. Or you may want to swim, bike, or do other activities. Bit by bit, increase the time you're active every day. Try for at least 30 minutes on most days of the week.  If you smoke, vape, or use other tobacco or nicotine products, try to quit. If you cant quit, cut back as much as you can. If you need help quitting, talk to your doctor about quit programs and medicines. Quitting is one of the most important things you can do to protect your heart. Also avoid secondhand smoke and the aerosol mist from vaping.  Stay at a weight that's healthy for you. Talk to your doctor if you need help losing weight.  Try to get 7 to 9 hours of sleep each night.  Limit alcohol to 2 drinks a day for men and 1 drink a day for women. Too much alcohol can cause health problems.  Manage other health problems such as diabetes, high blood pressure, and high cholesterol. If you think you may have a problem with alcohol or drug use, talk to your  doctor.  Medicines  Take your medicines exactly as prescribed. Contact your doctor if you think you are having a problem with your medicine.  When should you call for help?  Call 911 if you have symptoms of a heart attack. These may include:  Chest pain or pressure, or a strange feeling in the chest.  Sweating.  Shortness of breath.  Pain, pressure, or a strange feeling in the back, neck, jaw, or upper belly or in one or both shoulders or arms.  Lightheadedness or sudden weakness.  A fast or irregular heartbeat.  After you call 911, the operator may tell you to chew 1 adult-strength or 2 to 4 low-dose aspirin. Wait for an ambulance. Do not try to drive yourself.  Watch closely for changes in your health, and be sure to contact your doctor if you have any problems.  Where can you learn more?  Go to [employer] and enter F075 to learn more about A Healthy Heart: Care Instructions.  Current as of: October 26, 2022  Content Version: 14.6   2024-2025 [place], [place].   Care instructions adapted under license by [facility]. If you have questions about a medical condition or this instruction, always ask your healthcare professional. Romayne Alderman, [facility], disclaims any warranty or liability for your use of this information.    Personalized Preventive Plan for Dean Walker - 04/22/2024  Medicare offers a range of preventive health benefits. Some of the tests and screenings are paid in full while other may be subject to a deductible, co-insurance, and/or copay.  Some of these benefits include a comprehensive review of your medical history including lifestyle, illnesses that may run in your family, and various assessments and screenings as appropriate.  After reviewing your medical record and screening and assessments performed today your provider may have ordered immunizations, labs, imaging, and/or referrals for you.  A list of these orders (if applicable) as well as your Preventive  Care list are included within your After Visit Summary for your review.           Substance Use Disorder: Care Instructions  Overview     You can improve your life and health by stopping your use of alcohol or drugs. When you don't drink or use drugs, you may feel and sleep better. You may get along better with your family, friends, and coworkers. There are medicines and programs that can help with substance use disorder.  How can you care for yourself at home?  Here are some ways to help you stay sober and prevent relapse.  If you have been given medicine to help keep you sober or reduce your cravings, be sure to take it exactly as prescribed.  Talk to your doctor about programs that can help you stop using drugs or drinking alcohol.  Do not keep alcohol or drugs in your home.  Plan ahead. Think about what you'll say if other people ask you to drink or use drugs. Try not to spend time with people who drink or use drugs.  Use the time and money spent on drinking or drugs to do something that's important to you.  Preventing a relapse  Have a plan to deal with relapse. Learn to recognize changes in your thinking that lead you to drink or use drugs. Get help before you start to drink or use drugs again.  Try to stay away from situations, friends, or places that may lead you to drink or use drugs.  If you feel the need to drink alcohol or use drugs again, seek help right away. Call a trusted friend or family member. Some people get support from organizations such as Narcotics Anonymous or SMART Recovery or from treatment facilities.  If you relapse, get help as soon as you can. Some people make a plan with another person that outlines what they want that person  to do for them if they relapse. The plan usually includes how to handle the relapse and who to notify in case of relapse.  Don't give up. Remember that a relapse doesn't mean that you have failed. Use the experience to learn the triggers that lead you to drink or  use drugs. Then quit again. Recovery is a lifelong process. Many people have several relapses before they are able to quit for good.  Follow-up care is a key part of your treatment and safety. Be sure to make and go to all appointments, and call your doctor if you are having problems. It's also a good idea to know your test results and keep a list of the medicines you take.  When should you call for help?   Call 911  anytime you think you may need emergency care. For example, call if you or someone else:    Has overdosed or has withdrawal signs. Be sure to tell the emergency workers that you are or someone else is using or trying to quit using drugs. Overdose or withdrawal signs may include:  Losing consciousness.  Seizure.  Seeing or hearing things that aren't there (hallucinations).     Is thinking or talking about suicide or harming others.   Where to get help 24 hours a day, 7 days a week   If you or someone you know talks about suicide, self-harm, a mental health crisis, a substance use crisis, or any other kind of emotional distress, get help right away. You can:    Call the Suicide and Crisis Lifeline at 11.     Call 1-800-273-TALK (312-507-0595).     Text HOME to 620-735-7392 to access the Crisis Text Line.   Consider saving these numbers in your phone.  Go to 988lifeline.org for more information or to chat online.  Call your doctor now or seek immediate medical care if:    You are having withdrawal symptoms. These may include nausea or vomiting, sweating, shakiness, and anxiety.   Watch closely for changes in your health, and be sure to contact your doctor if:    You have a relapse.     You need more help or support to stop.   Where can you learn more?  Go to [employer] and enter H573 to learn more about Substance Use Disorder: Care Instructions.  Current as of: November 15, 2022  Content Version: 14.6   2024-2025 [place], [place].   Care instructions adapted under license by  [facility]. If you have questions about a medical condition or this instruction, always ask your healthcare professional. Romayne Alderman, [facility], disclaims any warranty or liability for your use of this information.         Learning About Being Physically Active  What is physical activity?     Being physically active means doing any kind of activity that gets your body moving.  The types of physical activity that can help you get fit and stay healthy include:  Aerobic or cardio activities. These make your heart beat faster and make you breathe harder, such as brisk walking, riding a bike, or running. They strengthen your heart and lungs and build up your endurance.  Strength training activities. These make your muscles work against, or resist, something. Examples include lifting weights or doing push-ups. These activities help tone and strengthen your muscles and bones.  Stretches. These let you move your joints and muscles through their full range of motion. Stretching helps you be more flexible.  Reaching a balance between these three types of physical activity is important because each one contributes to your overall fitness.  What are the benefits of being active?  Being active is one of the best things you can do for your health. It helps you to:  Feel stronger and have more energy to do all the things you like to do.  Focus better at school or work.  Feel, think, and sleep better.  Reach and stay at a healthy weight.  Lose fat and build lean muscle.  Lower your risk for serious health problems, including diabetes, heart attack, high blood pressure, and some cancers.  Keep your heart, lungs, bones, muscles, and joints strong and healthy.  How can you make being active part of your life?  Start slowly. Make it your long-term goal to get at least 30 minutes of exercise on most days of the week. Walking is a good choice. You also may want to do other activities, such as running, swimming, cycling, or playing  tennis or team sports.  Pick activities that you like--ones that make your heart beat faster, your muscles stronger, and your muscles and joints more flexible. If you find more than one thing you like doing, do them all. You don't have to do the same thing every day.  Get your heart pumping every day. Any activity that makes your heart beat faster and keeps it at that rate for a while counts.  Here are some great ways to get your heart beating faster:  Go for a brisk walk, run, or hike.  Go for a swim or bike ride.  Take an online exercise class or dance.  Play a game of touch football, basketball, or soccer.  Play tennis, pickleball, or racquetball.  Climb stairs.  Even some household chores can be aerobic. Just do them at a faster pace. Raking or mowing the lawn, sweeping the garage, and vacuuming and cleaning your home all can help get your heart rate up.  Strengthen your muscles during the week. You don't have to lift heavy weights or grow big, bulky muscles to get stronger. Doing a few simple activities that make your muscles work against, or resist, something can help you get stronger. Aim for at least twice a week.  For example, you can:  Do push-ups or sit-ups, which use your own body weight as resistance.  Lift weights or dumbbells or use stretch bands at home or in a gym or community center.  Stretch your muscles often. Stretching will help you as you become more active. It can help you stay flexible and loosen tight muscles. It can also help improve your balance and posture and can be a great way to relax.  Be sure to stretch the muscles you'll be using when you work out. It's best to warm your muscles slightly before you stretch them. Walk or do some other light aerobic activity for a few minutes. Then start stretching.  When you stretch your muscles:  Do it slowly. Stretching is not about going fast or making sudden movements.  Don't push or bounce during a stretch.  Hold each stretch for at least 15  to 30 seconds, if you can. You should feel a stretch in the muscle, but not pain.  Breathe out as you do the stretch. Then breathe in as you hold the stretch. Don't hold your breath.  If you're worried about how more activity might affect your health, have a checkup before you start. Follow  any special advice your doctor gives you for getting a smart start.  Where can you learn more?  Go to [employer] and enter W332 to learn more about Learning About Being Physically Active.  Current as of: October 26, 2022  Content Version: 14.6   2024-2025 [place], [place].   Care instructions adapted under license by [facility]. If you have questions about a medical condition or this instruction, always ask your healthcare professional. Romayne Alderman, [facility], disclaims any warranty or liability for your use of this information.         Learning About Vision Tests  What are vision tests?     The four most common vision tests are visual acuity tests, refraction, visual field tests, and color vision tests.  Visual acuity (sharpness) tests  These tests are used:  To see if you need glasses or contact lenses.  To monitor an eye problem.  To check an eye injury.  Visual acuity tests are done as part of routine exams. You may also have this test when you get your driver's license or apply for some types of jobs.  Visual field tests  These tests are used:  To check for vision loss in any area of your range of vision.  To screen for certain eye diseases.  To look for nerve damage after a stroke, head injury, or other problem that could reduce blood flow to the brain.  Refraction and color tests  A refraction test is done to find the right prescription for glasses and contact lenses.  A color vision test is done to check for color blindness.  Color vision is often tested as part of a routine exam. You may also have this test when you apply for a job where recognizing different colors is important, such as  truck driving, optician, dispensing, or the [employer].  How are vision tests done?  Visual acuity test   You cover one eye at a time.  You read aloud from a wall chart across the room.  You read aloud from a small card that you hold in your hand.  Refraction   You look into a special device.  The device puts lenses of different strengths in front of each eye to see how strong your glasses or contact lenses need to be.  Visual field tests   Your doctor may have you look through special machines.  Or your doctor may simply have you stare straight ahead while they move a finger into and out of your field of vision.  Color vision test   You look at pieces of printed test patterns in various colors. You say what number or symbol you see.  Your doctor may have you trace the number or symbol using a pointer.  How do these tests feel?  There is very little chance of having a problem from this test. If dilating drops are used for a vision test, they may make the eyes sting and cause a medicine taste in the mouth.  Follow-up care is a key part of your treatment and safety. Be sure to make and go to all appointments, and call your doctor if you are having problems. It's also a good idea to know your test results and keep a list of the medicines you take.  Where can you learn more?  Go to [employer] and enter G551 to learn more about Learning About Vision Tests.  Current as of: October 26, 2022  Content Version: 14.6   2024-2025 [employer]   Care instructions adapted under license by [facility]. If you have questions about a medical condition or this instruction, always ask your healthcare professional. Romayne Alderman, [facility], disclaims any warranty or liability for your use of this information.         Learning About Getting In and Out of a Bathtub Safely  Introduction  Many falls happen during bathing. All that water makes the bathroom a slippery place.  You may no longer be able to step over  the tub wall comfortably and safely.  You might lean on things that aren't meant to support your weight, like a towel bar or the shower curtain.  There are several types of aids that will help keep you safe. You can buy them at hardware or home improvement stores or online.  What tools can help you in a bathtub?  Tub rail and grab bar    There are many types of bars and rails that can be installed on the walls next to your tub or on the edge of the tub. They can help keep you safe while you're getting in or out of the tub. It's important to have them permanently installed, rather than attached with suction.  Transfer bench    There are several kinds of benches or seats to use in the bathtub. One type sits in the tub and extends out over the side. You sit on the bench. Then you lift one leg at a time into the tub, sliding your body over as you do so. Another common tub bench sits inside the tub. To use this type, you need to be able to safely step into the tub before you sit down.  Nonskid mats    Water makes both the floor of the tub and the bathroom floor slippery. You can buy adhesive strips that stick to the floor of the tub. Or you can use a nonskid tub mat. Outside the tub, make sure you use a rug with a nonskid bottom. Don't use a plain towel.  Hand-held shower head    With a hand-held shower head, you can get clean without having to lower yourself into the tub. If your tub doesn't have a shower curtain, hanging one can keep water from spilling out onto the floor.  Follow-up care is a key part of your treatment and safety. Be sure to make and go to all appointments, and call your doctor if you are having problems. It's also a good idea to know your test results and keep a list of the medicines you take.  Current as of: October 26, 2022  Content Version: 14.6   2024-2025 [place], [place].   Care instructions adapted under license by [facility]. If you have questions about a medical condition or this  instruction, always ask your healthcare professional. Romayne Alderman, [facility], disclaims any warranty or liability for your use of this information.         Advance Directives: Care Instructions  Overview  An advance directive is a legal way to state your wishes at the end of your life. It tells your loved ones and doctor what to do if you can't say what you want.  There are two main types of advance directives. You can change them any time your wishes change.  Living will. This form tells your loved ones and doctor your wishes about life support and other treatment. The form is also called a declaration.  Medical power of attorney. This  form lets you name a person to make treatment decisions for you when you can't speak for yourself. This person is called a health care agent (health care proxy, health care surrogate). The form is also called a durable power of attorney for health care.  If you do not have an advance directive, decisions about your medical care may be made by a family member or doctor who doesn't know you or by a judge.  It may help to think of an advance directive as a gift to the people who care for you. If you have one, they won't have to make tough decisions by themselves.  For more information, including forms for your state, see the [employer] website (plumber[employer].com.cy).  Follow-up care is a key part of your treatment and safety. Be sure to make and go to all appointments, and call your doctor if you are having problems. It's also a good idea to know your test results and keep a list of the medicines you take.  What should you include in an advance directive?  Many states have a unique advance directive form. (It may ask you to address specific issues.) Or you might use a universal form that's approved by many states.  If your form doesn't tell you what to address, it may be hard to know what to include in your advance directive. Use the questions below to help  you get started.  Who do you want to make decisions about your medical care if you are not able to?  What life-support measures do you want if you have a serious illness that gets worse over time or can't be cured?  What are you most afraid of that might happen? (Maybe you're afraid of having pain, losing your independence, or being kept alive by machines.)  Where would you prefer to die? (Your home? A hospital? A nursing home?)  Do you want to donate your organs when you die?  Do you want certain religious practices performed before you die?  When should you call for help?  Be sure to contact your doctor if you have any questions.  Where can you learn more?  Go to [employer] and enter R264 to learn more about Advance Directives: Care Instructions.  Current as of: September 26, 2023  Content Version: 14.6   2024-2025 [place], [place].   Care instructions adapted under license by [facility]. If you have questions about a medical condition or this instruction, always ask your healthcare professional. Romayne Alderman, [facility], disclaims any warranty or liability for your use of this information.         A Healthy Heart: Care Instructions  Overview    Coronary artery disease, also called heart disease, occurs when a substance called plaque builds up in the vessels that supply oxygen-rich blood to your heart muscle. This can narrow the blood vessels and reduce blood flow. A heart attack happens when blood flow is completely blocked. A high-fat diet, smoking, and other factors increase the risk of heart disease.  Your doctor has found that you have a chance of having heart disease. A heart-healthy lifestyle can help keep your heart healthy and prevent heart disease. This lifestyle includes eating healthy, being active, staying at a weight that's healthy for you, and not smoking, vaping, or using other tobacco or nicotine products. It also includes taking medicines as directed, managing other  health conditions, and trying to get a healthy amount of sleep.  Follow-up care is a key part of  your treatment and safety. Be sure to make and go to all appointments, and contact your doctor if you are having problems. It's also a good idea to know your test results and keep a list of the medicines you take.  How can you care for yourself at home?  Diet  Use less salt when you cook and eat. This helps lower your blood pressure. Taste food before salting. Add only a little salt when you think you need it. With time, your taste buds will adjust to less salt.  Eat fewer snack items, fast foods, canned soups, and other high-salt, high-fat, processed foods.  Read food labels and try to avoid saturated and trans fats. They increase your risk of heart disease by raising cholesterol levels.  Limit the amount of solid fat--butter, margarine, and shortening--you eat. Use olive, peanut, or canola oil when you cook. Bake, broil, and steam foods instead of frying them.  Eat a variety of fruit and vegetables every day. Dark green, deep orange, red, or yellow fruits and vegetables are especially good for you. Examples include spinach, carrots, peaches, and berries.  Foods high in fiber can reduce your cholesterol and provide important vitamins and minerals. High-fiber foods include whole-grain cereals and breads, oatmeal, beans, brown rice, citrus fruits, and apples.  Eat lean proteins. Heart-healthy proteins include seafood, lean meats and poultry, eggs, beans, peas, nuts, seeds, and soy products.  Limit drinks and foods with added sugar. These include candy, desserts, and soda pop.  Heart-healthy lifestyle  If your doctor recommends it, get more exercise. For many people, walking is a good choice. Or you may want to swim, bike, or do other activities. Bit by bit, increase the time you're active every day. Try for at least 30 minutes on most days of the week.  If you smoke, vape, or use other tobacco or nicotine products, try to  quit. If you cant quit, cut back as much as you can. If you need help quitting, talk to your doctor about quit programs and medicines. Quitting is one of the most important things you can do to protect your heart. Also avoid secondhand smoke and the aerosol mist from vaping.  Stay at a weight that's healthy for you. Talk to your doctor if you need help losing weight.  Try to get 7 to 9 hours of sleep each night.  Limit alcohol to 2 drinks a day for men and 1 drink a day for women. Too much alcohol can cause health problems.  Manage other health problems such as diabetes, high blood pressure, and high cholesterol. If you think you may have a problem with alcohol or drug use, talk to your doctor.  Medicines  Take your medicines exactly as prescribed. Contact your doctor if you think you are having a problem with your medicine.  When should you call for help?  Call 911 if you have symptoms of a heart attack. These may include:  Chest pain or pressure, or a strange feeling in the chest.  Sweating.  Shortness of breath.  Pain, pressure, or a strange feeling in the back, neck, jaw, or upper belly or in one or both shoulders or arms.  Lightheadedness or sudden weakness.  A fast or irregular heartbeat.  After you call 911, the operator may tell you to chew 1 adult-strength or 2 to 4 low-dose aspirin. Wait for an ambulance. Do not try to drive yourself.  Watch closely for changes in your health, and be sure to contact  your doctor if you have any problems.  Where can you learn more?  Go to [employer] and enter F075 to learn more about A Healthy Heart: Care Instructions.  Current as of: October 26, 2022  Content Version: 14.6   2024-2025 [place], [place].   Care instructions adapted under license by [facility]. If you have questions about a medical condition or this instruction, always ask your healthcare professional. Romayne Alderman, [facility], disclaims any warranty or liability for your use  of this information.    Personalized Preventive Plan for Dean Walker - 04/22/2024  Medicare offers a range of preventive health benefits. Some of the tests and screenings are paid in full while other may be subject to a deductible, co-insurance, and/or copay.  Some of these benefits include a comprehensive review of your medical history including lifestyle, illnesses that may run in your family, and various assessments and screenings as appropriate.  After reviewing your medical record and screening and assessments performed today your provider may have ordered immunizations, labs, imaging, and/or referrals for you.  A list of these orders (if applicable) as well as your Preventive Care list are included within your After Visit Summary for your review.

## 2024-04-24 MED ORDER — ROSUVASTATIN CALCIUM 5 MG PO TABS
5 | ORAL_TABLET | Freq: Every day | ORAL | 2 refills | Status: AC
Start: 2024-04-24 — End: ?
